# Patient Record
Sex: Female | Born: 1990
Health system: Southern US, Community
[De-identification: ages and names within clinical notes are randomized; demographics above are authoritative.]

## PROBLEM LIST (undated history)

## (undated) DIAGNOSIS — J069 Acute upper respiratory infection, unspecified: Secondary | ICD-10-CM

## (undated) DIAGNOSIS — Z8042 Family history of malignant neoplasm of prostate: Secondary | ICD-10-CM

## (undated) DIAGNOSIS — J09X2 Influenza due to identified novel influenza A virus with other respiratory manifestations: Secondary | ICD-10-CM

## (undated) DIAGNOSIS — F4310 Post-traumatic stress disorder, unspecified: Secondary | ICD-10-CM

## (undated) DIAGNOSIS — F909 Attention-deficit hyperactivity disorder, unspecified type: Secondary | ICD-10-CM

## (undated) DIAGNOSIS — M069 Rheumatoid arthritis, unspecified: Secondary | ICD-10-CM

## (undated) DIAGNOSIS — J302 Other seasonal allergic rhinitis: Secondary | ICD-10-CM

## (undated) DIAGNOSIS — Z800 Family history of malignant neoplasm of digestive organs: Secondary | ICD-10-CM

## (undated) DIAGNOSIS — L509 Urticaria, unspecified: Secondary | ICD-10-CM

## (undated) DIAGNOSIS — R42 Dizziness and giddiness: Secondary | ICD-10-CM

## (undated) DIAGNOSIS — Z808 Family history of malignant neoplasm of other organs or systems: Secondary | ICD-10-CM

## (undated) DIAGNOSIS — F32A Depression, unspecified: Secondary | ICD-10-CM

## (undated) DIAGNOSIS — F329 Major depressive disorder, single episode, unspecified: Secondary | ICD-10-CM

## (undated) DIAGNOSIS — L309 Dermatitis, unspecified: Secondary | ICD-10-CM

## (undated) DIAGNOSIS — G43909 Migraine, unspecified, not intractable, without status migrainosus: Secondary | ICD-10-CM

## (undated) DIAGNOSIS — Z803 Family history of malignant neoplasm of breast: Secondary | ICD-10-CM

## (undated) HISTORY — DX: Post-traumatic stress disorder, unspecified: F43.10

## (undated) HISTORY — DX: Acute upper respiratory infection, unspecified: J06.9

## (undated) HISTORY — DX: Depression, unspecified: F32.A

## (undated) HISTORY — DX: Urticaria, unspecified: L50.9

## (undated) HISTORY — DX: Dermatitis, unspecified: L30.9

## (undated) HISTORY — PX: ORIF DISTAL RADIUS FRACTURE: SUR927

## (undated) HISTORY — DX: Family history of malignant neoplasm of other organs or systems: Z80.8

## (undated) HISTORY — DX: Family history of malignant neoplasm of digestive organs: Z80.0

## (undated) HISTORY — PX: WISDOM TOOTH EXTRACTION: SHX21

## (undated) HISTORY — DX: Family history of malignant neoplasm of prostate: Z80.42

## (undated) HISTORY — DX: Family history of malignant neoplasm of breast: Z80.3

## (undated) HISTORY — DX: Influenza due to identified novel influenza A virus with other respiratory manifestations: J09.X2

## (undated) HISTORY — DX: Attention-deficit hyperactivity disorder, unspecified type: F90.9

## (undated) HISTORY — PX: OTHER SURGICAL HISTORY: SHX169

---
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
## 2008-08-05 ENCOUNTER — Emergency Department (HOSPITAL_COMMUNITY): Admission: EM | Admit: 2008-08-05 | Discharge: 2008-08-05 | Payer: Self-pay | Admitting: Family Medicine

## 2009-10-20 ENCOUNTER — Encounter: Admission: RE | Admit: 2009-10-20 | Discharge: 2009-10-20 | Payer: Self-pay | Admitting: Family Medicine

## 2009-11-25 ENCOUNTER — Emergency Department (HOSPITAL_COMMUNITY): Admission: EM | Admit: 2009-11-25 | Discharge: 2009-11-25 | Payer: Self-pay | Admitting: Family Medicine

## 2010-02-11 ENCOUNTER — Emergency Department (HOSPITAL_COMMUNITY)
Admission: EM | Admit: 2010-02-11 | Discharge: 2010-02-11 | Payer: Self-pay | Source: Home / Self Care | Admitting: Family Medicine

## 2010-09-11 LAB — CBC
HCT: 41.1 % (ref 36.0–46.0)
Hemoglobin: 13.2 g/dL (ref 12.0–15.0)
MCH: 27.8 pg (ref 26.0–34.0)
MCHC: 32.1 g/dL (ref 30.0–36.0)
MCV: 86.5 fL (ref 78.0–100.0)
Platelets: 305 10*3/uL (ref 150–400)
RBC: 4.75 MIL/uL (ref 3.87–5.11)
RDW: 12.9 % (ref 11.5–15.5)
WBC: 12.3 10*3/uL — ABNORMAL HIGH (ref 4.0–10.5)

## 2010-09-11 LAB — DIFFERENTIAL
Basophils Absolute: 0 10*3/uL (ref 0.0–0.1)
Basophils Relative: 0 % (ref 0–1)
Eosinophils Absolute: 0.1 10*3/uL (ref 0.0–0.7)
Eosinophils Relative: 1 % (ref 0–5)
Lymphocytes Relative: 24 % (ref 12–46)
Lymphs Abs: 2.9 10*3/uL (ref 0.7–4.0)
Monocytes Absolute: 0.7 10*3/uL (ref 0.1–1.0)
Monocytes Relative: 6 % (ref 3–12)
Neutro Abs: 8.5 10*3/uL — ABNORMAL HIGH (ref 1.7–7.7)
Neutrophils Relative %: 69 % (ref 43–77)

## 2010-09-11 LAB — POCT RAPID STREP A (OFFICE): Streptococcus, Group A Screen (Direct): NEGATIVE

## 2010-09-11 LAB — STREP A DNA PROBE: Group A Strep Probe: NEGATIVE

## 2010-09-11 LAB — POCT INFECTIOUS MONO SCREEN: Mono Screen: NEGATIVE

## 2010-10-13 LAB — GC/CHLAMYDIA PROBE AMP, GENITAL
Chlamydia, DNA Probe: NEGATIVE
GC Probe Amp, Genital: NEGATIVE

## 2010-10-13 LAB — WET PREP, GENITAL
Clue Cells Wet Prep HPF POC: NONE SEEN
Trich, Wet Prep: NONE SEEN
Yeast Wet Prep HPF POC: NONE SEEN

## 2010-10-13 LAB — POCT URINALYSIS DIP (DEVICE)
Bilirubin Urine: NEGATIVE
Glucose, UA: NEGATIVE mg/dL
Hgb urine dipstick: NEGATIVE
Ketones, ur: NEGATIVE mg/dL
Nitrite: NEGATIVE
Protein, ur: NEGATIVE mg/dL
Specific Gravity, Urine: 1.01 (ref 1.005–1.030)
Urobilinogen, UA: 0.2 mg/dL (ref 0.0–1.0)
pH: 7 (ref 5.0–8.0)

## 2010-10-13 LAB — POCT PREGNANCY, URINE: Preg Test, Ur: NEGATIVE

## 2011-03-27 ENCOUNTER — Inpatient Hospital Stay (INDEPENDENT_AMBULATORY_CARE_PROVIDER_SITE_OTHER)
Admission: RE | Admit: 2011-03-27 | Discharge: 2011-03-27 | Disposition: A | Discharge status: Home / Self Care | Payer: PRIVATE HEALTH INSURANCE | Source: Ambulatory Visit | Attending: Emergency Medicine | Admitting: Emergency Medicine

## 2011-03-27 ENCOUNTER — Ambulatory Visit (INDEPENDENT_AMBULATORY_CARE_PROVIDER_SITE_OTHER): Payer: PRIVATE HEALTH INSURANCE

## 2011-03-27 DIAGNOSIS — J189 Pneumonia, unspecified organism: Secondary | ICD-10-CM

## 2011-10-27 ENCOUNTER — Encounter: Payer: Self-pay | Admitting: Internal Medicine

## 2011-11-10 ENCOUNTER — Ambulatory Visit (INDEPENDENT_AMBULATORY_CARE_PROVIDER_SITE_OTHER): Payer: PRIVATE HEALTH INSURANCE | Admitting: Gynecology

## 2011-11-10 ENCOUNTER — Encounter: Payer: Self-pay | Admitting: Gynecology

## 2011-11-10 VITALS — BP 116/72 | Ht 60.5 in | Wt 123.5 lb

## 2011-11-10 DIAGNOSIS — N912 Amenorrhea, unspecified: Secondary | ICD-10-CM

## 2011-11-10 DIAGNOSIS — Z309 Encounter for contraceptive management, unspecified: Secondary | ICD-10-CM

## 2011-11-10 NOTE — Progress Notes (Signed)
Patient is a 21 year old recent grad from [employer] who presented to the office today to discuss different contraception options. She was interested in the nexplanon. She had been currently on the Aviane oral contraceptive pill but she states that she has issues with remembering. Patient is otherwise been in good health denies smoking or any prior history of STDs. Her Pap smear was done less than 6 months ago was reportedly normal.  The following contraceptive methods were discussed with this risk benefits pros and cons and failure rates: 1. Continues oral contraceptive pill 2. NuvaRing 3 Depo-Provera injection 4. Nexplanon 5. IUD (Mirena, [place], [place] T380A)  Literature information was provided on the nexplanon and she will wait to start of her next cycle to have it placed. She did come off of the oral contraceptive pill on the third day of her recent pack and was given by the provider at the [facility] Provera 10 mg for 5-7 days to start her cycle.

## 2011-11-10 NOTE — Patient Instructions (Signed)
Contraception Choices Contraception (birth control) is the use of any methods or devices to prevent pregnancy. Below are some methods to help avoid pregnancy. HORMONAL METHODS   Contraceptive implant. This is a thin, plastic tube containing progesterone hormone. It does not contain estrogen hormone. Your caregiver inserts the tube in the inner part of the upper arm. The tube can remain in place for up to 3 years. After 3 years, the implant must be removed. The implant prevents the ovaries from releasing an egg (ovulation), thickens the cervical mucus which prevents sperm from entering the uterus, and thins the lining of the inside of the uterus.   Progesterone-only injections. These injections are given every 3 months by your caregiver to prevent pregnancy. This synthetic progesterone hormone stops the ovaries from releasing eggs. It also thickens cervical mucus and changes the uterine lining. This makes it harder for sperm to survive in the uterus.   Birth control pills. These pills contain estrogen and progesterone hormone. They work by stopping the egg from forming in the ovary (ovulation). Birth control pills are prescribed by a caregiver.Birth control pills can also be used to treat heavy periods.   Minipill. This type of birth control pill contains only the progesterone hormone. They are taken every day of each month and must be prescribed by your caregiver.   Birth control patch. The patch contains hormones similar to those in birth control pills. It must be changed once a week and is prescribed by a caregiver.   Vaginal ring. The ring contains hormones similar to those in birth control pills. It is left in the vagina for 3 weeks, removed for 1 week, and then a new one is put back in place. The patient must be comfortable inserting and removing the ring from the vagina.A caregiver's prescription is necessary.   Emergency contraception. Emergency contraceptives prevent pregnancy after  unprotected sexual intercourse. This pill can be taken right after sex or up to 5 days after unprotected sex. It is most effective the sooner you take the pills after having sexual intercourse. Emergency contraceptive pills are available without a prescription. Check with your pharmacist. Do not use emergency contraception as your only form of birth control.  BARRIER METHODS   Female condom. This is a thin sheath (latex or rubber) that is worn over the penis during sexual intercourse. It can be used with spermicide to increase effectiveness.   Female condom. This is a soft, loose-fitting sheath that is put into the vagina before sexual intercourse.   Diaphragm. This is a soft, latex, dome-shaped barrier that must be fitted by a caregiver. It is inserted into the vagina, along with a spermicidal jelly. It is inserted before intercourse. The diaphragm should be left in the vagina for 6 to 8 hours after intercourse.   Cervical cap. This is a round, soft, latex or plastic cup that fits over the cervix and must be fitted by a caregiver. The cap can be left in place for up to 48 hours after intercourse.   Sponge. This is a soft, circular piece of polyurethane foam. The sponge has spermicide in it. It is inserted into the vagina after wetting it and before sexual intercourse.   Spermicides. These are chemicals that kill or block sperm from entering the cervix and uterus. They come in the form of creams, jellies, suppositories, foam, or tablets. They do not require a prescription. They are inserted into the vagina with an applicator before having sexual intercourse. The process must be   repeated every time you have sexual intercourse.  INTRAUTERINE CONTRACEPTION  Intrauterine device (IUD). This is a T-shaped device that is put in a woman's uterus during a menstrual period to prevent pregnancy. There are 2 types:   Copper IUD. This type of IUD is wrapped in copper wire and is placed inside the uterus. Copper  makes the uterus and fallopian tubes produce a fluid that kills sperm. It can stay in place for 10 years.   Hormone IUD. This type of IUD contains the hormone progestin (synthetic progesterone). The hormone thickens the cervical mucus and prevents sperm from entering the uterus, and it also thins the uterine lining to prevent implantation of a fertilized egg. The hormone can weaken or kill the sperm that get into the uterus. It can stay in place for 5 years.  PERMANENT METHODS OF CONTRACEPTION  Female tubal ligation. This is when the woman's fallopian tubes are surgically sealed, tied, or blocked to prevent the egg from traveling to the uterus.   Female sterilization. This is when the female has the tubes that carry sperm tied off (vasectomy).This blocks sperm from entering the vagina during sexual intercourse. After the procedure, the man can still ejaculate fluid (semen).  NATURAL PLANNING METHODS  Natural family planning. This is not having sexual intercourse or using a barrier method (condom, diaphragm, cervical cap) on days the woman could become pregnant.   Calendar method. This is keeping track of the length of each menstrual cycle and identifying when you are fertile.   Ovulation method. This is avoiding sexual intercourse during ovulation.   Symptothermal method. This is avoiding sexual intercourse during ovulation, using a thermometer and ovulation symptoms.   Post-ovulation method. This is timing sexual intercourse after you have ovulated.  Regardless of which type or method of contraception you choose, it is important that you use condoms to protect against the transmission of sexually transmitted diseases (STDs). Talk with your caregiver about which form of contraception is most appropriate for you. Document Released: 06/14/2005 Document Revised: 06/03/2011 Document Reviewed: 10/21/2010 [employer] Patient Information 2012 [employer]. 

## 2011-11-18 ENCOUNTER — Telehealth: Payer: Self-pay | Admitting: *Deleted

## 2011-11-18 NOTE — Telephone Encounter (Signed)
Pt called asking if all right not to have a period 5 days after finishing provera. I advised to give it 2 weeks from last pill to see if it comes. Pt will c/b if still no period. KW

## 2012-05-23 ENCOUNTER — Emergency Department: Payer: Self-pay | Admitting: Emergency Medicine

## 2013-02-06 ENCOUNTER — Other Ambulatory Visit (HOSPITAL_COMMUNITY)
Admission: RE | Admit: 2013-02-06 | Discharge: 2013-02-06 | Disposition: A | Discharge status: Home / Self Care | Payer: BC Managed Care – PPO | Source: Ambulatory Visit | Attending: Obstetrics and Gynecology | Admitting: Obstetrics and Gynecology

## 2013-02-06 ENCOUNTER — Other Ambulatory Visit: Payer: Self-pay | Admitting: Nurse Practitioner

## 2013-02-06 DIAGNOSIS — Z01419 Encounter for gynecological examination (general) (routine) without abnormal findings: Secondary | ICD-10-CM | POA: Insufficient documentation

## 2013-03-04 ENCOUNTER — Emergency Department
Admission: EM | Admit: 2013-03-04 | Discharge: 2013-03-04 | Disposition: A | Discharge status: Home / Self Care | Payer: BC Managed Care – PPO | Source: Home / Self Care | Attending: Family Medicine | Admitting: Family Medicine

## 2013-03-04 ENCOUNTER — Encounter: Payer: Self-pay | Admitting: *Deleted

## 2013-03-04 DIAGNOSIS — J069 Acute upper respiratory infection, unspecified: Secondary | ICD-10-CM

## 2013-03-04 DIAGNOSIS — R062 Wheezing: Secondary | ICD-10-CM

## 2013-03-04 LAB — POCT RAPID STREP A (OFFICE): Rapid Strep A Screen: NEGATIVE

## 2013-03-04 MED ORDER — METHYLPREDNISOLONE ACETATE 80 MG/ML IJ SUSP
80.0000 mg | Freq: Once | INTRAMUSCULAR | Status: AC
  Administered 2013-03-04: 80 mg via INTRAMUSCULAR

## 2013-03-04 MED ORDER — ALBUTEROL SULFATE HFA 108 (90 BASE) MCG/ACT IN AERS: 2.0000 | INHALATION_SPRAY | RESPIRATORY_TRACT | Status: DC | PRN

## 2013-03-04 NOTE — ED Notes (Signed)
Patient c/o sore throat, nasal congestion, and body aches x 2 days. Has tried OTC Benadryl, Ibuprofen, Nyquil and Dayquil with no relief.

## 2013-03-04 NOTE — ED Provider Notes (Signed)
CSN: [id number]     Arrival date & time 03/04/13  1507 History   First MD Initiated Contact with Patient 03/04/13 1527     Chief Complaint  Patient presents with  . Nasal Congestion  . Sore Throat    HPI  URI Symptoms Onset: 2-3 days  Description: rhinorrhea, nasal congestion, cough, wheezing  Modifying factors:  Baseline asthma, feels like she may need to use her inhaler  Symptoms Nasal discharge: yes Fever: mp Sore throat: yes Cough: yes Wheezing: yes Ear pain: no GI symptoms: no Sick contacts: no  Red Flags  Stiff neck: no Dyspnea: chest tightness Rash: no Swallowing difficulty: no  Sinusitis Risk Factors Headache/face pain: no Double sickening: no tooth pain: no  Allergy Risk Factors Sneezing: no Itchy scratchy throat: no Seasonal symptoms: no  Flu Risk Factors Headache: no muscle aches: no severe fatigue: no    Past Medical History  Diagnosis Date  . Swine flu 2009  . Asthma    Past Surgical History  Procedure Laterality Date  . Appendectomy  1996   Family History  Problem Relation Age of Onset  . Arrhythmia Brother     half-brother  . Diabetes Maternal Aunt   . Cancer Maternal Grandmother     SKIN  . Cancer Maternal Grandfather     SKIN  . Hypertension Paternal Grandmother   . Hypertension Paternal Grandfather   . Cancer Paternal Grandfather     MELANOMA   History  Substance Use Topics  . Smoking status: Current Some Day Smoker  . Smokeless tobacco: Never Used  . Alcohol Use: Yes     Comment: rare   OB History   Grav Para Term Preterm Abortions TAB SAB Ect Mult Living   0              Review of Systems  All other systems reviewed and are negative.    Allergies  Azithromycin  Home Medications   Current Outpatient Rx  Name  Route  Sig  Dispense  Refill  . albuterol (PROVENTIL) (2.5 MG/3ML) 0.083% nebulizer solution   Nebulization   Take 2.5 mg by nebulization every 6 (six) hours as needed for wheezing.         Marland Kitchen  amphetamine-dextroamphetamine (ADDERALL XR) 10 MG 24 hr capsule   Oral   Take 10 mg by mouth every morning.         . etonogestrel (NEXPLANON) 68 MG IMPL implant   Subcutaneous   Inject 1 each into the skin once.         Marland Kitchen levonorgestrel-ethinyl estradiol (AVIANE,ALESSE,LESSINA) 0.1-20 MG-MCG tablet   Oral   Take 1 tablet by mouth daily.          BP 101/66  Pulse 67  Temp(Src) 98 F (36.7 C) (Oral)  Resp 16  Ht 5\' 1"  (1.549 m)  Wt 121 lb (54.885 kg)  BMI 22.87 kg/m2  SpO2 98% Physical Exam  Constitutional: She appears well-developed and well-nourished.  HENT:  Head: Normocephalic and atraumatic.  Right Ear: External ear normal.  Left Ear: External ear normal.  +nasal erythema, rhinorrhea bilaterally, + post oropharyngeal erythema    Eyes: Conjunctivae are normal. Pupils are equal, round, and reactive to light.  Neck: Normal range of motion. Neck supple.  Cardiovascular: Normal rate, regular rhythm and normal heart sounds.   Pulmonary/Chest: Effort normal and breath sounds normal.  Abdominal: Soft. Bowel sounds are normal.  Musculoskeletal: Normal range of motion.  Neurological: She is alert.  Skin:  Skin is warm.    ED Course  Procedures (including critical care time) Labs Review Labs Reviewed  POCT RAPID STREP A (OFFICE)   Imaging Review No results found.  MDM   1. URI (upper respiratory infection)   2. Wheezing    Likely viral process  Rapid strep negative.  Depomedrol 80mg  IMx1 for wheezing Albuterol inhaler refilled.  Discussed supportive care and infectious/resp red flags.  Follow up as needed.    The patient and/or caregiver has been counseled thoroughly with regard to treatment plan and/or medications prescribed including dosage, schedule, interactions, rationale for use, and possible side effects and they verbalize understanding. Diagnoses and expected course of recovery discussed and will return if not improved as expected or if the  condition worsens. Patient and/or caregiver verbalized understanding.         Doree Albee, MD 03/04/13 [phone]

## 2013-05-07 ENCOUNTER — Emergency Department
Admission: EM | Admit: 2013-05-07 | Discharge: 2013-05-07 | Disposition: A | Discharge status: Home / Self Care | Payer: BC Managed Care – PPO | Source: Home / Self Care | Attending: Family Medicine | Admitting: Family Medicine

## 2013-05-07 ENCOUNTER — Encounter: Payer: Self-pay | Admitting: Emergency Medicine

## 2013-05-07 DIAGNOSIS — R1031 Right lower quadrant pain: Secondary | ICD-10-CM

## 2013-05-07 DIAGNOSIS — R1032 Left lower quadrant pain: Secondary | ICD-10-CM

## 2013-05-07 DIAGNOSIS — G8929 Other chronic pain: Secondary | ICD-10-CM

## 2013-05-07 DIAGNOSIS — Z0000 Encounter for general adult medical examination without abnormal findings: Secondary | ICD-10-CM

## 2013-05-07 LAB — POCT URINALYSIS DIP (MANUAL ENTRY)
Bilirubin, UA: NEGATIVE
Blood, UA: NEGATIVE
Glucose, UA: NEGATIVE
Ketones, POC UA: NEGATIVE
Nitrite, UA: NEGATIVE
Protein Ur, POC: NEGATIVE
Spec Grav, UA: 1.015 (ref 1.005–1.03)
Urobilinogen, UA: 0.2 (ref 0–1)
pH, UA: 7 (ref 5–8)

## 2013-05-07 LAB — POCT CBC W AUTO DIFF (K'VILLE URGENT CARE)

## 2013-05-07 LAB — POCT URINE PREGNANCY: Preg Test, Ur: NEGATIVE

## 2013-05-07 MED ORDER — INFLUENZA VAC SPLIT QUAD 0.5 ML IM SUSP
0.5000 mL | Freq: Once | INTRAMUSCULAR | Status: AC
  Administered 2013-05-07: 0.5 mL via INTRAMUSCULAR

## 2013-05-07 NOTE — ED Notes (Signed)
Barbara Sellers c/o intermittent pain that starts low in her back  And radiates to her colon. She describes it as a "tearing" feeling. This started about 6 years ago but only occurred about once every 6 months. It has become more frequent over time and is now occuring about 1/week. The pain is "debilitating" when it occurs, about a 9/10. She denies any blood in her stools, BM are regular. She lifts weights but this started before she ever started lifting weights.

## 2013-05-07 NOTE — ED Provider Notes (Addendum)
CSN: [id number]     Arrival date & time 05/07/13  1531 History   First MD Initiated Contact with Patient 05/07/13 1717     Chief Complaint  Patient presents with  . GI Problem      HPI Comments: Patient complains of a 6 year history of randomly occurring abdominal pain which she experiences in her colon.  Initially the pain was brief and described as "tearing."  The pain has gradually increased in frequency and intensity.  For the past 3 days her pain has generally been constant.  When the pain is most severe she has nausea, but no vomiting.  She is unable to eat when the pain occurs, and nothing relieves the pain.  The pain often awakens her from sleep.  No fever, but she has had chills during the past 3 days.  When the pain occurs she is unable to perform any physical activities.  There have been no changes in her bowel movements.  No urinary symptoms.  She states that she has had about 6 pound weight loss over the past two weeks because she does not feel like eating when she has the pain. She presently has the Nexplanon implant which was placed about a month ago.  No vaginal discharge.  No recent vaginal bleeding.  She states that her GYN exams have been normal. Past surgical history:  Appendectomy Family history:  Father has IBS, and also had colonic polyps resected 6 years ago.  Patient is a 22 y.o. female presenting with abdominal pain. The history is provided by the patient.  Abdominal Pain This is a chronic problem. Episode onset: 6 years ago. The problem occurs daily. The problem has been gradually worsening. Associated symptoms include abdominal pain. Pertinent negatives include no chest pain, no headaches and no shortness of breath. The symptoms are aggravated by eating and exertion. Nothing relieves the symptoms. She has tried nothing for the symptoms.    Past Medical History  Diagnosis Date  . Swine flu 2009  . Asthma    Past Surgical History  Procedure Laterality Date  .  Appendectomy  1996   Family History  Problem Relation Age of Onset  . Arrhythmia Brother     half-brother  . Diabetes Maternal Aunt   . Cancer Maternal Grandmother     SKIN  . Cancer Maternal Grandfather     SKIN  . Hypertension Paternal Grandmother   . Hypertension Paternal Grandfather   . Cancer Paternal Grandfather     MELANOMA  . Colon polyps Father    History  Substance Use Topics  . Smoking status: Never Smoker   . Smokeless tobacco: Never Used  . Alcohol Use: Yes     Comment: rare   OB History   Grav Para Term Preterm Abortions TAB SAB Ect Mult Living   0              Review of Systems  Respiratory: Negative for shortness of breath.   Cardiovascular: Negative for chest pain.  Gastrointestinal: Positive for nausea and abdominal pain. Negative for vomiting, diarrhea, constipation, blood in stool, abdominal distention, anal bleeding and rectal pain.  Genitourinary: Negative for dysuria, urgency, frequency, vaginal discharge and vaginal pain.  Neurological: Negative for headaches.  All other systems reviewed and are negative.    Allergies  Azithromycin and Cayenne pepper  Home Medications   Current Outpatient Rx  Name  Route  Sig  Dispense  Refill  . albuterol (PROVENTIL HFA;VENTOLIN HFA) 108 (90 BASE)  MCG/ACT inhaler   Inhalation   Inhale 2 puffs into the lungs every 4 (four) hours as needed for wheezing (cough, shortness of breath or wheezing.).   1 Inhaler   1   . amphetamine-dextroamphetamine (ADDERALL XR) 10 MG 24 hr capsule   Oral   Take 10 mg by mouth every morning.         . etonogestrel (NEXPLANON) 68 MG IMPL implant   Subcutaneous   Inject 1 each into the skin once.          BP 105/66  Pulse 69  Temp(Src) 98 F (36.7 C) (Oral)  Resp 14  Ht 5\' 1"  (1.549 m)  Wt 119 lb (53.978 kg)  BMI 22.50 kg/m2  SpO2 99% Physical Exam Nursing notes and Vital Signs reviewed. Appearance:  Patient appears healthy, stated age, and in no acute  distress Eyes:  Pupils are equal, round, and reactive to light and accomodation.  Extraocular movement is intact.  Conjunctivae are not inflamed   Nose:  Normal turbinates.  Pharynx:  Normal Neck:  Supple.  No adenopathy Lungs:  Clear to auscultation.  Breath sounds are equal.  Heart:  Regular rate and rhythm without murmurs, rubs, or gallops.  Abdomen:   Tenderness over descending colon without masses or hepatosplenomegaly.  Bowel sounds are present and increased.  No CVA or flank tenderness.  Extremities:  No edema.  No calf tenderness Skin:  No rash present.   ED Course  Procedures      Labs Reviewed  URINE CULTURE No growth  AMYLASE 37 WNL  LIPASE 21 WNL  TSH 1.22 WNL  COMPLETE METABOLIC PANEL WITH GFR  POCT CBC W AUTO DIFF ([facility])  WBC 9.1; LY 45.5; MO 8.8; GR 45.7; Hgb 13.8; Platelets 236   POCT URINALYSIS DIP (MANUAL ENTRY):  LEU Trace, otherwise negative  POCT URINE PREGNANCY negative   Imaging Review Ct Abdomen Pelvis W Contrast  05/08/2013   CLINICAL DATA:  Lower abdominal pain, prior appendectomy  EXAM: CT ABDOMEN AND PELVIS WITH CONTRAST  TECHNIQUE: Multidetector CT imaging of the abdomen and pelvis was performed using the standard protocol following bolus administration of intravenous contrast.  CONTRAST:  OMNIPAQUE IOHEXOL 300 MG/ML  SOLN  COMPARISON:  None.  FINDINGS: Lung bases are clear.  Liver, spleen, pancreas, and adrenal glands are within normal limits.  Gallbladder is unremarkable. No intrahepatic or extrahepatic ductal dilatation.  Kidneys are within normal limits.  No hydronephrosis.  No evidence of bowel obstruction. Prior appendectomy. Left colon is decompressed.  No evidence of abdominal aortic aneurysm. Circumaortic left renal vein.  No abdominopelvic ascites.  No suspicious abdominopelvic lymphadenopathy.  Uterus and bilateral ovaries are unremarkable.  Bladder is within normal limits.  Visualized osseous structures are within normal limits.   IMPRESSION: Prior appendectomy.  Negative CT abdomen pelvis.   Electronically Signed   By: Charline Bills M.D.   On: 05/08/2013 16:00      MDM   1. Preventative health care:  Influenza vaccination   2. Abdominal pain, chronic, bilateral lower quadrant.  Normal white blood count is reassuring.  Note family history of colonic polyps    CMP, amylase, lipase, urine culture pending   Followup in 48 hours.     Lattie Haw, MD 05/09/13 [phone]  Addendum:  All studies WNL Suspect atypical irritable bowel syndrome.  However nocturnal pain and weight loss are worrisome.  Recommend colonoscopy. In interim, will begin cautious trial of low dose Paxil, 10mg  at  bedtime. Will arrange referral to [facility] in [place] for further evaluation  Lattie Haw, MD 05/09/13 [phone]

## 2013-05-08 ENCOUNTER — Ambulatory Visit (INDEPENDENT_AMBULATORY_CARE_PROVIDER_SITE_OTHER): Payer: BC Managed Care – PPO

## 2013-05-08 ENCOUNTER — Other Ambulatory Visit: Payer: Self-pay | Admitting: Family Medicine

## 2013-05-08 ENCOUNTER — Telehealth: Payer: Self-pay | Admitting: *Deleted

## 2013-05-08 DIAGNOSIS — R109 Unspecified abdominal pain: Secondary | ICD-10-CM

## 2013-05-08 LAB — COMPLETE METABOLIC PANEL WITH GFR
ALT: 13 U/L (ref 0–35)
AST: 20 U/L (ref 0–37)
Albumin: 5.1 g/dL (ref 3.5–5.2)
Alkaline Phosphatase: 67 U/L (ref 39–117)
BUN: 13 mg/dL (ref 6–23)
CO2: 30 mEq/L (ref 19–32)
Calcium: 10.3 mg/dL (ref 8.4–10.5)
Chloride: 101 mEq/L (ref 96–112)
Creat: 0.67 mg/dL (ref 0.50–1.10)
GFR, Est African American: >89 mL/min
GFR, Est Non African American: >89 mL/min
Glucose, Bld: 97 mg/dL (ref 70–99)
Potassium: 4.3 mEq/L (ref 3.5–5.3)
Sodium: 138 mEq/L (ref 135–145)
Total Bilirubin: 0.5 mg/dL (ref 0.3–1.2)
Total Protein: 7.9 g/dL (ref 6.0–8.3)

## 2013-05-08 LAB — TSH: TSH: 2.122 u[IU]/mL (ref 0.350–4.500)

## 2013-05-08 LAB — LIPASE: Lipase: 21 U/L (ref 0–75)

## 2013-05-08 LAB — AMYLASE: Amylase: 37 U/L (ref 0–105)

## 2013-05-08 MED ORDER — IOHEXOL 300 MG/ML  SOLN
100.0000 mL | Freq: Once | INTRAMUSCULAR | Status: AC | PRN
  Administered 2013-05-08: 100 mL via INTRAVENOUS

## 2013-05-09 ENCOUNTER — Telehealth: Payer: Self-pay | Admitting: *Deleted

## 2013-05-09 LAB — URINE CULTURE: Organism ID, Bacteria: NO GROWTH

## 2013-05-09 MED ORDER — PAROXETINE HCL 10 MG PO TABS: ORAL_TABLET | ORAL | Status: DC

## 2013-09-27 ENCOUNTER — Other Ambulatory Visit: Payer: Self-pay | Admitting: Emergency Medicine

## 2013-09-27 ENCOUNTER — Emergency Department
Admission: EM | Admit: 2013-09-27 | Discharge: 2013-09-27 | Disposition: A | Discharge status: Home / Self Care | Payer: BC Managed Care – PPO | Source: Home / Self Care | Attending: Emergency Medicine | Admitting: Emergency Medicine

## 2013-09-27 ENCOUNTER — Encounter: Payer: Self-pay | Admitting: Emergency Medicine

## 2013-09-27 DIAGNOSIS — J111 Influenza due to unidentified influenza virus with other respiratory manifestations: Secondary | ICD-10-CM

## 2013-09-27 DIAGNOSIS — J039 Acute tonsillitis, unspecified: Secondary | ICD-10-CM

## 2013-09-27 DIAGNOSIS — R509 Fever, unspecified: Secondary | ICD-10-CM

## 2013-09-27 DIAGNOSIS — J45909 Unspecified asthma, uncomplicated: Secondary | ICD-10-CM

## 2013-09-27 LAB — POCT RAPID STREP A (OFFICE): Rapid Strep A Screen: NEGATIVE

## 2013-09-27 LAB — POCT INFLUENZA A/B
Influenza A, POC: NEGATIVE
Influenza B, POC: NEGATIVE

## 2013-09-27 MED ORDER — AMOXICILLIN-POT CLAVULANATE 875-125 MG PO TABS: 1.0000 | ORAL_TABLET | Freq: Two times a day (BID) | ORAL | Status: DC

## 2013-09-27 MED ORDER — CEFTRIAXONE SODIUM 1 G IJ SOLR
1.0000 g | INTRAMUSCULAR | Status: AC
  Administered 2013-09-27: 1 g via INTRAMUSCULAR

## 2013-09-27 MED ORDER — ONDANSETRON 4 MG PO TBDP
4.0000 mg | ORAL_TABLET | ORAL | Status: AC
  Administered 2013-09-27: 4 mg via ORAL

## 2013-09-27 MED ORDER — ONDANSETRON 4 MG PO TBDP: 4.0000 mg | ORAL_TABLET | Freq: Three times a day (TID) | ORAL | Status: DC | PRN

## 2013-09-27 MED ORDER — OSELTAMIVIR PHOSPHATE 75 MG PO CAPS: ORAL_CAPSULE | ORAL | Status: DC

## 2013-09-27 MED ORDER — ALBUTEROL SULFATE HFA 108 (90 BASE) MCG/ACT IN AERS: 2.0000 | INHALATION_SPRAY | RESPIRATORY_TRACT | Status: DC | PRN

## 2013-09-27 MED ORDER — OSELTAMIVIR PHOSPHATE 75 MG PO CAPS
ORAL_CAPSULE | ORAL | Status: DC
Start: 2013-09-27 — End: 2013-09-27

## 2013-09-27 NOTE — ED Notes (Signed)
Barbara Sellers c/o body aches, sore throat, presenting with white spots on tonsils, cough and fever since yesterday. She has hx of ashtma and has had to use inhaler. Rec'd flu vac this season.

## 2013-09-27 NOTE — ED Provider Notes (Signed)
CSN: [id number]     Arrival date & time 09/27/13  1219 History   First MD Initiated Contact with Patient 09/27/13 1242     Chief Complaint  Patient presents with  . Sore Throat  . Generalized Body Aches    HPI FLU  HPI : Acute Flu symptoms for 1 day. Fever to 102 with chills, sweats, myalgias, fatigue, headache. Symptoms are progressively worsening, despite trying OTC fever reducing medicine and rest and fluids. Has decreased appetite, but tolerating some liquids by mouth. No history of recent tick bite. She has hx of mild asthma, with only occasional use for rescue inhaler as her baseline. Today, she had to use inhaler because of mild wheezing. Rec'd flu vac this season.   Review of Systems: Positive for fatigue, mild nasal congestion, severe sore throat, severe swollen anterior neck glands, +cough, occasionally productive of brown sputum. + severe nausea. + 2 loose, nonbloody stools today. Negative for acute vision changes, stiff neck, focal weakness, syncope, seizures, respiratory distress, vomiting, GU symptoms, new Rash.    Past Medical History  Diagnosis Date  . Swine flu 2009  . Asthma    Past Surgical History  Procedure Laterality Date  . Appendectomy  1996   Family History  Problem Relation Age of Onset  . Arrhythmia Brother     half-brother  . Diabetes Maternal Aunt   . Cancer Maternal Grandmother     SKIN  . Cancer Maternal Grandfather     SKIN  . Hypertension Paternal Grandmother   . Hypertension Paternal Grandfather   . Cancer Paternal Grandfather     MELANOMA  . Colon polyps Father    History  Substance Use Topics  . Smoking status: Never Smoker   . Smokeless tobacco: Never Used  . Alcohol Use: Yes     Comment: rare   OB History   Grav Para Term Preterm Abortions TAB SAB Ect Mult Living   0              Review of Systems  All other systems reviewed and are negative.    Allergies  Azithromycin and Cayenne pepper  Home Medications   Current  Outpatient Rx  Name  Route  Sig  Dispense  Refill  . albuterol (PROVENTIL HFA;VENTOLIN HFA) 108 (90 BASE) MCG/ACT inhaler   Inhalation   Inhale 2 puffs into the lungs every 4 (four) hours as needed for wheezing (cough, shortness of breath or wheezing.).   1 Inhaler   1   . amphetamine-dextroamphetamine (ADDERALL XR) 10 MG 24 hr capsule   Oral   Take 10 mg by mouth every morning.         . etonogestrel (NEXPLANON) 68 MG IMPL implant   Subcutaneous   Inject 1 each into the skin once.         Marland Kitchen. PARoxetine (PAXIL) 10 MG tablet      Take one tab by mouth daily at bedtime   15 tablet   1    BP 108/67  Pulse 97  Temp(Src) 100.5 F (38.1 C) (Oral)  Resp 14  Wt 119 lb (53.978 kg)  SpO2 96% Physical Exam  Nursing note and vitals reviewed. Constitutional: She appears well-developed and well-nourished.  Non-toxic appearance. She appears ill (very fatigued, but no cardiorespiratory distress). No distress.  HENT:  Head: Normocephalic and atraumatic.  Right Ear: Tympanic membrane and external ear normal.  Left Ear: Tympanic membrane and external ear normal.  Nose: Rhinorrhea present.  Mouth/Throat: Mucous membranes  are normal. No uvula swelling. Oropharyngeal exudate and posterior oropharyngeal erythema (mild redness ) present. No posterior oropharyngeal edema.  Tonsils 2+ enlarged, red, whitish yellow exudate. No fluctuance or trismus. Airway intact.  Eyes: Conjunctivae are normal. Right eye exhibits no discharge. Left eye exhibits no discharge. No scleral icterus.  Neck: Neck supple.  Neck is supple. Tender enlarged bilateral anterior cervical nodes.  Cardiovascular: Normal rate, regular rhythm and normal heart sounds.   Pulmonary/Chest: No stridor. No respiratory distress. She has no decreased breath sounds. She has wheezes (Rare late expiratory). She has rhonchi (Few scattered). She has no rales.  Abdominal: Soft. There is no tenderness.  Musculoskeletal: She exhibits no  edema.  Lymphadenopathy:    She has cervical adenopathy (mild shoddy anterior cervical nodes).  Neurological: She is alert.  Skin: Skin is warm and intact. No rash noted. She is diaphoretic.  Psychiatric: She has a normal mood and affect.    ED Course  Procedures (including critical care time) Labs Review Labs Reviewed  STREP A DNA PROBE  INFLUENZA PANEL BY PCR (TYPE A & B, H1N1)  POCT RAPID STREP A (OFFICE)  POCT INFLUENZA A/B   Results for orders placed during the hospital encounter of 09/27/13  POCT RAPID STREP A (OFFICE)      Result Value Ref Range   Rapid Strep A Screen Negative  Negative  POCT INFLUENZA A/B      Result Value Ref Range   Influenza A, POC Negative     Influenza B, POC Negative      Imaging Review No results found.  I advised chest x-ray, but she declined MDM   1. Tonsillitis with exudate   2. Fever, unspecified   3. Influenza with other respiratory manifestations   4. Asthma, chronic    although rapid strep and rapid flu test negative, she has severe exudative tonsillitis and clinically has classical influenza and will treat as such pending tests sent to reference lab for flu and strep culture. Treatment options discussed, as well as risks, benefits, alternatives. Patient voiced understanding and agreement with the following plans: Rocephin 1 g IM stat Zofran ODT 4 mg by mouth stat, and then improved her nausea. Ibuprofen 400 mg stat, and that improved her myalgias and fever somewhat.  Prescriptions: Augmentin 875 twice a day Tamiflu twice a day x5 days Zofran ODT as needed for nausea I refilled albuterol HFA when necessary wheezing Other symptomatic care discussed. An After VisitSummary was printed and given to the patient.-- Please see AVS for further details  Follow-up with your primary care doctor in 2 days if not improving, or sooner if symptoms become worse. Precautions discussed. Red flags discussed. Questions invited and  answered. Patient voiced understanding and agreement.      Lajean Manes, MD 09/27/13 [phone]

## 2013-09-27 NOTE — Discharge Instructions (Signed)
Tests done today in urgent care: Rapid strep test negative. This can have false negatives, so We sent off strep culture to reference lab, results will be back within 2 days. Rapid flu test: Negative. This can have false negatives, so we are sending the reference lab flu test,results will be back within 2 days.  To jump start you with antibiotic, shot of Rocephin 1 g given today. 1 Zofran by mouth given today for nausea Prescriptions sent to your pharmacy : Take the Augmentin antibiotic as prescribed. Take the Tamiflu as prescribed. Use the Zofran every 8 hours if needed for nausea. Albuterol inhaler, use every 4-6 hours if needed for wheezing.  Rest and drink plenty of fluids. Tylenol or ibuprofen for fever or pain  Follow-up here in Urgent Care or with your primary care doctor in 2-3 days if not improving, or sooner if symptoms become worse. Go to emergency room immediately if any severe worsening symptoms

## 2013-09-28 LAB — STREP A DNA PROBE: GASP: NEGATIVE

## 2013-09-30 LAB — INFLUENZA A & B PCR
Influenza A: NOT DETECTED
Influenza B: NOT DETECTED

## 2013-10-05 ENCOUNTER — Telehealth: Payer: Self-pay | Admitting: *Deleted

## 2014-07-07 ENCOUNTER — Emergency Department
Admission: EM | Admit: 2014-07-07 | Discharge: 2014-07-07 | Disposition: A | Discharge status: Home / Self Care | Payer: BLUE CROSS/BLUE SHIELD | Source: Home / Self Care | Attending: Family Medicine | Admitting: Family Medicine

## 2014-07-07 DIAGNOSIS — J040 Acute laryngitis: Secondary | ICD-10-CM

## 2014-07-07 MED ORDER — PREDNISONE 10 MG PO TABS: 30.0000 mg | ORAL_TABLET | Freq: Every day | ORAL | Status: DC

## 2014-07-07 MED ORDER — PROMETHAZINE-CODEINE 6.25-10 MG/5ML PO SYRP: 5.0000 mL | ORAL_SOLUTION | Freq: Every evening | ORAL | Status: DC | PRN

## 2014-07-07 MED ORDER — ALBUTEROL SULFATE HFA 108 (90 BASE) MCG/ACT IN AERS: 1.0000 | INHALATION_SPRAY | Freq: Four times a day (QID) | RESPIRATORY_TRACT | Status: DC | PRN

## 2014-07-07 MED ORDER — IPRATROPIUM BROMIDE 0.06 % NA SOLN: 2.0000 | Freq: Four times a day (QID) | NASAL | Status: DC

## 2014-07-07 NOTE — ED Provider Notes (Signed)
Ardelle BallsMegan Speros is a 24 y.o. female who presents to Urgent Care today for her throat and hoarse voice cough and congestion. Symptoms present for one week. Patient additionally has ear pain and nasal discharge with mild headache. She has tried some over-the-counter medications which have helped.    Past Medical History  Diagnosis Date  . Swine flu 2009  . Asthma    Past Surgical History  Procedure Laterality Date  . Appendectomy  1996   History  Substance Use Topics  . Smoking status: Never Smoker   . Smokeless tobacco: Never Used  . Alcohol Use: Yes     Comment: rare   ROS as above Medications: No current facility-administered medications for this encounter.   Current Outpatient Prescriptions  Medication Sig Dispense Refill  . albuterol (PROVENTIL HFA;VENTOLIN HFA) 108 (90 BASE) MCG/ACT inhaler Inhale 1-2 puffs into the lungs every 6 (six) hours as needed for wheezing or shortness of breath. 1 Inhaler 0  . etonogestrel (NEXPLANON) 68 MG IMPL implant Inject 1 each into the skin once.    Marland Kitchen. ipratropium (ATROVENT) 0.06 % nasal spray Place 2 sprays into both nostrils 4 (four) times daily. 15 mL 1  . predniSONE (DELTASONE) 10 MG tablet Take 3 tablets (30 mg total) by mouth daily. 15 tablet 0  . promethazine-codeine (PHENERGAN WITH CODEINE) 6.25-10 MG/5ML syrup Take 5 mLs by mouth at bedtime as needed for cough. 120 mL 0  . [DISCONTINUED] amphetamine-dextroamphetamine (ADDERALL XR) 10 MG 24 hr capsule Take 10 mg by mouth every morning.    . [DISCONTINUED] PARoxetine (PAXIL) 10 MG tablet Take one tab by mouth daily at bedtime 15 tablet 1   Allergies  Allergen Reactions  . Azithromycin Itching    Also burning.  Revonda Humphrey. Cayenne Pepper [Cayenne]      Exam:  BP 113/73 mmHg  Pulse 62  Temp(Src) 98.1 F (36.7 C) (Oral)  Ht 5\' 1"  (1.549 m)  Wt 131 lb 8 oz (59.648 kg)  BMI 24.86 kg/m2  SpO2 97%  LMP 05/30/2014 Gen: Well NAD nontoxic appearing HEENT: EOMI,  MMM normal tympanic membranes  bilaterally. Posterior pharynx with cobblestoning. Lungs: Normal work of breathing. CTABL hoarse voice Heart: RRR no MRG Abd: NABS, Soft. Nondistended, Nontender Exts: Brisk capillary refill, warm and well perfused.   No results found for this or any previous visit (from the past 24 hour(s)). No results found.  Assessment and Plan: 24 y.o. female with laryngitis. Patient also has a history of asthma. Treatment with prednisone and Atrovent nasal spray. Additionally will use codeine for cough medication and refill albuterol. Follow-up with PCP as needed.  Discussed warning signs or symptoms. Please see discharge instructions. Patient expresses understanding.     Rodolph BongEvan S Caspar Favila, MD 07/07/14 [phone]

## 2014-07-07 NOTE — Discharge Instructions (Signed)
Thank you for coming in today. Call or go to the emergency room if you get worse, have trouble breathing, have chest pains, or palpitations.  Do not drive after taking codeine   Laryngitis At the top of your windpipe is your voice box. It is the source of your voice. Inside your voice box are 2 bands of muscles called vocal cords. When you breathe, your vocal cords are relaxed and open so that air can get into the lungs. When you decide to say something, these cords come together and vibrate. The sound from these vibrations goes into your throat and comes out through your mouth as sound. Laryngitis is an inflammation of the vocal cords that causes hoarseness, cough, loss of voice, sore throat, and dry throat. Laryngitis can be temporary (acute) or long-term (chronic). Most cases of acute laryngitis improve with time.Chronic laryngitis lasts for more than 3 weeks. CAUSES Laryngitis can often be related to excessive smoking, talking, or yelling, as well as inhalation of toxic fumes and allergies. Acute laryngitis is usually caused by a viral infection, vocal strain, measles or mumps, or bacterial infections. Chronic laryngitis is usually caused by vocal cord strain, vocal cord injury, postnasal drip, growths on the vocal cords, or acid reflux. SYMPTOMS   Cough.  Sore throat.  Dry throat. RISK FACTORS  Respiratory infections.  Exposure to irritating substances, such as cigarette smoke, excessive amounts of alcohol, stomach acids, and workplace chemicals.  Voice trauma, such as vocal cord injury from shouting or speaking too loud. DIAGNOSIS  Your cargiver will perform a physical exam. During the physical exam, your caregiver will examine your throat. The most common sign of laryngitis is hoarseness. Laryngoscopy may be necessary to confirm the diagnosis of this condition. This procedure allows your caregiver to look into the larynx. HOME CARE INSTRUCTIONS  Drink enough fluids to keep your  urine clear or pale yellow.  Rest until you no longer have symptoms or as directed by your caregiver.  Breathe in moist air.  Take all medicine as directed by your caregiver.  Do not smoke.  Talk as little as possible (this includes whispering).  Write on paper instead of talking until your voice is back to normal.  Follow up with your caregiver if your condition has not improved after 10 days. SEEK MEDICAL CARE IF:   You have trouble breathing.  You cough up blood.  You have persistent fever.  You have increasing pain.  You have difficulty swallowing. MAKE SURE YOU:  Understand these instructions.  Will watch your condition.  Will get help right away if you are not doing well or get worse. Document Released: 06/14/2005 Document Revised: 09/06/2011 Document Reviewed: 08/20/2010 [facility] Patient Information 2015 [place], [place]. This information is not intended to replace advice given to you by your health care provider. Make sure you discuss any questions you have with your health care provider.

## 2014-07-07 NOTE — ED Notes (Signed)
States for one week had had sore throat, hoarseness and periods of nose bleeds.

## 2014-10-28 ENCOUNTER — Emergency Department
Admission: EM | Admit: 2014-10-28 | Discharge: 2014-10-28 | Disposition: A | Discharge status: Home / Self Care | Payer: BLUE CROSS/BLUE SHIELD | Source: Home / Self Care | Attending: Family Medicine | Admitting: Family Medicine

## 2014-10-28 ENCOUNTER — Emergency Department (INDEPENDENT_AMBULATORY_CARE_PROVIDER_SITE_OTHER): Payer: BLUE CROSS/BLUE SHIELD

## 2014-10-28 ENCOUNTER — Encounter: Payer: Self-pay | Admitting: Emergency Medicine

## 2014-10-28 DIAGNOSIS — H9202 Otalgia, left ear: Secondary | ICD-10-CM | POA: Diagnosis not present

## 2014-10-28 DIAGNOSIS — H9212 Otorrhea, left ear: Secondary | ICD-10-CM | POA: Diagnosis not present

## 2014-10-28 LAB — POCT CBC W AUTO DIFF (K'VILLE URGENT CARE)

## 2014-10-28 MED ORDER — AMOXICILLIN 875 MG PO TABS: 875.0000 mg | ORAL_TABLET | Freq: Two times a day (BID) | ORAL | Status: DC

## 2014-10-28 NOTE — ED Provider Notes (Signed)
CSN: [id number]     Arrival date & time 10/28/14  1456 History   First MD Initiated Contact with Patient 10/28/14 1514     Chief Complaint  Patient presents with  . Otalgia     HPI Comments: Patient reports that about one month ago she developed pain behind her left ear that has persisted.  However, there was no swelling behind her left ear.  Two days ago she awoke with a significant amount of purulent drainage from her left ear.  The left ear has continued to drain less each day.  Yesterday she had tenderness to palpation behind her left ear without swelling.  No fevers, chills, and sweats.  She feels well otherwise.  Patient is a 24 y.o. female presenting with ear drainage. The history is provided by the patient.  Ear Drainage This is a new problem. The current episode started 2 days ago. The problem occurs daily. The problem has been gradually improving. Pertinent negatives include no headaches. Associated symptoms comments: No fever . Nothing aggravates the symptoms. Nothing relieves the symptoms. She has tried nothing for the symptoms.    Past Medical History  Diagnosis Date  . Swine flu 2009  . Asthma    Past Surgical History  Procedure Laterality Date  . Appendectomy  1996   Family History  Problem Relation Age of Onset  . Arrhythmia Brother     half-brother  . Diabetes Maternal Aunt   . Cancer Maternal Grandmother     SKIN  . Cancer Maternal Grandfather     SKIN  . Hypertension Paternal Grandmother   . Hypertension Paternal Grandfather   . Cancer Paternal Grandfather     MELANOMA  . Colon polyps Father    History  Substance Use Topics  . Smoking status: Never Smoker   . Smokeless tobacco: Never Used  . Alcohol Use: Yes     Comment: rare   OB History    Gravida Para Term Preterm AB TAB SAB Ectopic Multiple Living   0              Review of Systems  Neurological: Negative for headaches.  All other systems reviewed and are negative.   Allergies    Azithromycin and Cayenne pepper  Home Medications   Prior to Admission medications   Medication Sig Start Date End Date Taking? Authorizing Provider  albuterol (PROVENTIL HFA;VENTOLIN HFA) 108 (90 BASE) MCG/ACT inhaler Inhale 1-2 puffs into the lungs every 6 (six) hours as needed for wheezing or shortness of breath. 07/07/14   Rodolph Bong, MD  amoxicillin (AMOXIL) 875 MG tablet Take 1 tablet (875 mg total) by mouth 2 (two) times daily. 10/28/14   Lattie Haw, MD  etonogestrel (NEXPLANON) 68 MG IMPL implant Inject 1 each into the skin once.    Historical Provider, MD  ipratropium (ATROVENT) 0.06 % nasal spray Place 2 sprays into both nostrils 4 (four) times daily. 07/07/14   Rodolph Bong, MD  predniSONE (DELTASONE) 10 MG tablet Take 3 tablets (30 mg total) by mouth daily. 07/07/14   Rodolph Bong, MD  promethazine-codeine (PHENERGAN WITH CODEINE) 6.25-10 MG/5ML syrup Take 5 mLs by mouth at bedtime as needed for cough. 07/07/14   Rodolph Bong, MD   BP 108/73 mmHg  Pulse 62  Temp(Src) 98 F (36.7 C) (Oral)  Ht  (1.549 m)  Wt 134 lb (60.782 kg)  BMI 25.33 kg/m2  SpO2 97%  LMP 10/09/2014 Physical Exam  Constitutional: She  is oriented to person, place, and time. She appears well-developed and well-nourished. No distress.  HENT:  Head: Normocephalic and atraumatic.    Right Ear: Tympanic membrane, external ear and ear canal normal.  Left Ear: Tympanic membrane, external ear and ear canal normal.  Nose: Nose normal.  Mouth/Throat: Oropharynx is clear and moist.  There is distinct tenderness to palpation behind patient's left ear and over left mastoid.  However there is no swelling or erythema noted.  Tenderness but minimal enlargement of left post-auricular node.  Eyes: Conjunctivae are normal. Pupils are equal, round, and reactive to light. Right eye exhibits no discharge. Left eye exhibits no discharge.  Neck: Neck supple.  Cardiovascular: Normal heart sounds.   Pulmonary/Chest:  Breath sounds normal.  Lymphadenopathy:    She has no cervical adenopathy.  Neurological: She is alert and oriented to person, place, and time.  Skin: Skin is warm and dry.  Nursing note and vitals reviewed.   ED Course  Procedures  None  Labs Reviewed -   POCT CBC:  WBC 9.7; LY 36.0; MO 6.2; GR 57.8; Hgb 14.1; Platelets 330   Imaging Review Ct Temporal Bones W/o Cm  10/28/2014   CLINICAL DATA:  Left mastoid and ear pain for 1 week with drainage. Congestion for 1 month. Question mastoiditis. Otalgia  EXAM: CT TEMPORAL BONES WITHOUT CONTRAST  TECHNIQUE: Axial and coronal plane CT imaging of the petrous temporal bones was performed with thin-collimation image reconstruction. No intravenous contrast was administered. Multiplanar CT image reconstructions were also generated.  COMPARISON:  None.  FINDINGS: Limited imaging of the brain is unremarkable. The visualized globes and orbits are intact. The paranasal sinuses are clear.  The right external auditory canal is clear. The tympanic membrane is intact. The middle ear ossicles are normally formed and articulating. The oval window is patent. The middle ear cavity and mastoid air cells are clear. The inner ear structures are normally formed. The right semicircular canal is covered. The right internal auditory canal and vestibular aqueduct are within normal limits.  The left external auditory canal is clear. The tympanic membrane is visualized and intact. The left middle ear ossicles are normally formed and articulating. The oval window is patent. The left middle ear cavity and mastoid air cells are clear. There is no evidence scratch the there is no mastoid effusion or mastoiditis. The adjacent subcutaneous tissues in scan are within normal limits. The inner ear structures are normally formed. The a left superior semicircular canal is covered. The left internal auditory canal and vestibular aqueduct are normal.  IMPRESSION: 1. Normal CT appearance of the  temporal bones bilaterally. 2. No evidence for mastoid effusion or mastoiditis.   Electronically Signed   By: Marin Robertshristopher  Mattern M.D.   On: 10/28/2014 16:50     MDM   1. Otalgia of left ear    Although CT negative, patient's history and exam suggestive of recent left mastoiditis.  Normal white blood count and CT reassuring. Empirically begin amoxicillin 875mg  BID   May add Pseudoephedrine (30mg , one or two every 4 to 6 hours) for sinus congestion.  May use Afrin nasal spray (or generic oxymetazoline) twice daily for about 5 days.  Also recommend using saline nasal spray several times daily and saline nasal irrigation (AYR is a common brand).  Followup with Family Doctor if not improved in about 10 days.    Lattie HawStephen A Beese, MD 10/30/14 [phone]

## 2014-10-28 NOTE — ED Notes (Signed)
2 days ago woke up with left ear draining yellow, bloody liquid onto pillow. It has continued to drain but less each day, was swollen yesterday behind her ear and very painful to the touch.

## 2014-10-28 NOTE — Discharge Instructions (Signed)
May add Pseudoephedrine (30mg , one or two every 4 to 6 hours) for sinus congestion.  May use Afrin nasal spray (or generic oxymetazoline) twice daily for about 5 days.  Also recommend using saline nasal spray several times daily and saline nasal irrigation (AYR is a common brand).

## 2014-11-11 DIAGNOSIS — S52502A Unspecified fracture of the lower end of left radius, initial encounter for closed fracture: Secondary | ICD-10-CM | POA: Insufficient documentation

## 2014-11-15 DIAGNOSIS — S62319A Displaced fracture of base of unspecified metacarpal bone, initial encounter for closed fracture: Secondary | ICD-10-CM | POA: Insufficient documentation

## 2015-05-13 ENCOUNTER — Other Ambulatory Visit (HOSPITAL_COMMUNITY)
Admission: RE | Admit: 2015-05-13 | Discharge: 2015-05-13 | Disposition: A | Discharge status: Home / Self Care | Payer: BLUE CROSS/BLUE SHIELD | Source: Ambulatory Visit | Attending: Obstetrics and Gynecology | Admitting: Obstetrics and Gynecology

## 2015-05-13 ENCOUNTER — Other Ambulatory Visit: Payer: Self-pay | Admitting: Nurse Practitioner

## 2015-05-13 DIAGNOSIS — Z01419 Encounter for gynecological examination (general) (routine) without abnormal findings: Secondary | ICD-10-CM | POA: Insufficient documentation

## 2015-05-14 LAB — CYTOLOGY - PAP

## 2015-05-25 ENCOUNTER — Emergency Department
Admission: EM | Admit: 2015-05-25 | Discharge: 2015-05-25 | Disposition: A | Discharge status: Home / Self Care | Payer: BLUE CROSS/BLUE SHIELD | Source: Home / Self Care | Attending: Family Medicine | Admitting: Family Medicine

## 2015-05-25 ENCOUNTER — Encounter: Payer: Self-pay | Admitting: Emergency Medicine

## 2015-05-25 DIAGNOSIS — J209 Acute bronchitis, unspecified: Secondary | ICD-10-CM

## 2015-05-25 DIAGNOSIS — R110 Nausea: Secondary | ICD-10-CM | POA: Diagnosis not present

## 2015-05-25 LAB — POCT RAPID STREP A (OFFICE): Rapid Strep A Screen: NEGATIVE

## 2015-05-25 MED ORDER — BENZONATATE 200 MG PO CAPS: 200.0000 mg | ORAL_CAPSULE | Freq: Every day | ORAL | Status: DC

## 2015-05-25 MED ORDER — DOXYCYCLINE HYCLATE 100 MG PO CAPS: 100.0000 mg | ORAL_CAPSULE | Freq: Two times a day (BID) | ORAL | Status: DC

## 2015-05-25 MED ORDER — ONDANSETRON 4 MG PO TBDP: ORAL_TABLET | ORAL | Status: DC

## 2015-05-25 NOTE — ED Notes (Signed)
Gives 2 days history of progressive worsening congestion, sore throat, chills, sense of fever, aches and nausea. Did have flu vaccination this season. No OTC in 12 hours.

## 2015-05-25 NOTE — ED Provider Notes (Signed)
CSN: [id number]     Arrival date & time 05/25/15  1135 History   First MD Initiated Contact with Patient 05/25/15 1215     Chief Complaint  Patient presents with  . Chills  . Fever  . Generalized Body Aches  . Sore Throat  . Nasal Congestion  . Nausea      HPI Comments: Patient developed sore throat and headache two days ago.  Yesterday she became acutely worse with chills, myalgias, sinus congestion, non-productive cough, nausea/vomiting, and watery diarrhea.  Her vomiting has been controlled with Phenergan.  She has a history of mild asthma but has not needed to use her albuterol inhaler.  She has a history of seasonal rhinitis.  She has had flu immunization this season.  The history is provided by the patient.    Past Medical History  Diagnosis Date  . Swine flu 2009  . Asthma    Past Surgical History  Procedure Laterality Date  . Appendectomy  1996   Family History  Problem Relation Age of Onset  . Arrhythmia Brother     half-brother  . Diabetes Maternal Aunt   . Cancer Maternal Grandmother     SKIN  . Cancer Maternal Grandfather     SKIN  . Hypertension Paternal Grandmother   . Hypertension Paternal Grandfather   . Cancer Paternal Grandfather     MELANOMA  . Colon polyps Father    Social History  Substance Use Topics  . Smoking status: Never Smoker   . Smokeless tobacco: Never Used  . Alcohol Use: Yes     Comment: rare   OB History    Gravida Para Term Preterm AB TAB SAB Ectopic Multiple Living   0              Review of Systems + sore throat + hoarse + cough + sneezing No pleuritic pain, but feels tight in anterior chest No wheezing + nasal congestion + post-nasal drainage No sinus pain/pressure No itchy/red eyes No earache + dizzy No hemoptysis No SOB + fever, + chills + nausea + vomiting No abdominal pain + diarrhea No urinary symptoms No skin rash + fatigue + myalgias + headache Used OTC meds without relief  Allergies   Azithromycin and Cayenne pepper  Home Medications   Prior to Admission medications   Medication Sig Start Date End Date Taking? Authorizing Provider  albuterol (PROVENTIL HFA;VENTOLIN HFA) 108 (90 BASE) MCG/ACT inhaler Inhale 1-2 puffs into the lungs every 6 (six) hours as needed for wheezing or shortness of breath. 07/07/14   Rodolph Bong, MD  benzonatate (TESSALON) 200 MG capsule Take 1 capsule (200 mg total) by mouth at bedtime. Take as needed for cough 05/25/15   Lattie Haw, MD  doxycycline (VIBRAMYCIN) 100 MG capsule Take 1 capsule (100 mg total) by mouth 2 (two) times daily. Take with food. 05/25/15   Lattie Haw, MD  etonogestrel (NEXPLANON) 68 MG IMPL implant Inject 1 each into the skin once.    Historical Provider, MD  ipratropium (ATROVENT) 0.06 % nasal spray Place 2 sprays into both nostrils 4 (four) times daily. 07/07/14   Rodolph Bong, MD  ondansetron (ZOFRAN ODT) 4 MG disintegrating tablet Take one tab by mouth Q6hr prn nausea 05/25/15   Lattie Haw, MD   Meds Ordered and Administered this Visit  Medications - No data to display  BP 92/62 mmHg  Pulse 78  Temp(Src) 98.3 F (36.8 C) (Oral)  Resp 16  Ht 5\' 1"  (1.549 m)  Wt 136 lb (61.689 kg)  BMI 25.71 kg/m2  SpO2 97% No data found.   Physical Exam Nursing notes and Vital Signs reviewed. Appearance:  Patient appears stated age, and in no acute distress Eyes:  Pupils are equal, round, and reactive to light and accomodation.  Extraocular movement is intact.  Conjunctivae are not inflamed  Ears:  Canals normal.  Tympanic membranes normal.  Nose:  Congested turbinates.  No sinus tenderness.    Pharynx:  Minimal erythema Neck:  Supple.   Tender enlarged posterior nodes are palpated bilaterally  Lungs:  Clear to auscultation.  Breath sounds are equal.  Moving air well. Heart:  Regular rate and rhythm without murmurs, rubs, or gallops.  Abdomen:  Nontender without masses or hepatosplenomegaly.  Bowel sounds are  present.  No CVA or flank tenderness.  Extremities:  No edema.    Skin:  No rash present.   ED Course  Procedures  None    Labs Reviewed -  POCT rapid strep test negative    MDM   1. Acute bronchitis, unspecified organism; with GI symptoms present consider atypical agent such as mycoplasma   2. Nausea without vomiting    Begin doxycycline 100mg  BID.  Prescription written for Benzonatate [facility](Tessalon) to take at bedtime for night-time cough.  Rx for Zofran ODT 4mg  Take plain guaifenesin (1200mg  extended release tabs such as Mucinex) twice daily, with plenty of water, for cough and congestion.  May add Pseudoephedrine (30mg , one or two every 4 to 6 hours) for sinus congestion.  Get adequate rest.   May use Afrin nasal spray (or generic oxymetazoline) twice daily for about 5 days and then discontinue.  Also recommend using saline nasal spray several times daily and saline nasal irrigation (AYR is a common brand).  Try warm salt water gargles for sore throat.  Stop all antihistamines for now, and other non-prescription cough/cold preparations. May use albuterol inhaler as needed. May take Tylenol or Ibuprofen as needed for muscle aches, fever, etc. Follow-up with family doctor if not improving about 7 to10 days.      Lattie HawStephen A Beese, MD 05/25/15 [phone]

## 2015-05-25 NOTE — Discharge Instructions (Signed)
Take plain guaifenesin (1200mg  extended release tabs such as Mucinex) twice daily, with plenty of water, for cough and congestion.  May add Pseudoephedrine (30mg , one or two every 4 to 6 hours) for sinus congestion.  Get adequate rest.   May use Afrin nasal spray (or generic oxymetazoline) twice daily for about 5 days and then discontinue.  Also recommend using saline nasal spray several times daily and saline nasal irrigation (AYR is a common brand).  Try warm salt water gargles for sore throat.  Stop all antihistamines for now, and other non-prescription cough/cold preparations. May use albuterol inhaler as needed. May take Tylenol or Ibuprofen as needed for muscle aches, fever, etc. Follow-up with family doctor if not improving about 7 to10 days.

## 2015-06-01 ENCOUNTER — Telehealth: Payer: Self-pay

## 2015-06-05 ENCOUNTER — Other Ambulatory Visit: Payer: Self-pay | Admitting: Family Medicine

## 2015-06-05 DIAGNOSIS — R1319 Other dysphagia: Secondary | ICD-10-CM

## 2015-06-09 ENCOUNTER — Other Ambulatory Visit: Payer: BLUE CROSS/BLUE SHIELD

## 2015-06-16 ENCOUNTER — Ambulatory Visit
Admission: RE | Admit: 2015-06-16 | Discharge: 2015-06-16 | Disposition: A | Discharge status: Home / Self Care | Payer: BLUE CROSS/BLUE SHIELD | Source: Ambulatory Visit | Attending: Family Medicine | Admitting: Family Medicine

## 2015-06-16 DIAGNOSIS — R1319 Other dysphagia: Secondary | ICD-10-CM

## 2015-06-17 ENCOUNTER — Other Ambulatory Visit: Payer: Self-pay | Admitting: Lactation Services

## 2015-06-17 ENCOUNTER — Other Ambulatory Visit: Payer: Self-pay | Admitting: Family Medicine

## 2015-06-17 DIAGNOSIS — R1310 Dysphagia, unspecified: Secondary | ICD-10-CM

## 2015-09-26 ENCOUNTER — Emergency Department (INDEPENDENT_AMBULATORY_CARE_PROVIDER_SITE_OTHER)
Admission: EM | Admit: 2015-09-26 | Discharge: 2015-09-26 | Disposition: A | Discharge status: Home / Self Care | Payer: BLUE CROSS/BLUE SHIELD | Source: Home / Self Care | Attending: Family Medicine | Admitting: Family Medicine

## 2015-09-26 ENCOUNTER — Encounter: Payer: Self-pay | Admitting: *Deleted

## 2015-09-26 DIAGNOSIS — R197 Diarrhea, unspecified: Secondary | ICD-10-CM | POA: Diagnosis not present

## 2015-09-26 DIAGNOSIS — R110 Nausea: Secondary | ICD-10-CM

## 2015-09-26 LAB — POCT URINALYSIS DIP (MANUAL ENTRY)
Bilirubin, UA: NEGATIVE
Blood, UA: NEGATIVE
Glucose, UA: NEGATIVE
Leukocytes, UA: NEGATIVE
Nitrite, UA: NEGATIVE
Spec Grav, UA: 1.02 (ref 1.005–1.03)
Urobilinogen, UA: 0.2 (ref 0–1)
pH, UA: 6 (ref 5–8)

## 2015-09-26 LAB — POCT CBC W AUTO DIFF (K'VILLE URGENT CARE)

## 2015-09-26 MED ORDER — ONDANSETRON 4 MG PO TBDP
4.0000 mg | ORAL_TABLET | Freq: Once | ORAL | Status: AC
  Administered 2015-09-26: 4 mg via ORAL

## 2015-09-26 MED ORDER — ONDANSETRON 4 MG PO TBDP
ORAL_TABLET | ORAL | Status: DC
Start: 2015-09-26 — End: 2016-09-28

## 2015-09-26 NOTE — ED Provider Notes (Signed)
CSN: [id number]     Arrival date & time 09/26/15  1359 History   First MD Initiated Contact with Patient 09/26/15 1518     Chief Complaint  Patient presents with  . Abdominal Pain      HPI Comments: About 1.5 hours ago patient developed sweats, abdominal pain/cramps, nausea, watery diarrhea, and chills/sweats.  She has had no vomiting.  She took Zofran  ODT, one hour ago.  Denies recent foreign travel, or drinking untreated water in a wilderness environment.  She denies recent antibiotic use.  Past history of appendectomy.   Patient is a 25 y.o. female presenting with diarrhea. The history is provided by the patient.  Diarrhea Quality:  Watery Severity:  Mild Onset quality:  Sudden Duration:  4 hours Timing:  Intermittent Progression:  Unchanged Relieved by:  Nothing Worsened by:  Nothing tried Ineffective treatments:  None tried Associated symptoms: abdominal pain, chills and diaphoresis   Associated symptoms: no arthralgias, no recent cough, no fever, no headaches, no myalgias, no URI and no vomiting   Risk factors: no recent antibiotic use, no sick contacts, no suspicious food intake and no travel to endemic areas     Past Medical History  Diagnosis Date  . Swine flu 2009  . Asthma    Past Surgical History  Procedure Laterality Date  . Appendectomy  1996   Family History  Problem Relation Age of Onset  . Arrhythmia Brother     half-brother  . Diabetes Maternal Aunt   . Cancer Maternal Grandmother     SKIN  . Cancer Maternal Grandfather     SKIN  . Hypertension Paternal Grandmother   . Hypertension Paternal Grandfather   . Cancer Paternal Grandfather     MELANOMA  . Colon polyps Father    Social History  Substance Use Topics  . Smoking status: Never Smoker   . Smokeless tobacco: Never Used  . Alcohol Use: Yes     Comment: rare   OB History    Gravida Para Term Preterm AB TAB SAB Ectopic Multiple Living   0              Review of Systems   Constitutional: Positive for chills and diaphoresis. Negative for fever.  Gastrointestinal: Positive for abdominal pain and diarrhea. Negative for vomiting.  Musculoskeletal: Negative for myalgias and arthralgias.  Neurological: Negative for headaches.  All other systems reviewed and are negative.   Allergies  Azithromycin and Cayenne pepper  Home Medications   Prior to Admission medications   Medication Sig Start Date End Date Taking? Authorizing Provider  etonogestrel (NEXPLANON) 68 MG IMPL implant Inject 1 each into the skin once.    Historical Provider, MD  ipratropium (ATROVENT) 0.06 % nasal spray Place 2 sprays into both nostrils 4 (four) times daily. 07/07/14   Rodolph Bong, MD  ondansetron (ZOFRAN ODT) 4 MG disintegrating tablet Take one tab by mouth Q6hr prn nausea 09/26/15   Lattie Haw, MD   Meds Ordered and Administered this Visit   Medications  ondansetron (ZOFRAN-ODT) disintegrating tablet 4 mg (4 mg Oral Given 09/26/15 1500)    BP 122/68 mmHg  Pulse 83  Temp(Src) 97.5 F (36.4 C) (Oral)  Resp 18  Wt 130 lb (58.968 kg)  SpO2 97% No data found.   Physical Exam Nursing notes and Vital Signs reviewed. Appearance:  Patient appears stated age, and in no acute distress Eyes:  Pupils are equal, round, and reactive to light and accomodation.  Extraocular movement is intact.  Conjunctivae are not inflamed  Nose:  Normal Mouth/Pharynx:  Normal; moist mucous membranes  Neck:  Supple.  No adenopathy Lungs:  Clear to auscultation.  Breath sounds are equal.  Moving air well. Heart:  Regular rate and rhythm without murmurs, rubs, or gallops.  Abdomen:  Nontender without masses or hepatosplenomegaly.  Bowel sounds are present.  No CVA or flank tenderness.  Extremities:  No edema.  Skin:  No rash present.   ED Course  Procedures none    Labs Reviewed  POCT URINALYSIS DIP (MANUAL ENTRY) - Abnormal; Notable for the following:    Ketones, POC UA trace (5) (*)     Protein Ur, POC trace (*)      SG = 1.020    All other components within normal limits  POCT CBC W AUTO DIFF ([facility]):  WBC 10.5; LY 31.2; MO 6.1; GR 62.7; Hgb 13.2; Platelets 308       MDM   1. Nausea without vomiting; suspect viral gastroenteritis  2. Diarrhea, unspecified type    Administered Zofran ODT 4mg  po; given Rx for same Begin clear liquids (Pedialyte while having diarrhea) until improved, then advance to a SUPER[employer]BRAT diet (Bananas, Rice, Applesauce, Toast).  Then gradually resume a regular diet when tolerated.  Avoid milk products until well.  When stools become more formed, may take Imodium (loperamide) once or twice daily to decrease stool frequency.  If symptoms become significantly worse during the night or over the weekend, proceed to the local emergency room.  Followup with Family Doctor if not improved in three days.    Lattie HawStephen A Beese, MD 09/30/15 [phone]

## 2015-09-26 NOTE — ED Notes (Signed)
Pt c/o lower abdominal pain started suddenly about 1 1/2 hours ago. Accompanied by diarrhea, sweats and nausea. Pt reports she "doesnt throw up" so she has not had any vomiting. H/o appendectomy. She had a plate removed from her LFA yesterday. Taken 1 4mg  PO Zofran 1 hours ago.

## 2015-09-26 NOTE — Discharge Instructions (Signed)
Begin clear liquids (Pedialyte while having diarrhea) until improved, then advance to a BRAT diet (Bananas, Rice, Applesauce, Toast).  Then gradually resume a regular diet when tolerated.  Avoid milk products until well.  When stools become more formed, may take Imodium (loperamide) once or twice daily to decrease stool frequency.  °If symptoms become significantly worse during the night or over the weekend, proceed to the local emergency room.  °

## 2015-09-28 ENCOUNTER — Telehealth: Payer: Self-pay

## 2015-10-09 DIAGNOSIS — Z9889 Other specified postprocedural states: Secondary | ICD-10-CM | POA: Insufficient documentation

## 2015-10-22 DIAGNOSIS — L700 Acne vulgaris: Secondary | ICD-10-CM | POA: Diagnosis not present

## 2015-10-22 DIAGNOSIS — Z79899 Other long term (current) drug therapy: Secondary | ICD-10-CM | POA: Diagnosis not present

## 2015-10-23 DIAGNOSIS — Z79899 Other long term (current) drug therapy: Secondary | ICD-10-CM | POA: Diagnosis not present

## 2015-11-26 DIAGNOSIS — L700 Acne vulgaris: Secondary | ICD-10-CM | POA: Diagnosis not present

## 2015-11-26 DIAGNOSIS — Z79899 Other long term (current) drug therapy: Secondary | ICD-10-CM | POA: Diagnosis not present

## 2015-12-29 DIAGNOSIS — L700 Acne vulgaris: Secondary | ICD-10-CM | POA: Diagnosis not present

## 2015-12-29 DIAGNOSIS — Z79899 Other long term (current) drug therapy: Secondary | ICD-10-CM | POA: Diagnosis not present

## 2016-02-02 DIAGNOSIS — Z79899 Other long term (current) drug therapy: Secondary | ICD-10-CM | POA: Diagnosis not present

## 2016-02-02 DIAGNOSIS — L700 Acne vulgaris: Secondary | ICD-10-CM | POA: Diagnosis not present

## 2016-03-17 DIAGNOSIS — Z23 Encounter for immunization: Secondary | ICD-10-CM | POA: Diagnosis not present

## 2016-03-17 DIAGNOSIS — Z0000 Encounter for general adult medical examination without abnormal findings: Secondary | ICD-10-CM | POA: Diagnosis not present

## 2016-03-30 DIAGNOSIS — Z01419 Encounter for gynecological examination (general) (routine) without abnormal findings: Secondary | ICD-10-CM | POA: Diagnosis not present

## 2016-06-02 DIAGNOSIS — L700 Acne vulgaris: Secondary | ICD-10-CM | POA: Diagnosis not present

## 2016-06-02 IMAGING — CT CT TEMPORAL BONES W/O CM
1 of 12 series · 3 of 30 positions shown, 4 images · non-contrast
Comparison: None.

CLINICAL DATA: Left mastoid and ear pain for 1 week with drainage.
Congestion for 1 month. Question mastoiditis. Otalgia

EXAM:
CT TEMPORAL BONES WITHOUT CONTRAST
TECHNIQUE: Axial and coronal plane CT imaging of the petrous temporal bones was
performed with thin-collimation image reconstruction. No intravenous
contrast was administered. Multiplanar CT image reconstructions were
also generated.

[Series 3: cor mag rt · axial · 0.24mm/px · z∈[-158,-69]mm · 3 of 144 slices shown, 4 images]
[im 1/144  brain]
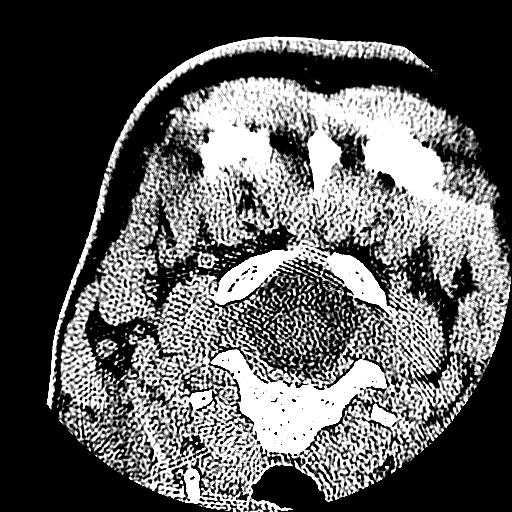
[im 1/144  bone]
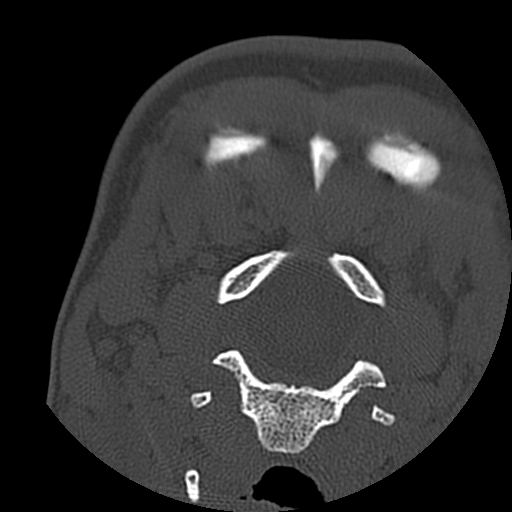
[im 72/144  bone]
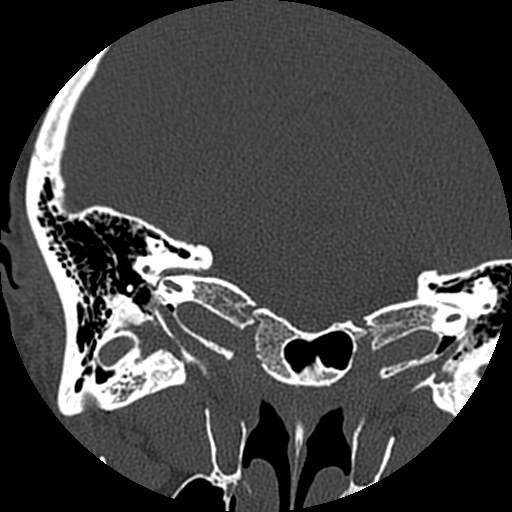
[im 144/144  bone]
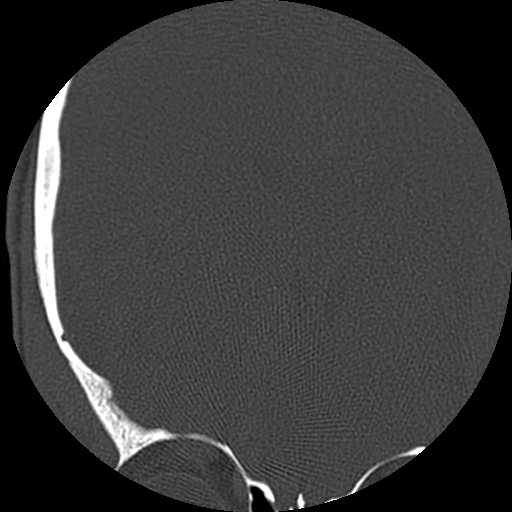

[3 of 30 positions shown; findings below may reference images not displayed]

FINDINGS: Limited imaging of the brain is unremarkable. The visualized globes
and orbits are intact. The paranasal sinuses are clear.

The right external auditory canal is clear. The tympanic membrane is
intact. The middle ear ossicles are normally formed and
articulating. The oval window is patent. The middle ear cavity and
mastoid air cells are clear. The inner ear structures are normally
formed. The right semicircular canal is covered. The right internal
auditory canal and vestibular aqueduct are within normal limits.

The left external auditory canal is clear. The tympanic membrane is
visualized and intact. The left middle ear ossicles are normally
formed and articulating. The oval window is patent. The left middle
ear cavity and mastoid air cells are clear. There is no evidence
scratch the there is no mastoid effusion or mastoiditis. The
adjacent subcutaneous tissues in scan are within normal limits. The
inner ear structures are normally formed. The a left superior
semicircular canal is covered. The left internal auditory canal and
vestibular aqueduct are normal.
IMPRESSION: 1. Normal CT appearance of the temporal bones bilaterally.
2. No evidence for mastoid effusion or mastoiditis.

## 2016-09-28 ENCOUNTER — Emergency Department (INDEPENDENT_AMBULATORY_CARE_PROVIDER_SITE_OTHER): Payer: BLUE CROSS/BLUE SHIELD

## 2016-09-28 ENCOUNTER — Encounter: Payer: Self-pay | Admitting: *Deleted

## 2016-09-28 ENCOUNTER — Emergency Department
Admission: EM | Admit: 2016-09-28 | Discharge: 2016-09-28 | Disposition: A | Discharge status: Home / Self Care | Payer: BLUE CROSS/BLUE SHIELD | Source: Home / Self Care | Attending: Family Medicine | Admitting: Family Medicine

## 2016-09-28 DIAGNOSIS — R059 Cough, unspecified: Secondary | ICD-10-CM

## 2016-09-28 DIAGNOSIS — R05 Cough: Secondary | ICD-10-CM

## 2016-09-28 DIAGNOSIS — R0989 Other specified symptoms and signs involving the circulatory and respiratory systems: Secondary | ICD-10-CM

## 2016-09-28 HISTORY — DX: Other seasonal allergic rhinitis: J30.2

## 2016-09-28 MED ORDER — BENZONATATE 100 MG PO CAPS: 100.0000 mg | ORAL_CAPSULE | Freq: Three times a day (TID) | ORAL | 0 refills | Status: DC

## 2016-09-28 NOTE — ED Provider Notes (Signed)
CSN: [id number]     Arrival date & time 09/28/16  1401 History   First MD Initiated Contact with Patient 09/28/16 1439     Chief Complaint  Patient presents with  . Cough   (Consider location/radiation/quality/duration/timing/severity/associated sxs/prior Treatment) HPI  Clariece Roesler is a 26 y.o. female presenting to UC with c/o 2 days of cough with congestion, mild sore throat and mild bilateral ear pain. Symptoms started after pouring concrete at her house as well as after sanding some wood furniture. She notes she was wearing a cheap mask but is concerned the dust got into her lungs. Hx of asthma. She has coughed up some black appearing sputum.  Denies chest pain or SOB. No fever, chills, n/v/d. No known sick contacts.    Past Medical History:  Diagnosis Date  . Asthma   . Seasonal allergies   . Swine flu 2009   Past Surgical History:  Procedure Laterality Date  . APPENDECTOMY  1996  . ORIF DISTAL RADIUS FRACTURE    . WISDOM TOOTH EXTRACTION     Family History  Problem Relation Age of Onset  . Colon polyps Father   . Diabetes Father   . Diverticulitis Father   . Cancer Father   . Arrhythmia Brother     half-brother  . Diabetes Maternal Aunt   . Cancer Maternal Grandmother     SKIN  . Cancer Maternal Grandfather     SKIN  . Hypertension Paternal Grandmother   . Hypertension Paternal Grandfather   . Cancer Paternal Grandfather     MELANOMA   Social History  Substance Use Topics  . Smoking status: Former Games developer  . Smokeless tobacco: Never Used  . Alcohol use Yes     Comment: rare   OB History    Gravida Para Term Preterm AB Living   0             SAB TAB Ectopic Multiple Live Births                 Review of Systems  Constitutional: Negative for chills and fever.  HENT: Positive for congestion, ear pain and sore throat. Negative for trouble swallowing and voice change.   Respiratory: Positive for cough. Negative for shortness of breath.   Cardiovascular:  Negative for chest pain and palpitations.  Gastrointestinal: Negative for abdominal pain, diarrhea, nausea and vomiting.  Musculoskeletal: Negative for arthralgias, back pain and myalgias.  Skin: Negative for rash.    Allergies  Azithromycin; Cayenne pepper [cayenne]; and Doxycycline  Home Medications   Prior to Admission medications   Medication Sig Start Date End Date Taking? Authorizing Provider  albuterol (PROVENTIL HFA;VENTOLIN HFA) 108 (90 Base) MCG/ACT inhaler Inhale into the lungs every 6 (six) hours as needed for wheezing or shortness of breath.   Yes Historical Provider, MD  drospirenone-ethinyl estradiol (YAZ,GIANVI,LORYNA) 3-0.02 MG tablet Take 1 tablet by mouth daily.   Yes Historical Provider, MD  pantoprazole (PROTONIX) 40 MG tablet Take 40 mg by mouth daily.   Yes Historical Provider, MD  benzonatate (TESSALON) 100 MG capsule Take 1-2 capsules (100-200 mg total) by mouth every 8 (eight) hours. 09/28/16   Junius Finner, PA-C   Meds Ordered and Administered this Visit  Medications - No data to display  BP 118/74 (BP Location: Left Arm)   Pulse 69   Temp 98.3 F (36.8 C) (Oral)   Resp 16   Ht  (1.549 m)   Wt 122 lb (55.3 kg)  LMP 08/30/2016   SpO2 97%   BMI 23.05 kg/m  No data found.   Physical Exam  Constitutional: She is oriented to person, place, and time. She appears well-developed and well-nourished. No distress.  HENT:  Head: Normocephalic and atraumatic.  Right Ear: Tympanic membrane normal.  Left Ear: Tympanic membrane normal.  Nose: Nose normal.  Mouth/Throat: Uvula is midline, oropharynx is clear and moist and mucous membranes are normal.  Eyes: EOM are normal.  Neck: Normal range of motion. Neck supple.  Cardiovascular: Normal rate and regular rhythm.   Pulmonary/Chest: Effort normal and breath sounds normal. No stridor. No respiratory distress. She has no wheezes. She has no rales.  Musculoskeletal: Normal range of motion.  Lymphadenopathy:     She has no cervical adenopathy.  Neurological: She is alert and oriented to person, place, and time.  Skin: Skin is warm and dry. She is not diaphoretic.  Psychiatric: She has a normal mood and affect. Her behavior is normal.  Nursing note and vitals reviewed.   Urgent Care Course     Procedures (including critical care time)  Labs Review Labs Reviewed - No data to display  Imaging Review Dg Chest 2 View  Result Date: 09/28/2016 CLINICAL DATA:  Cough and congestion for 2 days. EXAM: CHEST  2 VIEW COMPARISON:  PA and lateral chest 03/27/2011. FINDINGS: The lungs are clear. Heart size is normal. No pneumothorax or pleural effusion. No acute bony abnormality. IMPRESSION: Negative chest. Electronically Signed   By: Drusilla Kanner M.D.   On: 09/28/2016 14:59     MDM   1. Cough   2. Chest congestion    O2 Sat 97% on RA. No respiratory distress. CXR: normal  Reassured pt. Symptoms likely viral Encouraged fluids and rest. Rx: Tessalon f/u with PCP in 1 week if not improving.     Junius Finner, PA-C 09/28/16 [phone]

## 2016-09-28 NOTE — ED Triage Notes (Signed)
Pt c/o cough x 2 days after pouring concrete at her house. She was wearing a cheap mask, but is concerned that she may have aspirated some of the powder. Denies fever.

## 2016-10-01 ENCOUNTER — Emergency Department
Admission: EM | Admit: 2016-10-01 | Discharge: 2016-10-01 | Disposition: A | Discharge status: Home / Self Care | Payer: BLUE CROSS/BLUE SHIELD | Source: Home / Self Care | Attending: Family Medicine | Admitting: Family Medicine

## 2016-10-01 DIAGNOSIS — J069 Acute upper respiratory infection, unspecified: Secondary | ICD-10-CM

## 2016-10-01 DIAGNOSIS — R519 Headache, unspecified: Secondary | ICD-10-CM

## 2016-10-01 DIAGNOSIS — B9689 Other specified bacterial agents as the cause of diseases classified elsewhere: Secondary | ICD-10-CM

## 2016-10-01 DIAGNOSIS — R112 Nausea with vomiting, unspecified: Secondary | ICD-10-CM | POA: Diagnosis not present

## 2016-10-01 DIAGNOSIS — R51 Headache: Secondary | ICD-10-CM

## 2016-10-01 MED ORDER — PREDNISONE 20 MG PO TABS: ORAL_TABLET | ORAL | 0 refills | Status: DC

## 2016-10-01 MED ORDER — ONDANSETRON 4 MG PO TBDP
4.0000 mg | ORAL_TABLET | Freq: Once | ORAL | Status: AC
  Administered 2016-10-01: 4 mg via ORAL

## 2016-10-01 MED ORDER — ONDANSETRON HCL 4 MG PO TABS: 4.0000 mg | ORAL_TABLET | Freq: Four times a day (QID) | ORAL | 0 refills | Status: DC

## 2016-10-01 MED ORDER — AMOXICILLIN 500 MG PO CAPS: 500.0000 mg | ORAL_CAPSULE | Freq: Three times a day (TID) | ORAL | 0 refills | Status: DC

## 2016-10-01 NOTE — ED Triage Notes (Addendum)
Pt states she's having nausea and vomiting since this a.m., also having "burning" headache. Tessalon pearls not working and she's had to use her rescue inhaler 3 times today. Has also tried OTC benadryl and mucinex.

## 2016-10-01 NOTE — Discharge Instructions (Signed)

## 2016-10-01 NOTE — ED Provider Notes (Signed)
CSN: [id number]     Arrival date & time 10/01/16  1744 History   First MD Initiated Contact with Patient 10/01/16 1803     Chief Complaint  Patient presents with  . Cough  . Dizziness  . Nausea  . Emesis   (Consider location/radiation/quality/duration/timing/severity/associated sxs/prior Treatment) HPI Shalawn Wynder is a 26 y.o. female presenting to UC with c/o worsening intermittently productive cough that she was seen for at [facility] on 09/28/16 that had started 2 days prior.  She has since developed a "burning" headache that is 7/10, dizziness, nausea and vomited 2-3 times today. Associated soft stools but no watery diarrhea. Denies sick contacts. She has tried tylenol and excedrin today w/o much relief but has been able to keep down a few sips of water.  She has also tried benadryl and mucinex with mild relief.   Hx of asthma. She has needed to use her inhaler 3 times today. She states the tessalon pearls prescribed 3 days ago have not helped.    Past Medical History:  Diagnosis Date  . Asthma   . Seasonal allergies   . Swine flu 2009   Past Surgical History:  Procedure Laterality Date  . APPENDECTOMY  1996  . ORIF DISTAL RADIUS FRACTURE    . WISDOM TOOTH EXTRACTION     Family History  Problem Relation Age of Onset  . Colon polyps Father   . Diabetes Father   . Diverticulitis Father   . Cancer Father   . Arrhythmia Brother     half-brother  . Diabetes Maternal Aunt   . Cancer Maternal Grandmother     SKIN  . Cancer Maternal Grandfather     SKIN  . Hypertension Paternal Grandmother   . Hypertension Paternal Grandfather   . Cancer Paternal Grandfather     MELANOMA   Social History  Substance Use Topics  . Smoking status: Former Games developer  . Smokeless tobacco: Never Used  . Alcohol use Yes     Comment: rare   OB History    Gravida Para Term Preterm AB Living   0             SAB TAB Ectopic Multiple Live Births                 Review of Systems  Constitutional: Negative  for chills and fever.  HENT: Positive for congestion, ear pain and sore throat. Negative for trouble swallowing and voice change.   Respiratory: Positive for cough and chest tightness. Negative for shortness of breath.   Cardiovascular: Negative for chest pain and palpitations.  Gastrointestinal: Positive for diarrhea ( soft stool), nausea and vomiting. Negative for abdominal pain and blood in stool.  Musculoskeletal: Negative for arthralgias, back pain and myalgias.  Skin: Negative for rash.  Neurological: Positive for headaches. Negative for dizziness and light-headedness.    Allergies  Azithromycin; Cayenne pepper [cayenne]; and Doxycycline  Home Medications   Prior to Admission medications   Medication Sig Start Date End Date Taking? Authorizing Provider  albuterol (PROVENTIL HFA;VENTOLIN HFA) 108 (90 Base) MCG/ACT inhaler Inhale into the lungs every 6 (six) hours as needed for wheezing or shortness of breath.   Yes Historical Provider, MD  benzonatate (TESSALON) 100 MG capsule Take 1-2 capsules (100-200 mg total) by mouth every 8 (eight) hours. 09/28/16  Yes Junius Finner, PA-C  drospirenone-ethinyl estradiol (YAZ,GIANVI,LORYNA) 3-0.02 MG tablet Take 1 tablet by mouth daily.   Yes Historical Provider, MD  pantoprazole (PROTONIX) 40 MG tablet Take  40 mg by mouth daily.   Yes Historical Provider, MD  amoxicillin (AMOXIL) 500 MG capsule Take 1 capsule (500 mg total) by mouth 3 (three) times daily. 10/01/16   Junius Finner, PA-C  ondansetron (ZOFRAN) 4 MG tablet Take 1 tablet (4 mg total) by mouth every 6 (six) hours. 10/01/16   Junius Finner, PA-C  predniSONE (DELTASONE) 20 MG tablet 3 tabs po day one, then 2 po daily x 4 days 10/01/16   Junius Finner, PA-C   Meds Ordered and Administered this Visit   Medications  ondansetron (ZOFRAN-ODT) disintegrating tablet 4 mg (4 mg Oral Given 10/01/16 1810)    BP 118/80 (BP Location: Left Arm)   Pulse 66   Temp 98.1 F (36.7 C) (Oral)   Wt 125 lb  (56.7 kg)   SpO2 98%   BMI 23.62 kg/m  No data found.   Physical Exam  Constitutional: She is oriented to person, place, and time. She appears well-developed and well-nourished. No distress.  HENT:  Head: Normocephalic and atraumatic.  Right Ear: Tympanic membrane normal.  Left Ear: Tympanic membrane normal.  Nose: Mucosal edema present. Right sinus exhibits maxillary sinus tenderness and frontal sinus tenderness. Left sinus exhibits maxillary sinus tenderness and frontal sinus tenderness.  Mouth/Throat: Uvula is midline and mucous membranes are normal. Posterior oropharyngeal erythema present. No oropharyngeal exudate, posterior oropharyngeal edema or tonsillar abscesses.  Eyes: Conjunctivae and EOM are normal. Pupils are equal, round, and reactive to light.  Neck: Normal range of motion. Neck supple.  Cardiovascular: Normal rate and regular rhythm.   Pulmonary/Chest: Effort normal and breath sounds normal. No stridor. No respiratory distress. She has no wheezes. She has no rales.  Musculoskeletal: Normal range of motion.  Lymphadenopathy:    She has no cervical adenopathy.  Neurological: She is alert and oriented to person, place, and time.  Skin: Skin is warm. She is not diaphoretic.  Psychiatric: She has a normal mood and affect. Her behavior is normal.  Nursing note and vitals reviewed.   Urgent Care Course     Procedures (including critical care time)  Labs Review Labs Reviewed - No data to display  Imaging Review No results found.    MDM   1. Nausea and vomiting in adult patient   2. Frontal headache   3. Bacterial upper respiratory infection    Pt c/o worsening URI symptoms that have developed into frontal headache, nausea, vomiting and worsening cough.  Will cover for bacterial infection. Rx: amoxicillin, prednisone, and zofran  f/u with PCP in 5-7 days if not improving. Sooner if worsening.    Junius Finner, PA-C 10/01/16 1821

## 2016-10-08 DIAGNOSIS — R413 Other amnesia: Secondary | ICD-10-CM | POA: Diagnosis not present

## 2016-10-08 DIAGNOSIS — F430 Acute stress reaction: Secondary | ICD-10-CM | POA: Diagnosis not present

## 2016-11-25 DIAGNOSIS — A084 Viral intestinal infection, unspecified: Secondary | ICD-10-CM | POA: Diagnosis not present

## 2017-02-07 ENCOUNTER — Emergency Department (INDEPENDENT_AMBULATORY_CARE_PROVIDER_SITE_OTHER)
Admission: EM | Admit: 2017-02-07 | Discharge: 2017-02-07 | Disposition: A | Discharge status: Home / Self Care | Payer: BLUE CROSS/BLUE SHIELD | Source: Home / Self Care | Attending: Family Medicine | Admitting: Family Medicine

## 2017-02-07 DIAGNOSIS — H578 Other specified disorders of eye and adnexa: Secondary | ICD-10-CM | POA: Diagnosis not present

## 2017-02-07 DIAGNOSIS — H5789 Other specified disorders of eye and adnexa: Secondary | ICD-10-CM

## 2017-02-07 MED ORDER — SULFACETAMIDE SODIUM 10 % OP SOLN: 1.0000 [drp] | OPHTHALMIC | 0 refills | Status: DC

## 2017-02-07 NOTE — ED Triage Notes (Signed)
Pt c/o RT eye pain after attempting to remove her contact and it tore in half. She reports that Half of the contact is still in her eye.

## 2017-02-07 NOTE — ED Provider Notes (Signed)
[facility]    CSN: [id number] Arrival date & time: 02/07/17  1802     History   Chief Complaint Chief Complaint  Patient presents with  . Eye Problem    HPI Barbara Sellers is a 26 y.o. female.   Patient reports that about one hour ago she attempted to remove her right soft contact lens and it tore in half.  She has been unable to retrieve the remaining half.  She has a foreign body sensation in her right eye but no significant pain.   The history is provided by the patient.  Eye Problem  Location:  Right eye Quality:  Foreign body sensation Severity:  Mild Onset quality:  Sudden Duration:  1 hour Timing:  Constant Progression:  Unchanged Chronicity:  New Context: contact lenses   Relieved by:  Nothing Worsened by:  Eye movement Ineffective treatments: contact removal. Associated symptoms: blurred vision and tearing   Associated symptoms: no discharge, no headaches, no nausea, no photophobia and no redness     Past Medical History:  Diagnosis Date  . Asthma   . Seasonal allergies   . Swine flu 2009    There are no active problems to display for this patient.   Past Surgical History:  Procedure Laterality Date  . APPENDECTOMY  1996  . ORIF DISTAL RADIUS FRACTURE    . WISDOM TOOTH EXTRACTION      OB History    Gravida Para Term Preterm AB Living   0             SAB TAB Ectopic Multiple Live Births                   Home Medications    Prior to Admission medications   Medication Sig Start Date End Date Taking? Authorizing Provider  albuterol (PROVENTIL HFA;VENTOLIN HFA) 108 (90 Base) MCG/ACT inhaler Inhale into the lungs every 6 (six) hours as needed for wheezing or shortness of breath.    [provider]  drospirenone-ethinyl estradiol (YAZ,GIANVI,LORYNA) 3-0.02 MG tablet Take 1 tablet by mouth daily.    [provider]  ondansetron (ZOFRAN) 4 MG tablet Take 1 tablet (4 mg total) by mouth every 6 (six) hours. 10/01/16    Lurene ShadowPhelps, Erin O, PA-C  pantoprazole (PROTONIX) 40 MG tablet Take 40 mg by mouth daily.    [provider]  sulfacetamide (BLEPH-10) 10 % ophthalmic solution Place 1-2 drops into the right eye every 3 (three) hours while awake. 02/07/17   Lattie HawBeese, Stephen A, MD    Family History Family History  Problem Relation Age of Onset  . Colon polyps Father   . Diabetes Father   . Diverticulitis Father   . Cancer Father   . Arrhythmia Brother        half-brother  . Diabetes Maternal Aunt   . Cancer Maternal Grandmother        SKIN  . Cancer Maternal Grandfather        SKIN  . Hypertension Paternal Grandmother   . Hypertension Paternal Grandfather   . Cancer Paternal Grandfather        MELANOMA    Social History Social History  Substance Use Topics  . Smoking status: Former Games developermoker  . Smokeless tobacco: Never Used  . Alcohol use Yes     Comment: rare     Allergies   Azithromycin; Cayenne pepper [cayenne]; and Doxycycline   Review of Systems Review of Systems  Eyes: Positive for blurred vision and  visual disturbance. Negative for photophobia, discharge and redness.  Gastrointestinal: Negative for nausea.  Neurological: Negative for headaches.  All other systems reviewed and are negative.    Physical Exam Triage Vital Signs ED Triage Vitals  Enc Vitals Group     BP 02/07/17 1827 118/72     Pulse Rate 02/07/17 1827 80     Resp 02/07/17 1827 16     Temp 02/07/17 1827 98.3 F (36.8 C)     Temp Source 02/07/17 1827 Oral     SpO2 02/07/17 1827 97 %     Weight 02/07/17 1827 129 lb (58.5 kg)     Height 02/07/17 1827 5' (1.524 m)     Head Circumference --      Peak Flow --      Pain Score 02/07/17 1828 7     Pain Loc --      Pain Edu? --      Excl. in GC? --    No data found.   Updated Vital Signs BP 118/72 (BP Location: Left Arm)   Pulse 80   Temp 98.3 F (36.8 C) (Oral)   Resp 16   Ht 5' (1.524 m)   Wt 129 lb (58.5 kg)   SpO2 97%   BMI 25.19 kg/m    Visual Acuity Right Eye Distance:   Left Eye Distance:   Bilateral Distance:    Right Eye Near:   Left Eye Near:    Bilateral Near:     Physical Exam  Constitutional: She appears well-developed and well-nourished. No distress.  HENT:  Head: Normocephalic.  Right Ear: External ear normal.  Left Ear: External ear normal.  Nose: Nose normal.  Mouth/Throat: Oropharynx is clear and moist.  Eyes: Pupils are equal, round, and reactive to light. Conjunctivae, EOM and lids are normal.    Right eye repeals approximately one-half contact lens over cornea.  Instilled tetracaine opthalmic suspension.  Contact lens fragment easily removed with cotton applicator. Fluorescein shows minimal uptake as noted on diagram.   Nursing note and vitals reviewed.    UC Treatments / Results  Labs (all labs ordered are listed, but only abnormal results are displayed) Labs Reviewed - No data to display  EKG  EKG Interpretation None       Radiology No results found.  Procedures Procedures  Contact lens removal right eye: Instilled tetracaine opthalmic suspension.  Contact lens fragment easily removed with cotton applicator.   Medications Ordered in UC Medications - No data to display   Initial Impression / Assessment and Plan / UC Course  I have reviewed the triage vital signs and the nursing notes.  Pertinent labs & imaging results that were available during my care of the patient were reviewed by me and considered in my medical decision making (see chart for details).    Begin sulfacetamide ophthalmic suspension for 2 to 3 days until eye discomfort resolves. Avoid using contact lens until right eye healed. Followup with ophthalmologist if not improved 3 days.    Final Clinical Impressions(s) / UC Diagnoses   Final diagnoses:  Contact lens stuck    New Prescriptions New Prescriptions   SULFACETAMIDE (BLEPH-10) 10 % OPHTHALMIC SOLUTION    Place 1-2 drops into the right eye  every 3 (three) hours while awake.         Lattie Haw, MD 02/16/17 [phone]

## 2017-02-07 NOTE — Discharge Instructions (Signed)
Avoid using contact lens until right eye healed.

## 2017-02-18 DIAGNOSIS — J209 Acute bronchitis, unspecified: Secondary | ICD-10-CM | POA: Diagnosis not present

## 2017-03-17 DIAGNOSIS — R0781 Pleurodynia: Secondary | ICD-10-CM | POA: Diagnosis not present

## 2017-03-17 DIAGNOSIS — R0789 Other chest pain: Secondary | ICD-10-CM | POA: Diagnosis not present

## 2017-04-11 ENCOUNTER — Encounter: Payer: Self-pay | Admitting: Genetic Counselor

## 2017-04-11 ENCOUNTER — Telehealth: Payer: Self-pay | Admitting: Genetic Counselor

## 2017-04-11 NOTE — Telephone Encounter (Signed)
Appt has been scheduled for the pt to see Ofri on 10/25 at 11am. Unable to reach the pt because her vm is full. Letter mailed to the pt and faxed to the referring to notify the pt.

## 2017-04-19 ENCOUNTER — Telehealth: Payer: Self-pay | Admitting: *Deleted

## 2017-04-19 NOTE — Telephone Encounter (Signed)
"  I was referred by Dr. Kateri PlummerMorrow.  Have an appointment on the 25 th.  Do you all take my insurance?"  [facility]vised [facility] takes all insurances.  Noted genetic appointment.  Reviewed she will meet counselor followed by lab work after this assessment.  Lab test coverage dependent upon insurance protocol.  "Can I cancel the appointment until I have time to contact my insurance?" Call transferred to reschedule.

## 2017-04-21 ENCOUNTER — Other Ambulatory Visit: Payer: BLUE CROSS/BLUE SHIELD

## 2017-07-11 DIAGNOSIS — Z880 Allergy status to penicillin: Secondary | ICD-10-CM | POA: Diagnosis not present

## 2017-07-11 DIAGNOSIS — J302 Other seasonal allergic rhinitis: Secondary | ICD-10-CM | POA: Diagnosis not present

## 2017-07-11 DIAGNOSIS — R112 Nausea with vomiting, unspecified: Secondary | ICD-10-CM | POA: Diagnosis not present

## 2017-07-11 DIAGNOSIS — J45909 Unspecified asthma, uncomplicated: Secondary | ICD-10-CM | POA: Diagnosis not present

## 2017-07-11 DIAGNOSIS — R509 Fever, unspecified: Secondary | ICD-10-CM | POA: Diagnosis not present

## 2017-07-11 DIAGNOSIS — R070 Pain in throat: Secondary | ICD-10-CM | POA: Diagnosis not present

## 2017-07-11 DIAGNOSIS — J029 Acute pharyngitis, unspecified: Secondary | ICD-10-CM | POA: Diagnosis not present

## 2017-07-11 DIAGNOSIS — R5383 Other fatigue: Secondary | ICD-10-CM | POA: Diagnosis not present

## 2017-07-11 DIAGNOSIS — Z881 Allergy status to other antibiotic agents status: Secondary | ICD-10-CM | POA: Diagnosis not present

## 2017-07-19 DIAGNOSIS — F4322 Adjustment disorder with anxiety: Secondary | ICD-10-CM | POA: Diagnosis not present

## 2017-08-02 DIAGNOSIS — F4322 Adjustment disorder with anxiety: Secondary | ICD-10-CM | POA: Diagnosis not present

## 2017-08-11 ENCOUNTER — Other Ambulatory Visit: Payer: Self-pay | Admitting: Nurse Practitioner

## 2017-08-11 ENCOUNTER — Other Ambulatory Visit (HOSPITAL_COMMUNITY)
Admission: RE | Admit: 2017-08-11 | Discharge: 2017-08-11 | Disposition: A | Discharge status: Home / Self Care | Payer: BLUE CROSS/BLUE SHIELD | Source: Ambulatory Visit | Attending: Obstetrics and Gynecology | Admitting: Obstetrics and Gynecology

## 2017-08-11 DIAGNOSIS — Z01419 Encounter for gynecological examination (general) (routine) without abnormal findings: Secondary | ICD-10-CM | POA: Insufficient documentation

## 2017-08-11 DIAGNOSIS — R102 Pelvic and perineal pain: Secondary | ICD-10-CM | POA: Diagnosis not present

## 2017-08-11 DIAGNOSIS — Z3041 Encounter for surveillance of contraceptive pills: Secondary | ICD-10-CM | POA: Diagnosis not present

## 2017-08-12 LAB — CYTOLOGY - PAP: Diagnosis: NEGATIVE

## 2017-08-16 DIAGNOSIS — F4322 Adjustment disorder with anxiety: Secondary | ICD-10-CM | POA: Diagnosis not present

## 2017-08-30 DIAGNOSIS — F4322 Adjustment disorder with anxiety: Secondary | ICD-10-CM | POA: Diagnosis not present

## 2017-09-16 DIAGNOSIS — F4322 Adjustment disorder with anxiety: Secondary | ICD-10-CM | POA: Diagnosis not present

## 2017-09-27 DIAGNOSIS — F4322 Adjustment disorder with anxiety: Secondary | ICD-10-CM | POA: Diagnosis not present

## 2017-10-11 DIAGNOSIS — F4322 Adjustment disorder with anxiety: Secondary | ICD-10-CM | POA: Diagnosis not present

## 2017-10-25 DIAGNOSIS — F4322 Adjustment disorder with anxiety: Secondary | ICD-10-CM | POA: Diagnosis not present

## 2017-11-11 DIAGNOSIS — F4322 Adjustment disorder with anxiety: Secondary | ICD-10-CM | POA: Diagnosis not present

## 2017-11-25 DIAGNOSIS — F4322 Adjustment disorder with anxiety: Secondary | ICD-10-CM | POA: Diagnosis not present

## 2017-11-27 ENCOUNTER — Encounter: Payer: Self-pay | Admitting: Emergency Medicine

## 2017-11-27 ENCOUNTER — Emergency Department
Admission: EM | Admit: 2017-11-27 | Discharge: 2017-11-27 | Disposition: A | Discharge status: Home / Self Care | Payer: BLUE CROSS/BLUE SHIELD | Source: Home / Self Care | Attending: Family Medicine | Admitting: Family Medicine

## 2017-11-27 DIAGNOSIS — R519 Headache, unspecified: Secondary | ICD-10-CM

## 2017-11-27 DIAGNOSIS — R112 Nausea with vomiting, unspecified: Secondary | ICD-10-CM

## 2017-11-27 DIAGNOSIS — R51 Headache: Secondary | ICD-10-CM

## 2017-11-27 HISTORY — DX: Dizziness and giddiness: R42

## 2017-11-27 HISTORY — DX: Migraine, unspecified, not intractable, without status migrainosus: G43.909

## 2017-11-27 HISTORY — DX: Rheumatoid arthritis, unspecified: M06.9

## 2017-11-27 MED ORDER — KETOROLAC TROMETHAMINE 60 MG/2ML IM SOLN
60.0000 mg | Freq: Once | INTRAMUSCULAR | Status: AC
  Administered 2017-11-27: 60 mg via INTRAMUSCULAR

## 2017-11-27 MED ORDER — METOCLOPRAMIDE HCL 5 MG/ML IJ SOLN
5.0000 mg | Freq: Once | INTRAMUSCULAR | Status: AC
  Administered 2017-11-27: 5 mg via INTRAMUSCULAR

## 2017-11-27 MED ORDER — DEXAMETHASONE SODIUM PHOSPHATE 10 MG/ML IJ SOLN
10.0000 mg | Freq: Once | INTRAMUSCULAR | Status: AC
  Administered 2017-11-27: 10 mg via INTRAMUSCULAR

## 2017-11-27 NOTE — ED Triage Notes (Signed)
Pt c/o migraine x 3 days, no relief with Excedrin Migraine, does not have neurologists, dizziness, vomiting, light sensitivity, flashes of light.

## 2017-11-27 NOTE — ED Provider Notes (Signed)
[facility]    CSN: [id number] Arrival date & time: 11/27/17  1343     History   Chief Complaint Chief Complaint  Patient presents with  . Migraine    HPI Barbara Sellers is a 27 y.o. female.   HPI  Barbara Sellers is a 27 y.o. female presenting to UC with c/o a gradually worsening persistent HA that started 3 days ago.  She has taken Excedrin Migraine, which typically helps, but no relief.  She has had mild dizziness with nausea, vomiting, and photophobia.  No official dx of migraines but she does have a family hx of migraines.  She has had a similar HA 2 other times within the last 6 months. One was due to exposure from carbon monoxide and the other time she believes started as a tension headache but developed into Bell's Palsy, which has since resolved.  Denies fever, chills, cough, congestion. No weakness or numbness in arms or legs. No recent head trauma.    Past Medical History:  Diagnosis Date  . Asthma   . Migraines   . Rheumatoid arthritis (HCC)   . Seasonal allergies   . Swine flu 2009  . Vertigo     There are no active problems to display for this patient.   Past Surgical History:  Procedure Laterality Date  . APPENDECTOMY  1996  . ORIF DISTAL RADIUS FRACTURE    . WISDOM TOOTH EXTRACTION      OB History    Gravida  0   Para      Term      Preterm      AB      Living        SAB      TAB      Ectopic      Multiple      Live Births               Home Medications    Prior to Admission medications   Medication Sig Start Date End Date Taking? Authorizing Provider  albuterol (PROVENTIL HFA;VENTOLIN HFA) 108 (90 Base) MCG/ACT inhaler Inhale into the lungs every 6 (six) hours as needed for wheezing or shortness of breath.    [provider]  drospirenone-ethinyl estradiol (YAZ,GIANVI,LORYNA) 3-0.02 MG tablet Take 1 tablet by mouth daily.    [provider]  pantoprazole (PROTONIX) 40 MG tablet Take 40 mg by mouth  daily.    [provider]    Family History Family History  Problem Relation Age of Onset  . Colon polyps Father   . Diabetes Father   . Diverticulitis Father   . Cancer Father   . Arrhythmia Brother        half-brother  . Diabetes Maternal Aunt   . Cancer Maternal Grandmother        SKIN  . Cancer Maternal Grandfather        SKIN  . Hypertension Paternal Grandmother   . Hypertension Paternal Grandfather   . Cancer Paternal Grandfather        MELANOMA    Social History Social History   Tobacco Use  . Smoking status: Former Games developer  . Smokeless tobacco: Never Used  Substance Use Topics  . Alcohol use: Yes    Comment: rare  . Drug use: No     Allergies   Azithromycin; Cayenne pepper [cayenne]; and Doxycycline   Review of Systems Review of Systems  Constitutional: Negative for chills and fever.  HENT: Negative for  congestion.   Eyes: Positive for photophobia. Negative for pain and visual disturbance.  Respiratory: Negative for cough.   Gastrointestinal: Positive for nausea and vomiting.  Musculoskeletal: Negative for neck pain and neck stiffness.  Skin: Negative for rash.  Neurological: Positive for dizziness and headaches. Negative for tremors, seizures, syncope, facial asymmetry, speech difficulty, weakness, light-headedness and numbness.     Physical Exam Triage Vital Signs ED Triage Vitals  Enc Vitals Group     BP 11/27/17 1419 118/75     Pulse Rate 11/27/17 1419 61     Resp --      Temp 11/27/17 1419 98.4 F (36.9 C)     Temp Source 11/27/17 1419 Oral     SpO2 11/27/17 1419 97 %     Weight 11/27/17 1420 130 lb (59 kg)     Height 11/27/17 1420 5\' 1"  (1.549 m)     Head Circumference --      Peak Flow --      Pain Score 11/27/17 1419 7     Pain Loc --      Pain Edu? --      Excl. in GC? --    No data found.  Updated Vital Signs BP 118/75 (BP Location: Right Arm)   Pulse 61   Temp 98.4 F (36.9 C) (Oral)   Ht 5\' 1"  (1.549 m)   Wt  130 lb (59 kg)   SpO2 97%   BMI 24.56 kg/m   Visual Acuity Right Eye Distance:   Left Eye Distance:   Bilateral Distance:    Right Eye Near:   Left Eye Near:    Bilateral Near:     Physical Exam  Constitutional: She is oriented to person, place, and time. She appears well-developed and well-nourished. No distress.  HENT:  Head: Normocephalic and atraumatic.  Right Ear: Tympanic membrane normal.  Left Ear: Tympanic membrane normal.  Nose: Nose normal.  Mouth/Throat: Uvula is midline, oropharynx is clear and moist and mucous membranes are normal.  Eyes: Pupils are equal, round, and reactive to light. Conjunctivae and EOM are normal. Right eye exhibits no discharge. Left eye exhibits no discharge. No scleral icterus.  Neck: Normal range of motion. Neck supple.  No nuchal rigidity or meningeal signs.  Cardiovascular: Normal rate and regular rhythm.  Pulmonary/Chest: Effort normal and breath sounds normal. No stridor. No respiratory distress. She has no wheezes. She has no rales.  Musculoskeletal: Normal range of motion.  Neurological: She is alert and oriented to person, place, and time. No cranial nerve deficit.  CN II-XII in tact. Speech is clear. Face is symmetric. Normal gait.   Skin: Skin is warm and dry. She is not diaphoretic.  Psychiatric: She has a normal mood and affect. Her behavior is normal.  Nursing note and vitals reviewed.    UC Treatments / Results  Labs (all labs ordered are listed, but only abnormal results are displayed) Labs Reviewed - No data to display  EKG None  Radiology No results found.  Procedures Procedures (including critical care time)  Medications Ordered in UC Medications  ketorolac (TORADOL) injection 60 mg (60 mg Intramuscular Given 11/27/17 1451)  dexamethasone (DECADRON) injection 10 mg (10 mg Intramuscular Given 11/27/17 1451)  metoCLOPramide (REGLAN) injection 5 mg (5 mg Intramuscular Given 11/27/17 1452)    Initial Impression /  Assessment and Plan / UC Course  I have reviewed the triage vital signs and the nursing notes.  Pertinent labs & imaging results that were available  during my care of the patient were reviewed by me and considered in my medical decision making (see chart for details).     Hx and exam c/w migraine  Doubt CVA or SAH Migraine cocktail given, provided moderate relief for pt.   Final Clinical Impressions(s) / UC Diagnoses   Final diagnoses:  Bad headache  Nausea and vomiting in adult patient     Discharge Instructions      Be sure to stay well hydrated and getting plenty of rest. Too little of either can contribute to bad headaches.  Please follow up with your family doctor to discuss recurrent bad headaches as she may want to have you follow up with a headache specialist. Especially if you have a family history of migraines.     ED Prescriptions    None     Controlled Substance Prescriptions [place] Controlled Substance Registry consulted? Not Applicable   Rolla Platehelps, Pegi Milazzo O, PA-C 11/27/17 1537

## 2017-11-27 NOTE — Discharge Instructions (Signed)
°  Be sure to stay well hydrated and getting plenty of rest. Too little of either can contribute to bad headaches.  Please follow up with your family doctor to discuss recurrent bad headaches as she may want to have you follow up with a headache specialist. Especially if you have a family history of migraines.

## 2017-11-30 DIAGNOSIS — G43909 Migraine, unspecified, not intractable, without status migrainosus: Secondary | ICD-10-CM | POA: Diagnosis not present

## 2017-12-01 ENCOUNTER — Encounter: Payer: Self-pay | Admitting: Neurology

## 2017-12-06 DIAGNOSIS — F4322 Adjustment disorder with anxiety: Secondary | ICD-10-CM | POA: Diagnosis not present

## 2017-12-12 DIAGNOSIS — R51 Headache: Secondary | ICD-10-CM | POA: Diagnosis not present

## 2018-01-03 DIAGNOSIS — Z0000 Encounter for general adult medical examination without abnormal findings: Secondary | ICD-10-CM | POA: Diagnosis not present

## 2018-01-23 DIAGNOSIS — F4322 Adjustment disorder with anxiety: Secondary | ICD-10-CM | POA: Diagnosis not present

## 2018-02-06 ENCOUNTER — Ambulatory Visit: Payer: BLUE CROSS/BLUE SHIELD | Admitting: Neurology

## 2018-02-06 ENCOUNTER — Encounter

## 2018-02-09 DIAGNOSIS — F4322 Adjustment disorder with anxiety: Secondary | ICD-10-CM | POA: Diagnosis not present

## 2018-02-23 DIAGNOSIS — F4322 Adjustment disorder with anxiety: Secondary | ICD-10-CM | POA: Diagnosis not present

## 2018-03-06 DIAGNOSIS — F4322 Adjustment disorder with anxiety: Secondary | ICD-10-CM | POA: Diagnosis not present

## 2018-03-15 DIAGNOSIS — G43809 Other migraine, not intractable, without status migrainosus: Secondary | ICD-10-CM | POA: Diagnosis not present

## 2018-03-15 DIAGNOSIS — F4310 Post-traumatic stress disorder, unspecified: Secondary | ICD-10-CM | POA: Diagnosis not present

## 2018-03-23 DIAGNOSIS — F4322 Adjustment disorder with anxiety: Secondary | ICD-10-CM | POA: Diagnosis not present

## 2018-03-29 DIAGNOSIS — F4310 Post-traumatic stress disorder, unspecified: Secondary | ICD-10-CM | POA: Diagnosis not present

## 2018-04-10 DIAGNOSIS — F4322 Adjustment disorder with anxiety: Secondary | ICD-10-CM | POA: Diagnosis not present

## 2018-04-28 DIAGNOSIS — F4323 Adjustment disorder with mixed anxiety and depressed mood: Secondary | ICD-10-CM | POA: Diagnosis not present

## 2018-05-08 ENCOUNTER — Emergency Department
Admission: EM | Admit: 2018-05-08 | Discharge: 2018-05-08 | Disposition: A | Discharge status: Home / Self Care | Payer: BLUE CROSS/BLUE SHIELD | Source: Home / Self Care | Attending: Family Medicine | Admitting: Family Medicine

## 2018-05-08 ENCOUNTER — Other Ambulatory Visit: Payer: Self-pay

## 2018-05-08 DIAGNOSIS — R197 Diarrhea, unspecified: Secondary | ICD-10-CM | POA: Diagnosis not present

## 2018-05-08 DIAGNOSIS — R112 Nausea with vomiting, unspecified: Secondary | ICD-10-CM

## 2018-05-08 MED ORDER — PROMETHAZINE HCL 25 MG PO TABS: 25.0000 mg | ORAL_TABLET | Freq: Four times a day (QID) | ORAL | 0 refills | Status: DC | PRN

## 2018-05-08 NOTE — ED Triage Notes (Signed)
At work yesterday around 330 pm, and started with diarrhea.  Vomiting by the time she go home.  Still having both sx.

## 2018-05-08 NOTE — Discharge Instructions (Signed)
°  Be sure to get a lot of rest and stay well hydrated with sports drinks, water, diluted juices, and clear sodas.  Avoid fried fatty food, spicy food, and milk as these foods can cause worsening stomach upset.  ° °Please follow up with family medicine in 3-4 days if not improving, sooner if worsening.  °

## 2018-05-08 NOTE — ED Provider Notes (Signed)
[facility]    CSN: [id number] Arrival date & time: 05/08/18  4098     History   Chief Complaint Chief Complaint  Patient presents with  . Emesis  . Diarrhea    HPI Seretha Estabrooks is a 27 y.o. female.   HPI  Ayelen Sciortino is a 27 y.o. female presenting to UC with c/o n/v/d that started yesterday.  She has had about 15 episodes of vomiting and about the same amount of diarrhea. Last episode of vomiting was about 1 hour PTA despite taking Zofran. She has also taken Peptobismol with mild relief.   Pt believes she vomited up the zofran.  Her wife is also in UC to be seen for the same symptoms. Pt's wife notes they may have eaten bad chicken was it was initially underdone but was placed back in the oven and looked well cooked by the time they ate it. No recent travel.    Past Medical History:  Diagnosis Date  . Asthma   . Migraines   . Rheumatoid arthritis (HCC)   . Seasonal allergies   . Swine flu 2009  . Vertigo     There are no active problems to display for this patient.   Past Surgical History:  Procedure Laterality Date  . APPENDECTOMY  1996  . ORIF DISTAL RADIUS FRACTURE    . WISDOM TOOTH EXTRACTION      OB History    Gravida  0   Para      Term      Preterm      AB      Living        SAB      TAB      Ectopic      Multiple      Live Births               Home Medications    Prior to Admission medications   Medication Sig Start Date End Date Taking? Authorizing Provider  albuterol (PROVENTIL HFA;VENTOLIN HFA) 108 (90 Base) MCG/ACT inhaler Inhale into the lungs every 6 (six) hours as needed for wheezing or shortness of breath.    [provider]  busPIRone (BUSPAR) 15 MG tablet Take 15 mg by mouth 2 (two) times daily. 04/10/18   [provider]  drospirenone-ethinyl estradiol (YAZ,GIANVI,LORYNA) 3-0.02 MG tablet Take 1 tablet by mouth daily.    [provider]  pantoprazole (PROTONIX) 40 MG tablet Take  40 mg by mouth daily.    [provider]  promethazine (PHENERGAN) 25 MG tablet Take 1 tablet (25 mg total) by mouth every 6 (six) hours as needed. 05/08/18   Lurene Shadow, PA-C    Family History Family History  Problem Relation Age of Onset  . Colon polyps Father   . Diabetes Father   . Diverticulitis Father   . Cancer Father   . Arrhythmia Brother        half-brother  . Diabetes Maternal Aunt   . Cancer Maternal Grandmother        SKIN  . Cancer Maternal Grandfather        SKIN  . Hypertension Paternal Grandmother   . Hypertension Paternal Grandfather   . Cancer Paternal Grandfather        MELANOMA    Social History Social History   Tobacco Use  . Smoking status: Former Games developer  . Smokeless tobacco: Never Used  Substance Use Topics  . Alcohol use: Yes  Comment: rare  . Drug use: No     Allergies   Azithromycin; Cayenne pepper [cayenne]; and Doxycycline   Review of Systems Review of Systems  Constitutional: Positive for appetite change and chills. Negative for fatigue and fever.  HENT: Negative for congestion, rhinorrhea and sore throat.   Respiratory: Negative for cough and shortness of breath.   Gastrointestinal: Positive for abdominal pain ( generalized), diarrhea, nausea and vomiting. Negative for blood in stool.  Musculoskeletal: Negative for back pain and myalgias.  Neurological: Negative for dizziness, light-headedness and headaches.     Physical Exam Triage Vital Signs ED Triage Vitals  Enc Vitals Group     BP 05/08/18 0859 (!) 148/84     Pulse Rate 05/08/18 0859 (!) 102     Resp 05/08/18 0859 18     Temp 05/08/18 0859 98 F (36.7 C)     Temp Source 05/08/18 0859 Oral     SpO2 05/08/18 0859 95 %     Weight 05/08/18 0900 138 lb (62.6 kg)     Height 05/08/18 0900 5\' 1"  (1.549 m)     Head Circumference --      Peak Flow --      Pain Score 05/08/18 0900 6     Pain Loc --      Pain Edu? --      Excl. in GC? --    No data  found.  Updated Vital Signs BP (!) 148/84 (BP Location: Right Arm)   Pulse (!) 102   Temp 98 F (36.7 C) (Oral)   Resp 18   Ht 5\' 1"  (1.549 m)   Wt 138 lb (62.6 kg)   LMP 04/07/2018   SpO2 95%   BMI 26.07 kg/m   Visual Acuity Right Eye Distance:   Left Eye Distance:   Bilateral Distance:    Right Eye Near:   Left Eye Near:    Bilateral Near:     Physical Exam  Constitutional: She is oriented to person, place, and time. She appears well-developed and well-nourished. No distress.  HENT:  Head: Normocephalic and atraumatic.  Right Ear: Tympanic membrane normal.  Left Ear: Tympanic membrane normal.  Nose: Nose normal. Right sinus exhibits no maxillary sinus tenderness and no frontal sinus tenderness. Left sinus exhibits no maxillary sinus tenderness and no frontal sinus tenderness.  Mouth/Throat: Uvula is midline, oropharynx is clear and moist and mucous membranes are normal.  Eyes: EOM are normal.  Neck: Normal range of motion. Neck supple.  Cardiovascular: Normal rate and regular rhythm.  Pulmonary/Chest: Effort normal and breath sounds normal. No stridor. No respiratory distress. She has no wheezes. She has no rales.  Abdominal: Soft. She exhibits no distension. There is no tenderness.  Musculoskeletal: Normal range of motion.  Lymphadenopathy:    She has no cervical adenopathy.  Neurological: She is alert and oriented to person, place, and time.  Skin: Skin is warm and dry. She is not diaphoretic.  Psychiatric: She has a normal mood and affect. Her behavior is normal.  Nursing note and vitals reviewed.    UC Treatments / Results  Labs (all labs ordered are listed, but only abnormal results are displayed) Labs Reviewed - No data to display  EKG None  Radiology No results found.  Procedures Procedures (including critical care time)  Medications Ordered in UC Medications - No data to display  Initial Impression / Assessment and Plan / UC Course  I have  reviewed the triage vital signs and the  nursing notes.  Pertinent labs & imaging results that were available during my care of the patient were reviewed by me and considered in my medical decision making (see chart for details).    Hx and exam c/w viral illness Encouraged symptomatic treatment at this time. Pt info packet provided.   Final Clinical Impressions(s) / UC Diagnoses   Final diagnoses:  Nausea vomiting and diarrhea     Discharge Instructions      Be sure to get a lot of rest and stay well hydrated with sports drinks, water, diluted juices, and clear sodas.  Avoid fried fatty food, spicy food, and milk as these foods can cause worsening stomach upset.   Please follow up with family medicine in 3-4 days if not improving, sooner if worsening.    ED Prescriptions    Medication Sig Dispense Auth. Provider   promethazine (PHENERGAN) 25 MG tablet Take 1 tablet (25 mg total) by mouth every 6 (six) hours as needed. 10 tablet Lurene Shadow, PA-C     Controlled Substance Prescriptions [place] Controlled Substance Registry consulted? Not Applicable   Rolla Plate 05/08/18 1112

## 2018-05-10 DIAGNOSIS — F4310 Post-traumatic stress disorder, unspecified: Secondary | ICD-10-CM | POA: Diagnosis not present

## 2018-05-15 DIAGNOSIS — F4323 Adjustment disorder with mixed anxiety and depressed mood: Secondary | ICD-10-CM | POA: Diagnosis not present

## 2018-05-31 DIAGNOSIS — F4323 Adjustment disorder with mixed anxiety and depressed mood: Secondary | ICD-10-CM | POA: Diagnosis not present

## 2018-06-15 DIAGNOSIS — F4323 Adjustment disorder with mixed anxiety and depressed mood: Secondary | ICD-10-CM | POA: Diagnosis not present

## 2018-07-19 DIAGNOSIS — G5622 Lesion of ulnar nerve, left upper limb: Secondary | ICD-10-CM | POA: Insufficient documentation

## 2018-07-19 DIAGNOSIS — F4310 Post-traumatic stress disorder, unspecified: Secondary | ICD-10-CM | POA: Diagnosis not present

## 2018-08-31 DIAGNOSIS — Z01419 Encounter for gynecological examination (general) (routine) without abnormal findings: Secondary | ICD-10-CM | POA: Diagnosis not present

## 2018-09-06 DIAGNOSIS — F4310 Post-traumatic stress disorder, unspecified: Secondary | ICD-10-CM | POA: Diagnosis not present

## 2018-09-14 DIAGNOSIS — R51 Headache: Secondary | ICD-10-CM | POA: Diagnosis not present

## 2018-11-14 DIAGNOSIS — F4310 Post-traumatic stress disorder, unspecified: Secondary | ICD-10-CM | POA: Diagnosis not present

## 2018-12-05 DIAGNOSIS — R51 Headache: Secondary | ICD-10-CM | POA: Diagnosis not present

## 2018-12-05 DIAGNOSIS — R197 Diarrhea, unspecified: Secondary | ICD-10-CM | POA: Diagnosis not present

## 2018-12-05 DIAGNOSIS — Z20828 Contact with and (suspected) exposure to other viral communicable diseases: Secondary | ICD-10-CM | POA: Diagnosis not present

## 2018-12-05 DIAGNOSIS — R05 Cough: Secondary | ICD-10-CM | POA: Diagnosis not present

## 2018-12-06 DIAGNOSIS — R05 Cough: Secondary | ICD-10-CM | POA: Diagnosis not present

## 2018-12-12 DIAGNOSIS — F4310 Post-traumatic stress disorder, unspecified: Secondary | ICD-10-CM | POA: Diagnosis not present

## 2019-02-14 DIAGNOSIS — F4310 Post-traumatic stress disorder, unspecified: Secondary | ICD-10-CM | POA: Diagnosis not present

## 2019-04-02 DIAGNOSIS — Z23 Encounter for immunization: Secondary | ICD-10-CM | POA: Diagnosis not present

## 2019-04-19 DIAGNOSIS — R05 Cough: Secondary | ICD-10-CM | POA: Diagnosis not present

## 2019-04-19 DIAGNOSIS — Z20828 Contact with and (suspected) exposure to other viral communicable diseases: Secondary | ICD-10-CM | POA: Diagnosis not present

## 2019-05-17 DIAGNOSIS — F4310 Post-traumatic stress disorder, unspecified: Secondary | ICD-10-CM | POA: Diagnosis not present

## 2019-06-07 DIAGNOSIS — R5383 Other fatigue: Secondary | ICD-10-CM | POA: Diagnosis not present

## 2019-06-07 DIAGNOSIS — Z20828 Contact with and (suspected) exposure to other viral communicable diseases: Secondary | ICD-10-CM | POA: Diagnosis not present

## 2019-06-08 DIAGNOSIS — J988 Other specified respiratory disorders: Secondary | ICD-10-CM | POA: Diagnosis not present

## 2019-06-08 DIAGNOSIS — R062 Wheezing: Secondary | ICD-10-CM | POA: Diagnosis not present

## 2019-06-17 DIAGNOSIS — Z20828 Contact with and (suspected) exposure to other viral communicable diseases: Secondary | ICD-10-CM | POA: Diagnosis not present

## 2019-06-29 DIAGNOSIS — U071 COVID-19: Secondary | ICD-10-CM | POA: Diagnosis not present

## 2019-06-29 DIAGNOSIS — R509 Fever, unspecified: Secondary | ICD-10-CM | POA: Diagnosis not present

## 2019-06-29 DIAGNOSIS — R05 Cough: Secondary | ICD-10-CM | POA: Diagnosis not present

## 2019-06-29 DIAGNOSIS — R0981 Nasal congestion: Secondary | ICD-10-CM | POA: Diagnosis not present

## 2019-07-05 DIAGNOSIS — U071 COVID-19: Secondary | ICD-10-CM | POA: Diagnosis not present

## 2019-07-11 DIAGNOSIS — Z91018 Allergy to other foods: Secondary | ICD-10-CM | POA: Diagnosis not present

## 2019-07-11 DIAGNOSIS — J45901 Unspecified asthma with (acute) exacerbation: Secondary | ICD-10-CM | POA: Diagnosis not present

## 2019-07-11 DIAGNOSIS — R079 Chest pain, unspecified: Secondary | ICD-10-CM | POA: Diagnosis not present

## 2019-07-11 DIAGNOSIS — U071 COVID-19: Secondary | ICD-10-CM | POA: Diagnosis not present

## 2019-07-11 DIAGNOSIS — R0602 Shortness of breath: Secondary | ICD-10-CM | POA: Diagnosis not present

## 2019-07-11 DIAGNOSIS — Z79899 Other long term (current) drug therapy: Secondary | ICD-10-CM | POA: Diagnosis not present

## 2019-07-11 DIAGNOSIS — Z881 Allergy status to other antibiotic agents status: Secondary | ICD-10-CM | POA: Diagnosis not present

## 2019-07-11 DIAGNOSIS — J4521 Mild intermittent asthma with (acute) exacerbation: Secondary | ICD-10-CM | POA: Diagnosis not present

## 2019-07-11 DIAGNOSIS — R002 Palpitations: Secondary | ICD-10-CM | POA: Diagnosis not present

## 2019-07-25 DIAGNOSIS — U071 COVID-19: Secondary | ICD-10-CM | POA: Diagnosis not present

## 2019-07-25 DIAGNOSIS — J45901 Unspecified asthma with (acute) exacerbation: Secondary | ICD-10-CM | POA: Diagnosis not present

## 2019-08-17 DIAGNOSIS — F4310 Post-traumatic stress disorder, unspecified: Secondary | ICD-10-CM | POA: Diagnosis not present

## 2019-08-30 DIAGNOSIS — Z793 Long term (current) use of hormonal contraceptives: Secondary | ICD-10-CM | POA: Diagnosis not present

## 2019-08-30 DIAGNOSIS — Z888 Allergy status to other drugs, medicaments and biological substances status: Secondary | ICD-10-CM | POA: Diagnosis not present

## 2019-08-30 DIAGNOSIS — G43809 Other migraine, not intractable, without status migrainosus: Secondary | ICD-10-CM | POA: Diagnosis not present

## 2019-08-30 DIAGNOSIS — Z79899 Other long term (current) drug therapy: Secondary | ICD-10-CM | POA: Diagnosis not present

## 2019-08-30 DIAGNOSIS — R519 Headache, unspecified: Secondary | ICD-10-CM | POA: Diagnosis not present

## 2019-08-30 DIAGNOSIS — H539 Unspecified visual disturbance: Secondary | ICD-10-CM | POA: Diagnosis not present

## 2019-08-30 DIAGNOSIS — H53149 Visual discomfort, unspecified: Secondary | ICD-10-CM | POA: Diagnosis not present

## 2019-08-30 DIAGNOSIS — H547 Unspecified visual loss: Secondary | ICD-10-CM | POA: Diagnosis not present

## 2019-08-30 DIAGNOSIS — Z87892 Personal history of anaphylaxis: Secondary | ICD-10-CM | POA: Diagnosis not present

## 2019-08-30 DIAGNOSIS — Z882 Allergy status to sulfonamides status: Secondary | ICD-10-CM | POA: Diagnosis not present

## 2019-09-04 DIAGNOSIS — Z01419 Encounter for gynecological examination (general) (routine) without abnormal findings: Secondary | ICD-10-CM | POA: Diagnosis not present

## 2019-10-23 ENCOUNTER — Ambulatory Visit (INDEPENDENT_AMBULATORY_CARE_PROVIDER_SITE_OTHER): Payer: BC Managed Care – PPO | Admitting: Osteopathic Medicine

## 2019-10-23 ENCOUNTER — Encounter: Payer: Self-pay | Admitting: Osteopathic Medicine

## 2019-10-23 ENCOUNTER — Other Ambulatory Visit: Payer: Self-pay

## 2019-10-23 VITALS — BP 116/79 | HR 73 | Temp 98.6°F | Ht 61.0 in | Wt 146.0 lb

## 2019-10-23 DIAGNOSIS — G4700 Insomnia, unspecified: Secondary | ICD-10-CM | POA: Diagnosis not present

## 2019-10-23 DIAGNOSIS — J4540 Moderate persistent asthma, uncomplicated: Secondary | ICD-10-CM | POA: Diagnosis not present

## 2019-10-23 DIAGNOSIS — K219 Gastro-esophageal reflux disease without esophagitis: Secondary | ICD-10-CM

## 2019-10-23 DIAGNOSIS — F649 Gender identity disorder, unspecified: Secondary | ICD-10-CM

## 2019-10-23 DIAGNOSIS — Z809 Family history of malignant neoplasm, unspecified: Secondary | ICD-10-CM

## 2019-10-23 DIAGNOSIS — R5382 Chronic fatigue, unspecified: Secondary | ICD-10-CM

## 2019-10-23 DIAGNOSIS — J309 Allergic rhinitis, unspecified: Secondary | ICD-10-CM

## 2019-10-23 DIAGNOSIS — Z87891 Personal history of nicotine dependence: Secondary | ICD-10-CM

## 2019-10-23 DIAGNOSIS — Z833 Family history of diabetes mellitus: Secondary | ICD-10-CM | POA: Diagnosis not present

## 2019-10-23 DIAGNOSIS — Z8659 Personal history of other mental and behavioral disorders: Secondary | ICD-10-CM

## 2019-10-23 DIAGNOSIS — G43109 Migraine with aura, not intractable, without status migrainosus: Secondary | ICD-10-CM | POA: Diagnosis not present

## 2019-10-23 HISTORY — DX: Migraine with aura, not intractable, without status migrainosus: G43.109

## 2019-10-23 MED ORDER — UMECLIDINIUM BROMIDE 62.5 MCG/INH IN AEPB: 1.0000 | INHALATION_SPRAY | Freq: Every day | RESPIRATORY_TRACT | 11 refills | Status: DC

## 2019-10-23 MED ORDER — OLOPATADINE HCL 0.2 % OP SOLN: 1.0000 [drp] | Freq: Every day | OPHTHALMIC | 11 refills | Status: DC

## 2019-10-23 NOTE — Progress Notes (Signed)
Barbara Sellers is a 29 y.o. female who presents to  [facility] at [facility]  today, 10/24/19, seeking care for the following: . New to establish care  . Fatigue . Questioning gender identity     ASSESSMENT & PLAN with other pertinent history/findings:  The primary encounter diagnosis was Chronic fatigue. Diagnoses of Ocular migraine, Former smoker, stopped smoking many years ago, Family history of diabetes mellitus in father, History of posttraumatic stress disorder (PTSD), History of ADHD, Asthma in adult, moderate persistent, uncomplicated, Gastroesophageal reflux disease, unspecified whether esophagitis present, Family history of cancer, Allergic sinusitis, Insomnia, unspecified type, and Gender identity disorder were also pertinent to this visit.  Very pleasant patient here to establish care.  Main concern today is significant fatigue and questioning gender identity.  Feels like at some point may want to be on testosterone, currently goes by Shaleigh/she pronouns.  This is causing some conflict with her wife, they had discussed getting pregnant, now not so sure, patient would like referral to counseling to help with this as well as history of other mental health issues namely ADHD, PTSD, MDD/GAD which are currently well controlled with current medications.  History of asthma, needing to use albuterol pretty frequently particularly with exercise, may have something to do with fatigue issues, let us try maintenance inhaler  Allergy issues, has been using what looks like artificial tears, will trial antihistamine/mast cell stabilizer eyedrops and see if this improves things.    Patient Instructions   Plan:  Orders Placed This Encounter  Procedures  . CBC  . COMPLETE METABOLIC PANEL WITH GFR  . Lipid panel  . TSH  . T4, free  . VITAMIN D 25 Hydroxy (Vit-D Deficiency, Fractures)  . Vitamin B12  . Ambulatory referral to Genetics  . Ambulatory  referral to Allergy  . Ambulatory referral to Behavioral Health   Meds ordered this encounter  Medications  . umeclidinium bromide (INCRUSE ELLIPTA) 62.5 MCG/INH AEPB    Sig: Inhale 1 puff into the lungs daily.    Dispense:  30 each    Refill:  11    If not covered please suggest alternative covered ICS, thanks  . Olopatadine HCl 0.2 % SOLN    Sig: Apply 1 drop to eye daily.    Dispense:  2.5 mL    Refill:  11       Orders Placed This Encounter  Procedures  . CBC  . COMPLETE METABOLIC PANEL WITH GFR  . TSH  . T4, free  . Vitamin B12  . Ambulatory referral to Genetics  . Ambulatory referral to Allergy  . Ambulatory referral to Behavioral Health    Meds ordered this encounter  Medications  . umeclidinium bromide (INCRUSE ELLIPTA) 62.5 MCG/INH AEPB    Sig: Inhale 1 puff into the lungs daily.    Dispense:  30 each    Refill:  11    If not covered please suggest alternative covered ICS, thanks  . Olopatadine HCl 0.2 % SOLN    Sig: Apply 1 drop to eye daily.    Dispense:  2.5 mL    Refill:  11    General Preventive Care  Most recent routine screening labs: reviewed from 06/2019 in ER, would recommend baseline lipids/sugars and metabolic panel.   Everyone should have blood pressure checked once per year. Goal 130/80 or less.   Tobacco: don't! Please let me know if you need help quitting!  Alcohol: responsible moderation is ok for  most adults - if you have concerns about your alcohol intake, please talk to me!   Exercise: as tolerated to reduce risk of cardiovascular disease and diabetes. Strength training will also prevent osteoporosis.   Mental health: if need for mental health care (medicines, counseling, other), or concerns about moods, please let me know!   Sexual health: if need for STD testing, or if concerns with libido/pain problems, please let me know!   Advanced Directive: Living Will and/or Healthcare Power of Attorney recommended for all adults,  regardless of age or health.  Vaccines  Flu vaccine: for almost everyone, every fall.   Shingles vaccine: after age 62.   Pneumonia vaccines: after age 16  Tetanus booster: every 10 years / 3rd trimester of pregnancy  HPV vaccine: Gardasil up to age 31 to prevent HPV-associated diseases, including certain cancers.   COVID vaccine: STRONGLY RECOMMENDED  Cancer screenings   Colon cancer screening: for everyone age 54-75.   Breast cancer screening: mammogram at age 93 every other year at least, and annually after age 71.   Cervical cancer screening: Pap every 1 to 5 years depending on age and other risk factors. Can usually stop at age 38 or w/ hysterectomy.   Lung cancer screening: not needed as long as you don't takeup smoking again! Infection screenings  . HIV: recommended screening at least once age 28-65, more often as needed. . Gonorrhea/Chlamydia: screening as needed . Hepatitis C: recommended once for everyone age 8-75 . TB: certain at-risk populations, or depending on work requirements and/or travel history Other . Bone Density Test: recommended for at age 27         Follow-up instructions: Return in about 3 months (around 01/22/2020) for recheck! See me sooner if needed / based on labs .                                         BP 116/79 (BP Location: Left Arm, Patient Position: Sitting, Cuff Size: Normal)   Pulse 73   Temp 98.6 F (37 C) (Oral)   Ht 5\' 1"  (1.549 m)   Wt 146 lb (66.2 kg)   LMP 09/29/2019   BMI 27.59 kg/m   Current Meds  Medication Sig  . albuterol (PROVENTIL HFA;VENTOLIN HFA) 108 (90 Base) MCG/ACT inhaler Inhale into the lungs every 6 (six) hours as needed for wheezing or shortness of breath.  . drospirenone-ethinyl estradiol (YAZ,GIANVI,LORYNA) 3-0.02 MG tablet Take 1 tablet by mouth daily.  Marland Kitchen EPINEPHrine 0.1 MG/0.1ML SOAJ Inject as directed.  . Multiple Vitamins-Minerals (MULTIVITAMIN ADULTS PO)  Take by mouth.  . pantoprazole (PROTONIX) 40 MG tablet Take 40 mg by mouth daily.  . prazosin (MINIPRESS) 1 MG capsule Take by mouth at bedtime as needed.  . sertraline (ZOLOFT) 100 MG tablet Take 100 mg by mouth daily.  . traZODone (DESYREL) 50 MG tablet Take 50-100 mg by mouth at bedtime.    Results for orders placed or performed in visit on 10/23/19 (from the past 72 hour(s))  CBC     Status: None   Collection Time: 10/23/19  1:34 PM  Result Value Ref Range   WBC 8.8 3.8 - 10.8 Thousand/uL   RBC 4.99 3.80 - 5.10 Million/uL   Hemoglobin 14.0 11.7 - 15.5 g/dL   HCT 42.9 35.0 - 45.0 %   MCV 86.0 80.0 - 100.0 fL   MCH 28.1 27.0 -  33.0 pg   MCHC 32.6 32.0 - 36.0 g/dL   RDW 38.8 82.8 - 00.3 %   Platelets 343 140 - 400 Thousand/uL   MPV 9.5 7.5 - 12.5 fL  COMPLETE METABOLIC PANEL WITH GFR     Status: None   Collection Time: 10/23/19  1:34 PM  Result Value Ref Range   Glucose, Bld 91 65 - 99 mg/dL    Comment: .            Fasting reference interval .    BUN 10 7 - 25 mg/dL   Creat 4.91 7.91 - 5.05 mg/dL   GFR, Est Non African American 102 > OR = 60 mL/min/1.58m2   GFR, Est African American 118 > OR = 60 mL/min/1.22m2   BUN/Creatinine Ratio NOT APPLICABLE 6 - 22 (calc)   Sodium 137 135 - 146 mmol/L   Potassium 4.7 3.5 - 5.3 mmol/L   Chloride 100 98 - 110 mmol/L   CO2 28 20 - 32 mmol/L   Calcium 10.2 8.6 - 10.2 mg/dL   Total Protein 7.9 6.1 - 8.1 g/dL   Albumin 4.8 3.6 - 5.1 g/dL   Globulin 3.1 1.9 - 3.7 g/dL (calc)   AG Ratio 1.5 1.0 - 2.5 (calc)   Total Bilirubin 0.4 0.2 - 1.2 mg/dL   Alkaline phosphatase (APISO) 64 31 - 125 U/L   AST 14 10 - 30 U/L   ALT 14 6 - 29 U/L  TSH     Status: None   Collection Time: 10/23/19  1:34 PM  Result Value Ref Range   TSH 1.61 mIU/L    Comment:           Reference Range .           > or = 20 Years  0.40-4.50 .                Pregnancy Ranges           First trimester    0.26-2.66           Second trimester   0.55-2.73            Third trimester    0.43-2.91   T4, free     Status: None   Collection Time: 10/23/19  1:34 PM  Result Value Ref Range   Free T4 1.0 0.8 - 1.8 ng/dL  Vitamin W97     Status: None   Collection Time: 10/23/19  1:34 PM  Result Value Ref Range   Vitamin B-12 482 200 - 1,100 pg/mL    No results found.  Depression screen PHQ 2/9 10/23/2019  Decreased Interest 2  Down, Depressed, Hopeless 2  PHQ - 2 Score 4  Altered sleeping 3  Tired, decreased energy 3  Change in appetite 3  Feeling bad or failure about yourself  2  Trouble concentrating 3  Moving slowly or fidgety/restless 2  Suicidal thoughts 0  PHQ-9 Score 20  Difficult doing work/chores Very difficult    GAD 7 : Generalized Anxiety Score 10/23/2019  Nervous, Anxious, on Edge 2  Control/stop worrying 1  Worry too much - different things 1  Trouble relaxing 2  Restless 3  Easily annoyed or irritable 2  Afraid - awful might happen 0  Total GAD 7 Score 11  Anxiety Difficulty Somewhat difficult      All questions at time of visit were answered - patient instructed to contact office with any additional concerns or updates.  ER/RTC precautions  were reviewed with the patient.  Please note: voice recognition software was used to produce this document, and typos may escape review. Please contact Dr. Lyn Hollingshead for any needed clarifications.   Total encounter time: 45 minutes.

## 2019-10-23 NOTE — Patient Instructions (Signed)
Plan:  Orders Placed This Encounter  Procedures  . CBC  . COMPLETE METABOLIC PANEL WITH GFR  . Lipid panel  . TSH  . T4, free  . VITAMIN D 25 Hydroxy (Vit-D Deficiency, Fractures)  . Vitamin B12  . Ambulatory referral to Genetics  . Ambulatory referral to Allergy  . Ambulatory referral to Behavioral Health   Meds ordered this encounter  Medications  . umeclidinium bromide (INCRUSE ELLIPTA) 62.5 MCG/INH AEPB    Sig: Inhale 1 puff into the lungs daily.    Dispense:  30 each    Refill:  11    If not covered please suggest alternative covered ICS, thanks  . Olopatadine HCl 0.2 % SOLN    Sig: Apply 1 drop to eye daily.    Dispense:  2.5 mL    Refill:  11

## 2019-10-24 LAB — CBC
HCT: 42.9 % (ref 35.0–45.0)
Hemoglobin: 14 g/dL (ref 11.7–15.5)
MCH: 28.1 pg (ref 27.0–33.0)
MCHC: 32.6 g/dL (ref 32.0–36.0)
MCV: 86 fL (ref 80.0–100.0)
MPV: 9.5 fL (ref 7.5–12.5)
Platelets: 343 10*3/uL (ref 140–400)
RBC: 4.99 10*6/uL (ref 3.80–5.10)
RDW: 11.7 % (ref 11.0–15.0)
WBC: 8.8 10*3/uL (ref 3.8–10.8)

## 2019-10-24 LAB — COMPLETE METABOLIC PANEL WITH GFR
AG Ratio: 1.5 (calc) (ref 1.0–2.5)
ALT: 14 U/L (ref 6–29)
AST: 14 U/L (ref 10–30)
Albumin: 4.8 g/dL (ref 3.6–5.1)
Alkaline phosphatase (APISO): 64 U/L (ref 31–125)
BUN: 10 mg/dL (ref 7–25)
CO2: 28 mmol/L (ref 20–32)
Calcium: 10.2 mg/dL (ref 8.6–10.2)
Chloride: 100 mmol/L (ref 98–110)
Creat: 0.78 mg/dL (ref 0.50–1.10)
GFR, Est African American: 118 mL/min/{1.73_m2} (ref >=60)
GFR, Est Non African American: 102 mL/min/{1.73_m2} (ref >=60)
Globulin: 3.1 g/dL (calc) (ref 1.9–3.7)
Glucose, Bld: 91 mg/dL (ref 65–99)
Potassium: 4.7 mmol/L (ref 3.5–5.3)
Sodium: 137 mmol/L (ref 135–146)
Total Bilirubin: 0.4 mg/dL (ref 0.2–1.2)
Total Protein: 7.9 g/dL (ref 6.1–8.1)

## 2019-10-24 LAB — VITAMIN B12: Vitamin B-12: 482 pg/mL (ref 200–1100)

## 2019-10-24 LAB — T4, FREE: Free T4: 1 ng/dL (ref 0.8–1.8)

## 2019-10-24 LAB — TSH: TSH: 1.61 mIU/L

## 2019-11-06 ENCOUNTER — Telehealth: Payer: Self-pay | Admitting: Licensed Clinical Social Worker

## 2019-11-06 NOTE — Telephone Encounter (Signed)
Received a genetic counseling referral from Dr. Lyn Hollingshead at [facility] at [facility] for fhx of cancer. Ms. Barbara Sellers has been cld and scheduled to see Brianna on 5/24 at 2pm. Pt aware to arrive 15 minutes early.

## 2019-11-19 ENCOUNTER — Inpatient Hospital Stay: Payer: BC Managed Care – PPO | Attending: Genetic Counselor | Admitting: Licensed Clinical Social Worker

## 2019-11-19 ENCOUNTER — Other Ambulatory Visit: Payer: Self-pay

## 2019-11-19 ENCOUNTER — Encounter: Payer: Self-pay | Admitting: Licensed Clinical Social Worker

## 2019-11-19 ENCOUNTER — Inpatient Hospital Stay: Payer: BC Managed Care – PPO

## 2019-11-19 DIAGNOSIS — M9902 Segmental and somatic dysfunction of thoracic region: Secondary | ICD-10-CM | POA: Diagnosis not present

## 2019-11-19 DIAGNOSIS — Z803 Family history of malignant neoplasm of breast: Secondary | ICD-10-CM | POA: Insufficient documentation

## 2019-11-19 DIAGNOSIS — Z8042 Family history of malignant neoplasm of prostate: Secondary | ICD-10-CM

## 2019-11-19 DIAGNOSIS — Z808 Family history of malignant neoplasm of other organs or systems: Secondary | ICD-10-CM | POA: Diagnosis not present

## 2019-11-19 DIAGNOSIS — M9903 Segmental and somatic dysfunction of lumbar region: Secondary | ICD-10-CM | POA: Diagnosis not present

## 2019-11-19 DIAGNOSIS — Z800 Family history of malignant neoplasm of digestive organs: Secondary | ICD-10-CM | POA: Diagnosis not present

## 2019-11-19 DIAGNOSIS — M62838 Other muscle spasm: Secondary | ICD-10-CM | POA: Diagnosis not present

## 2019-11-19 DIAGNOSIS — M9901 Segmental and somatic dysfunction of cervical region: Secondary | ICD-10-CM | POA: Diagnosis not present

## 2019-11-19 DIAGNOSIS — M5386 Other specified dorsopathies, lumbar region: Secondary | ICD-10-CM | POA: Diagnosis not present

## 2019-11-19 NOTE — Progress Notes (Signed)
REFERRING PROVIDER: Emeterio Sellers, [address] [place],  [place] [address]  PRIMARY PROVIDER:  Emeterio Reeve, DO  PRIMARY REASON FOR VISIT:  1. Family history of melanoma   2. Family history of breast cancer   3. Family history of pancreatic cancer   4. Family history of prostate cancer      HISTORY OF PRESENT ILLNESS:   Barbara Sellers, a 29 y.o. female, was seen for a  cancer genetics consultation at the request of Dr. Sheppard Sellers due to a family history of cancer.  Barbara Sellers presents to clinic today to discuss the possibility of a hereditary predisposition to cancer, genetic testing, and to further clarify her future cancer risks, as well as potential cancer risks for family members.    Barbara Sellers is a 29 y.o. female with no personal history of cancer.    CANCER HISTORY:  Oncology History   No history exists.     RISK FACTORS:  Menarche was at age 21.  First live birth at age no.  OCP use for approximately 18 years.  Ovaries intact: yes.  Hysterectomy: no.  Menopausal status: premenopausal.  HRT use: 0 years. Colonoscopy: yes; normal. Mammogram within the last year: no. Number of breast biopsies: 0. Up to date with pelvic exams: yes. Any excessive radiation exposure in the past: no  Past Medical History:  Diagnosis Date  . ADHD (attention deficit hyperactivity disorder)   . Asthma   . Depression   . Family history of breast cancer   . Family history of melanoma   . Family history of pancreatic cancer   . Family history of prostate cancer   . Migraines   . Ocular migraine 10/23/2019  . PTSD (post-traumatic stress disorder)   . Rheumatoid arthritis ([place])   . Seasonal allergies   . Swine flu 2009  . Vertigo     Past Surgical History:  Procedure Laterality Date  . APPENDECTOMY  1996  . ORIF DISTAL RADIUS FRACTURE    . WISDOM TOOTH EXTRACTION      Social History   Socioeconomic History  . Marital status: Married    Spouse  name: Not on file  . Number of children: Not on file  . Years of education: Not on file  . Highest education level: Not on file  Occupational History  . Occupation: Ship broker  . Occupation: POLICE OFFICER    Employer: [employer]  Tobacco Use  . Smoking status: Former Smoker    Types: Cigarettes  . Smokeless tobacco: Never Used  Substance and Sexual Activity  . Alcohol use: Yes    Alcohol/week: 2.0 standard drinks    Types: 2 Standard drinks or equivalent per week    Comment: rare  . Drug use: Never  . Sexual activity: Yes    Partners: Female  Other Topics Concern  . Not on file  Social History Narrative   She exercises at the gym and does kickboxing and soccer.   Social Determinants of Health   Financial Resource Strain:   . Difficulty of Paying Living Expenses:   Food Insecurity:   . Worried About Charity fundraiser in the Last Year:   . Arboriculturist in the Last Year:   Transportation Needs:   . Film/video editor (Medical):   Marland Kitchen Lack of Transportation (Non-Medical):   Physical Activity:   . Days of Exercise per Week:   . Minutes of Exercise per Session:   Stress:   .  Feeling of Stress :   Social Connections:   . Frequency of Communication with Friends and Family:   . Frequency of Social Gatherings with Friends and Family:   . Attends Religious Services:   . Active Member of Clubs or Organizations:   . Attends Archivist Meetings:   Marland Kitchen Marital Status:      FAMILY HISTORY:  We obtained a detailed, 4-generation family history.  Significant diagnoses are listed below: Family History  Problem Relation Age of Onset  . Macular degeneration Mother   . Colon polyps Father   . Diabetes Father   . Diverticulitis Father   . Cancer Father   . Cancer - Other Father        Adenocarcinoma  . Arrhythmia Brother        half-brother  . Diabetes Maternal Aunt   . Cancer Maternal Grandmother        SKIN  . Cancer Maternal Grandfather        SKIN  .  Hypertension Paternal Grandmother   . Skin cancer Paternal Grandmother   . Hypertension Paternal Grandfather   . Cancer Paternal Grandfather        melanoma, kidney, bladder, prostate, lung, liver cancer  . Skin cancer Paternal Grandfather   . Melanoma Paternal Aunt   . Pancreatic cancer Other   . Breast cancer Maternal Aunt 57  . Pancreatic cancer Maternal Aunt   . Melanoma Paternal Aunt   . Melanoma Other    Barbara Sellers does not have children. She has 2 maternal half brothers.   Barbara Sellers mother is 66 and has not had cancer. Patient had 4 maternal aunts. One recently died due to metastatic breast cancer at 10. Another aunt had pancreatic cancer and died at 61. Maternal cousins have had melanoma and possibly other cancers. Maternal grandparents both had skin cancers, grandmother died at 21, grandfather died in his 44s. Grandfather had 2 brothers who had pancreatic cancer.  Barbara Sellers father was diagnosed with stage IV cancer originating from the neck. Patient has 3 paternal aunts (one is father's twin) and 1 paternal uncle. Two aunts have had melanoma. Her paternal cousins have also had melanoma. Grandfather had melanoma in his 28s, and then was diagnosed with several cancers: lung, liver, kidney, prostate and bladder. Patient reports these were all separate cancers. Paternal grandmother is living in her 51s.  Barbara Sellers is unaware of previous family history of genetic testing for hereditary cancer risks. Patient's maternal ancestors are of Irish/German/[place] descent, and paternal ancestors are of English/German/Irish descent. There is no reported Ashkenazi Jewish ancestry. There is no known consanguinity.  GENETIC COUNSELING ASSESSMENT: Barbara Sellers is a 29 y.o. female with a family history of cancer which is somewhat suggestive of a hereditary cancer syndrome and predisposition to cancer. We, therefore, discussed and recommended the following at today's visit.   DISCUSSION: We  discussed that 5 - 10% of breast/pancreatic cancer is hereditary, with most cases associated with BRCA1/BRCA2 mutations.  There are other genes that can be associated with hereditary  cancer syndromes. Melanoma can also be seen with BRCA2 mutations, as well as in other syndromes.We discussed that testing is beneficial for several reasons including knowing how to follow individuals for cancer screenings and understand if other family members could be at risk for cancer and allow them to undergo genetic testing.   We reviewed the characteristics, features and inheritance patterns of hereditary cancer syndromes. We also discussed genetic testing, including the appropriate family  members to test, the process of testing, insurance coverage and turn-around-time for results. We discussed the implications of a negative, positive and/or variant of uncertain significant result. We recommended Barbara Sellers pursue genetic testing for the [facility] Common Hereditary Cancers Panel gene panel.   The Common Hereditary Cancers Panel offered by [facility] includes sequencing and/or deletion duplication testing of the following 48 genes: APC, ATM, AXIN2, BARD1, BMPR1A, BRCA1, BRCA2, BRIP1, CDH1, CDKN2A (p14ARF), CDKN2A (p16INK4a), CKD4, CHEK2, CTNNA1, DICER1, EPCAM (Deletion/duplication testing only), GREM1 (promoter region deletion/duplication testing only), KIT, MEN1, MLH1, MSH2, MSH3, MSH6, MUTYH, NBN, NF1, NHTL1, PALB2, PDGFRA, PMS2, POLD1, POLE, PTEN, RAD50, RAD51C, RAD51D, RNF43, SDHB, SDHC, SDHD, SMAD4, SMARCA4. STK11, TP53, TSC1, TSC2, and VHL.  The following genes were evaluated for sequence changes only: SDHA and HOXB13 c.251G>A variant only.  Based on Barbara Sellers family history of cancer, she meets medical criteria for genetic testing. Despite that she meets criteria, she may still have an out of pocket cost. We discussed that if her out of pocket cost for testing is over $100, the laboratory will call and confirm whether she  wants to proceed with testing.  If the out of pocket cost of testing is less than $100 she will be billed by the genetic testing laboratory.   We discussed that some people do not want to undergo genetic testing due to fear of genetic discrimination.  A federal law called the Genetic Information Non-Discrimination Act (GINA) of 2008 helps protect individuals against genetic discrimination based on their genetic test results.  It impacts both health insurance and employment.  For health insurance, it protects against increased premiums, being kicked off insurance or being forced to take a test in order to be insured.  For employment it protects against hiring, firing and promoting decisions based on genetic test results.  Health status due to a cancer diagnosis is not protected under GINA.  This law does not protect life insurance, disability insurance, or other types of insurance.   PLAN: After considering the risks, benefits, and limitations, Barbara Sellers provided informed consent to pursue genetic testing and the blood sample was sent to [facility] for analysis of the Common Hereditary Cancers Panel. Results should be available within approximately 2-3 weeks' time, at which point they will be disclosed by telephone to Barbara Sellers, as will any additional recommendations warranted by these results. Barbara Sellers will receive a summary of her genetic counseling visit and a copy of her results once available. This information will also be available in Epic.    Barbara Sellers questions were answered to her satisfaction today. Our contact information was provided should additional questions or concerns arise. Thank you for the referral and allowing [place] to share in the care of your patient.   Faith Rogue, MS, [facility] Genetic Counselor [email or web address] Phone: [phone]  The patient was seen for a total of 30 minutes in face-to-face genetic counseling.  Drs. Magrinat, Lindi Adie and/or Burr Medico were  available for discussion regarding this case.   _______________________________________________________________________ For Office Staff:  Number of people involved in session: 1 Was an Intern/ student involved with case: no

## 2019-11-23 DIAGNOSIS — R12 Heartburn: Secondary | ICD-10-CM | POA: Diagnosis not present

## 2019-11-23 DIAGNOSIS — Z0000 Encounter for general adult medical examination without abnormal findings: Secondary | ICD-10-CM | POA: Diagnosis not present

## 2019-11-23 DIAGNOSIS — Z79899 Other long term (current) drug therapy: Secondary | ICD-10-CM | POA: Diagnosis not present

## 2019-11-23 DIAGNOSIS — Z13220 Encounter for screening for lipoid disorders: Secondary | ICD-10-CM | POA: Diagnosis not present

## 2019-11-28 ENCOUNTER — Telehealth: Payer: Self-pay | Admitting: Licensed Clinical Social Worker

## 2019-11-28 NOTE — Progress Notes (Signed)
New Patient Note  RE: Barbara Sellers MRN: [id number] DOB: 1991/04/21 Date of Office Visit: 11/29/2019  Referring provider: Sunnie Nielsen, DO Primary care provider: Sunnie Nielsen, DO  Chief Complaint: Allergic Rhinitis  (uncontroled, over the counter isn't working. She use to get allergy shots as a child and that seems to be the only thing to work. ) and Asthma (is rough right now due to the allergies)  History of Present Illness: I had the pleasure of seeing Barbara Sellers for initial evaluation at the [facility] on 11/29/2019. She is a 29 y.o. female, who is referred here by Sunnie Nielsen, DO for the evaluation of asthma and allergic rhinitis. Up to date with COVID-19 vaccine: yes  Rhinitis: She reports symptoms of itchy throat, itchy/watery eyes, facial swelling, rhinorrhea, nasal congestion, coughing, sneezing. Symptoms have been going on for 20+ years. The symptoms are present all year around with worsening in spring and summer. Other triggers include exposure to cats. Anosmia: diminished sense of smell due to COVID. Headache: yes. She has used Careers adviser, zyrtec, Claritin, Singulair, azelastine, Flonase, systane with minimal improvement in symptoms. Sinus infections: 2 per year. Previous work up includes: 2015 skin testing in the past was positive to multiple pollens, cat, mold per patient report.  Patient was on allergy injections from [phone] which helped. No systemic reactions.  Previous ENT evaluation: 5 years ago, no prior sinus surgery. Previous sinus imaging: no. History of nasal polyps: no. Last eye exam: 2 years ago. History of reflux: takes Protonix 64m daily for 6 years.  Asthma:  She reports symptoms of chest tightness, shortness of breath, coughing, wheezing, nocturnal awakenings for 30 years. Current medications include albuterol prn and Incruse daily which help. She reports using aerochamber with inhalers. She tried the following inhalers:  Advair, proair. Main triggers are allergies, exercise, pet exposure. In the last month, frequency of symptoms: daily. Frequency of nocturnal symptoms: nightly. Frequency of SABA use: daily. Interference with physical activity: yes. Sleep is disturbed. In the last 12 months, emergency room visits/urgent care visits/doctor office visits or hospitalizations due to respiratory issues: 2 due to COVID. In the last 12 months, oral steroids courses: once with no benefit. Lifetime history of hospitalization for respiratory issues: once as a child. Prior intubations: not sure. History of pneumonia: once a year. She was evaluated by allergist in the past. Smoking exposure: no. Up to date with flu vaccine: yes.   07/11/2019 CXR "Small focus of airspace disease in the right lung base. The lungs are otherwise unremarkable. The cardiomediastinal silhouette is normal in size and contour."  Normal EKG.   Assessment and Plan: Barbara Sellers is a 29 y.o. female with: Other allergic rhinitis Perennial rhino conjunctivitis symptoms for 20+ years with worsening in the spring and summer. Other triggers include cats. Tried allegra, zyrtec, Claritin, Singulair, azelastine, Flonase and OTC eye drops with minimal benefit. 2015 skin testing in the past was positive to multiple pollens, cat, mold per patient report. On allergy injections in the 1990s with good benefit. No systemic reactions. On Protonix  for reflux.   Today's skin testing showed: Positive to grass, ragweed. Borderline to mold and cat. Results given.   Start environmental control measures as below.  May use over the counter antihistamines such as Xyzal (levocetirizine) daily as needed - samples given.   May use azelastine 1-2 spray per nostril twice a day as needed drainage.   Will stay away from steroid nasal sprays for now due to frequent  epistaxis.   Nasal saline spray (i.e., Simply Saline) or nasal saline lavage (i.e., NeilMed) is recommended as  needed.  Had a detailed discussion with patient/family that clinical history is suggestive of allergic rhinitis, and may benefit from allergy immunotherapy (AIT). Discussed in detail regarding the dosing, schedule, side effects (mild to moderate local allergic reaction and rarely systemic allergic reactions including anaphylaxis), and benefits (significant improvement in nasal symptoms, seasonal flares of asthma) of immunotherapy with the patient. There is significant time commitment involved with allergy shots, which includes weekly immunotherapy injections for first 9-12 months and then biweekly to monthly injections for 3-5 years. Consent signed.  Start allergy injections.   Allergic conjunctivitis of both eyes Wears contacts and has issues with dry eyes.  Discussed with patient that most allergy eye drops cause worsening dryness.  May use refresh eye drops as needed.  See assessment and plan as above for allergic rhinitis.   Reactive airway disease Diagnosed with asthma as a child and was doing well up until recently. She had COVID-19 in January 2021. Recently started on Incruse and using albuterol less but was using it multiple times per day. Prednisone did not help. CXR on 07/11/2019 - Small focus of airspace disease in the right lung base.  Today's spirometry was normal with no improvement in FEV1 post bronchodilator treatment. Clinically feeling the same.   Discussed with patient that it is difficult to decipher whether it is her allergies flaring her breathing symptoms and/or possibly her past COVID-19 infection causing some inflammation as well.  . Stop Incruse for now.  . Daily controller medication(s): start Airduo 15113mcg/14mcg Respiclick 1 puff twice a day and rinse mouth afterwards. Sample and coupon given. . Prior to physical activity: May use albuterol rescue inhaler 2 puffs 5 to 15 minutes prior to strenuous physical activities. Marland Kitchen. Rescue medications: May use albuterol rescue  inhaler 2 puffs or nebulizer every 4 to 6 hours as needed for shortness of breath, chest tightness, coughing, and wheezing. Monitor frequency of use.  . Repeat spirometry at next visit.   History of frequent upper respiratory infection History of frequent upper and lower respiratory infections including bronchitis/pneumonia once a year. No previous immune evaluation.  Keep track of infections.  Check basic immune bloodwork.  Adverse food reaction Anaphylactic reaction to cayenne pepper in the past. Tolerates other peppers including jalapenos with no issues. Last testing done in 2015.  No skin prick testing available for cayenne pepper in our office. Will get bloodwork instead.   Continue strict avoidance of cayenne pepper.  For mild symptoms you can take over the counter antihistamines such as Benadryl and monitor symptoms closely. If symptoms worsen or if you have severe symptoms including breathing issues, throat closure, significant swelling, whole body hives, severe diarrhea and vomiting, lightheadedness then inject epinephrine and seek immediate medical care afterwards.  Emergency action plan given.  Drug reaction Reactions to azithromycin and doxycycline in the past.  Continue to avoid.   Return in about 3 months (around 02/29/2020).  Meds ordered this encounter  Medications  . Fluticasone-Salmeterol,sensor, (AIRDUO DIGIHALER) 113-14 MCG/ACT AEPB    Sig: Inhale 1 puff into the lungs 2 (two) times daily. Rinse mouth after each use    Dispense:  1 each    Refill:  5    Patient is coming with coupon  . Azelastine HCl 0.15 % SOLN    Sig: Place 1-2 sprays into both nostrils 2 (two) times daily as needed (for drainage).  Dispense:  30 mL    Refill:  5    Lab Orders     Allergen,Chili Pepper,[id number]     CBC with Differential/Platelet     Strep pneumoniae 23 Serotypes IgG     IgG, IgA, IgM     Diphtheria / Tetanus Antibody Panel     Complement, total  Other allergy  screening: Food allergy: yes  Cayenne pepper - anaphylaxis Tolerates other forms of peppers and jalapenos with no issues.  Patient has epinephrine on hand.  Last testing was done in 2015.   Medication allergy: yes  Azithromycin - rash Doxycycline - swelling  Hymenoptera allergy: no Urticaria: no Eczema: yes sometimes in the winter.   Diagnostics: Spirometry:  Tracings reviewed. Her effort: Good reproducible efforts. FVC: 2.97L FEV1: 2.76L, 93% predicted FEV1/FVC ratio: 93% Interpretation: Spirometry consistent with normal pattern with no improvement in FEV1 post bronchodilator treatment. Clinically feeling the same.  Please see scanned spirometry results for details.  Skin Testing: Environmental allergy panel. Positive to grass, ragweed. Borderline to mold and cat. Results discussed with patient/family. Airborne Adult Perc - 11/29/19 0929    Time Antigen Placed  6578    Allergen Manufacturer  Waynette Buttery    Location  Back    Number of Test  59    Panel 1  Select    1. Control-Buffer 50% Glycerol  Negative    2. Control-Histamine 1 mg/ml  2+    3. Albumin saline  Negative    4. Bahia  Negative    5. French Southern Territ[place]  Negative    6. Johnson  Negative    7. Kentucky Blue  2+    8. Meadow Fescue  Negative    9. Perennial Rye  Negative    10. Sweet Vernal  --   +/-   11. Timothy  3+    12. Cocklebur  Negative    13. Burweed Marshelder  Negative    14. Ragweed, short  Negative    15. Ragweed, Giant  Negative    16. Plantain,  English  Negative    17. Lamb's Quarters  Negative    18. Sheep Sorrell  Negative    19. Rough Pigweed  Negative    20. Marsh Elder, Rough  Negative    21. Mugwort, Common  Negative    22. Ash mix  Negative    23. Birch mix  Negative    24. Beech American  Negative    25. Box, Elder  Negative    26. Cedar, red  Negative    27. Cottonwood, Guinea-Bissau  Negative    28. Elm mix  Negative    29. Hickory  Negative    30. Maple mix  Negative    31. Oak, Guinea-Bissau  mix  Negative    32. Pecan Pollen  Negative    33. Pine mix  Negative    34. Sycamore Eastern  Negative    35. Walnut, Black Pollen  Negative    36. Alternaria alternata  Negative    37. Cladosporium Herbarum  Negative    38. Aspergillus mix  Negative    39. Penicillium mix  Negative    40. Bipolaris sorokiniana (Helminthosporium)  Negative    41. Drechslera spicifera (Curvularia)  Negative    42. Mucor plumbeus  Negative    43. Fusarium moniliforme  Negative    44. Aureobasidium pullulans (pullulara)  Negative    45. Rhizopus oryzae  Negative    46. Botrytis cinera  Negative    47. Epicoccum nigrum  Negative    48. Phoma betae  Negative    49. Candida Albicans  Negative    50. Trichophyton mentagrophytes  Negative    51. Mite, D Farinae  5,000 AU/ml  Negative    52. Mite, D Pteronyssinus  5,000 AU/ml  Negative    53. Cat Hair 10,000 BAU/ml  Negative    54.  Dog Epithelia  Negative    55. Mixed Feathers  Negative    56. Horse Epithelia  Negative    57. Cockroach, German  Negative    58. Mouse  Negative    59. Tobacco Leaf  Negative     Intradermal - 11/29/19 0957    Time Antigen Placed  4765    Allergen Manufacturer  Waynette Buttery    Location  Arm    Number of Test  14    Intradermal  Select    Control  Negative    French Southern Territ[place]  Negative    Johnson  Negative    Ragweed mix  2+    Weed mix  Negative    Tree mix  Negative    Mold 1  --   +/-   Mold 2  Negative    Mold 3  Negative    Mold 4  --   +/-   Cat  --   +\-   Dog  Negative    Cockroach  Negative    Mite mix  Negative       Past Medical History: Patient Active Problem List   Diagnosis Date Noted  . Reactive airway disease 11/29/2019  . Adverse food reaction 11/29/2019  . Other allergic rhinitis 11/29/2019  . History of frequent upper respiratory infection 11/29/2019  . Allergic conjunctivitis of both eyes 11/29/2019  . Drug reaction 11/29/2019  . Family history of melanoma   . Family history of breast cancer    . Family history of pancreatic cancer   . Family history of prostate cancer   . Ocular migraine 10/23/2019  . Cubital tunnel syndrome, left 07/19/2018  . S/P hardware removal 10/09/2015  . Fracture of metacarpal base of left hand, closed 11/15/2014  . Closed fracture of distal end of left radius 11/11/2014   Past Medical History:  Diagnosis Date  . ADHD (attention deficit hyperactivity disorder)   . Asthma   . Depression   . Eczema   . Family history of breast cancer   . Family history of melanoma   . Family history of pancreatic cancer   . Family history of prostate cancer   . Migraines   . Ocular migraine 10/23/2019  . PTSD (post-traumatic stress disorder)   . Recurrent upper respiratory infection (URI)   . Rheumatoid arthritis (HCC)   . Seasonal allergies   . Swine flu 2009  . Urticaria   . Vertigo    Past Surgical History: Past Surgical History:  Procedure Laterality Date  . APPENDECTOMY  1996  . ORIF DISTAL RADIUS FRACTURE    . WISDOM TOOTH EXTRACTION     Medication List:  Current Outpatient Medications  Medication Sig Dispense Refill  . albuterol (PROVENTIL HFA;VENTOLIN HFA) 108 (90 Base) MCG/ACT inhaler Inhale into the lungs every 6 (six) hours as needed for wheezing or shortness of breath.    . Drospirenone-Ethinyl Estradiol (NIKKI PO) Take by mouth.    . EPINEPHrine 0.1 MG/0.1ML SOAJ Inject as directed.    . Multiple Vitamins-Minerals (MULTIVITAMIN ADULTS PO) Take by mouth.    Marland Kitchen  pantoprazole (PROTONIX) 40 MG tablet Take 40 mg by mouth daily.    . prazosin (MINIPRESS) 1 MG capsule Take by mouth at bedtime as needed.    . sertraline (ZOLOFT) 100 MG tablet Take 100 mg by mouth daily.    . traZODone (DESYREL) 50 MG tablet Take 50-100 mg by mouth at bedtime.    Marland Kitchen umeclidinium bromide (INCRUSE ELLIPTA) 62.5 MCG/INH AEPB Inhale 1 puff into the lungs daily. 30 each 11  . Azelastine HCl 0.15 % SOLN Place 1-2 sprays into both nostrils 2 (two) times daily as needed (for  drainage). 30 mL 5  . Fluticasone-Salmeterol,sensor, (AIRDUO DIGIHALER) 113-14 MCG/ACT AEPB Inhale 1 puff into the lungs 2 (two) times daily. Rinse mouth after each use 1 each 5   No current facility-administered medications for this visit.   Allergies: Allergies  Allergen Reactions  . Cayenne Anaphylaxis  . Azithromycin Itching and Rash    Also burning.    . Doxycycline Other (See Comments) and Swelling    Esophagus issues. "felt like razor blades" Throat swelling   . Buspar [Buspirone] Anxiety    Rx caused muscle spasms   Social History: Social History   Socioeconomic History  . Marital status: Married    Spouse name: Not on file  . Number of children: Not on file  . Years of education: Not on file  . Highest education level: Not on file  Occupational History  . Occupation: Ship broker  . Occupation: POLICE OFFICER    Employer: [employer]  Tobacco Use  . Smoking status: Former Smoker    Types: Cigarettes  . Smokeless tobacco: Never Used  Substance and Sexual Activity  . Alcohol use: Yes    Alcohol/week: 2.0 standard drinks    Types: 2 Standard drinks or equivalent per week    Comment: rare  . Drug use: Never  . Sexual activity: Yes    Partners: Female  Other Topics Concern  . Not on file  Social History Narrative   She exercises at the gym and does kickboxing and soccer.   Social Determinants of Health   Financial Resource Strain:   . Difficulty of Paying Living Expenses:   Food Insecurity:   . Worried About Charity fundraiser in the Last Year:   . Arboriculturist in the Last Year:   Transportation Needs:   . Film/video editor (Medical):   Marland Kitchen Lack of Transportation (Non-Medical):   Physical Activity:   . Days of Exercise per Week:   . Minutes of Exercise per Session:   Stress:   . Feeling of Stress :   Social Connections:   . Frequency of Communication with Friends and Family:   . Frequency of Social Gatherings with Friends and Family:   .  Attends Religious Services:   . Active Member of Clubs or Organizations:   . Attends Archivist Meetings:   Marland Kitchen Marital Status:    Lives in a 31 year old home. Smoking: denies Occupation: in Public relations account executive  Environmental History: Water Damage/mildew in the house: no Carpet in the family room: yes Carpet in the bedroom: yes Heating: electric Cooling: central Pet: yes 2 dogs x 10 yrs  Family History: Family History  Problem Relation Age of Onset  . Macular degeneration Mother   . Urticaria Mother   . Colon polyps Father   . Diabetes Father   . Diverticulitis Father   . Cancer Father   . Cancer - Other Father  Adenocarcinoma  . Allergic rhinitis Father   . Asthma Father   . Eczema Father   . Allergic rhinitis Brother   . Arrhythmia Brother        half-brother  . Allergic rhinitis Brother   . Diabetes Maternal Aunt   . Allergic rhinitis Maternal Aunt   . Cancer Maternal Grandmother        SKIN  . Cancer Maternal Grandfather        SKIN  . Hypertension Paternal Grandmother   . Skin cancer Paternal Grandmother   . Allergic rhinitis Paternal Grandmother   . Asthma Paternal Grandmother   . Hypertension Paternal Grandfather   . Cancer Paternal Grandfather        melanoma, kidney, bladder, prostate, lung, liver cancer  . Skin cancer Paternal Grandfather   . Melanoma Paternal Aunt   . Allergic rhinitis Paternal Aunt   . Asthma Paternal Aunt   . Pancreatic cancer Other   . Breast cancer Maternal Aunt 59  . Allergic rhinitis Maternal Aunt   . Pancreatic cancer Maternal Aunt   . Allergic rhinitis Maternal Aunt   . Allergic rhinitis Maternal Aunt   . Asthma Maternal Aunt   . Melanoma Paternal Aunt   . Allergic rhinitis Paternal Aunt   . Asthma Paternal Aunt   . Melanoma Other    Review of Systems  Constitutional: Negative for appetite change, chills, fever and unexpected weight change.  HENT: Positive for congestion, rhinorrhea and  sneezing.   Eyes: Positive for itching.  Respiratory: Positive for cough, chest tightness, shortness of breath and wheezing.   Cardiovascular: Negative for chest pain.  Gastrointestinal: Negative for abdominal pain.  Genitourinary: Negative for difficulty urinating.  Skin: Negative for rash.  Allergic/Immunologic: Positive for environmental allergies and food allergies.  Neurological: Positive for headaches.   Objective: BP 128/78 (BP Location: Right Arm, Patient Position: Sitting, Cuff Size: Normal)   Pulse 76   Temp 98.5 F (36.9 C) (Temporal)   Resp 16   Ht 5' 2.5" (1.588 m)   Wt 150 lb (68 kg)   SpO2 96%   BMI 27.00 kg/m  Body mass index is 27 kg/m. Physical Exam  Constitutional: She is oriented to person, place, and time. She appears well-developed and well-nourished.  HENT:  Head: Normocephalic and atraumatic.  Right Ear: External ear normal.  Left Ear: External ear normal.  Nose: Nose normal.  Mouth/Throat: Oropharynx is clear and moist.  Eyes: Conjunctivae and EOM are normal.  Cardiovascular: Normal rate, regular rhythm and normal heart sounds. Exam reveals no gallop and no friction rub.  No murmur heard. Pulmonary/Chest: Effort normal and breath sounds normal. She has no wheezes. She has no rales.  Abdominal: Soft.  Musculoskeletal:     Cervical back: Neck supple.  Neurological: She is alert and oriented to person, place, and time.  Skin: Skin is warm. No rash noted.  Psychiatric: She has a normal mood and affect. Her behavior is normal.  Nursing note and vitals reviewed.  The plan was reviewed with the patient/family, and all questions/concerned were addressed.  It was my pleasure to see Barbara Sellers today and participate in her care. Please feel free to contact me with any questions or concerns.  Sincerely,  Wyline Mood, DO Allergy & Immunology  [facility] office: [phone] [facility] office: [phone] [place]  office: [phone]

## 2019-11-29 ENCOUNTER — Other Ambulatory Visit: Payer: Self-pay

## 2019-11-29 ENCOUNTER — Ambulatory Visit: Payer: BC Managed Care – PPO | Admitting: Allergy

## 2019-11-29 ENCOUNTER — Encounter: Payer: Self-pay | Admitting: Allergy

## 2019-11-29 VITALS — BP 128/78 | HR 76 | Temp 98.5°F | Resp 16 | Ht 62.5 in | Wt 150.0 lb

## 2019-11-29 DIAGNOSIS — T50905A Adverse effect of unspecified drugs, medicaments and biological substances, initial encounter: Secondary | ICD-10-CM | POA: Insufficient documentation

## 2019-11-29 DIAGNOSIS — T781XXA Other adverse food reactions, not elsewhere classified, initial encounter: Secondary | ICD-10-CM | POA: Insufficient documentation

## 2019-11-29 DIAGNOSIS — Z8709 Personal history of other diseases of the respiratory system: Secondary | ICD-10-CM | POA: Diagnosis not present

## 2019-11-29 DIAGNOSIS — T781XXD Other adverse food reactions, not elsewhere classified, subsequent encounter: Secondary | ICD-10-CM

## 2019-11-29 DIAGNOSIS — J4540 Moderate persistent asthma, uncomplicated: Secondary | ICD-10-CM | POA: Diagnosis not present

## 2019-11-29 DIAGNOSIS — J45909 Unspecified asthma, uncomplicated: Secondary | ICD-10-CM | POA: Insufficient documentation

## 2019-11-29 DIAGNOSIS — T50905D Adverse effect of unspecified drugs, medicaments and biological substances, subsequent encounter: Secondary | ICD-10-CM

## 2019-11-29 DIAGNOSIS — H1013 Acute atopic conjunctivitis, bilateral: Secondary | ICD-10-CM

## 2019-11-29 DIAGNOSIS — J3089 Other allergic rhinitis: Secondary | ICD-10-CM

## 2019-11-29 MED ORDER — AIRDUO DIGIHALER 113-14 MCG/ACT IN AEPB: 1.0000 | INHALATION_SPRAY | Freq: Two times a day (BID) | RESPIRATORY_TRACT | 5 refills | Status: DC

## 2019-11-29 MED ORDER — AZELASTINE HCL 0.15 % NA SOLN: 1.0000 | Freq: Two times a day (BID) | NASAL | 5 refills | Status: DC | PRN

## 2019-11-29 NOTE — Assessment & Plan Note (Signed)
Anaphylactic reaction to cayenne pepper in the past. Tolerates other peppers including jalapenos with no issues. Last testing done in 2015.  No skin prick testing available for cayenne pepper in our office. Will get bloodwork instead.   Continue strict avoidance of cayenne pepper.  For mild symptoms you can take over the counter antihistamines such as Benadryl and monitor symptoms closely. If symptoms worsen or if you have severe symptoms including breathing issues, throat closure, significant swelling, whole body hives, severe diarrhea and vomiting, lightheadedness then inject epinephrine and seek immediate medical care afterwards.  Emergency action plan given.

## 2019-11-29 NOTE — Assessment & Plan Note (Signed)
Wears contacts and has issues with dry eyes.  Discussed with patient that most allergy eye drops cause worsening dryness.  May use refresh eye drops as needed.  See assessment and plan as above for allergic rhinitis.

## 2019-11-29 NOTE — Progress Notes (Signed)
VIALS EXP 11-28-20 

## 2019-11-29 NOTE — Assessment & Plan Note (Signed)
Diagnosed with asthma as a child and was doing well up until recently. She had COVID-19 in January 2021. Recently started on Incruse and using albuterol less but was using it multiple times per day. Prednisone did not help. CXR on 07/11/2019 - Small focus of airspace disease in the right lung base.  Today's spirometry was normal with no improvement in FEV1 post bronchodilator treatment. Clinically feeling the same.   Discussed with patient that it is difficult to decipher whether it is her allergies flaring her breathing symptoms and/or possibly her past COVID-19 infection causing some inflammation as well.  . Stop Incruse for now.  . Daily controller medication(s): start Airduo 155mcg/14mcg Respiclick 1 puff twice a day and rinse mouth afterwards. Sample and coupon given. . Prior to physical activity: May use albuterol rescue inhaler 2 puffs 5 to 15 minutes prior to strenuous physical activities. Marland Kitchen Rescue medications: May use albuterol rescue inhaler 2 puffs or nebulizer every 4 to 6 hours as needed for shortness of breath, chest tightness, coughing, and wheezing. Monitor frequency of use.  . Repeat spirometry at next visit.

## 2019-11-29 NOTE — Patient Instructions (Addendum)
Today's skin testing showed: Positive to grass, ragweed. Borderline to mold and cat. Results given.   Environmental allergies  Start environmental control measures as below.  May use over the counter antihistamines such as Xyzal (levocetirizine) daily as needed.  May use azelastine 1-2 spray per nostril twice a day as needed drainage.   Nasal saline spray (i.e., Simply Saline) or nasal saline lavage (i.e., NeilMed) is recommended as needed.  May use refresh eye drops as needed .  Had a detailed discussion with patient/family that clinical history is suggestive of allergic rhinitis, and may benefit from allergy immunotherapy (AIT). Discussed in detail regarding the dosing, schedule, side effects (mild to moderate local allergic reaction and rarely systemic allergic reactions including anaphylaxis), and benefits (significant improvement in nasal symptoms, seasonal flares of asthma) of immunotherapy with the patient. There is significant time commitment involved with allergy shots, which includes weekly immunotherapy injections for first 9-12 months and then biweekly to monthly injections for 3-5 years.   Consent signed.  Start allergy injections.   Asthma: . Stop Incruse for now.  . Daily controller medication(s): start Airduo 180mcg/14mcg Respiclick 1 puff twice a day and rinse mouth afterwards. Sample and coupon given. . Prior to physical activity: May use albuterol rescue inhaler 2 puffs 5 to 15 minutes prior to strenuous physical activities. Marland Kitchen Rescue medications: May use albuterol rescue inhaler 2 puffs or nebulizer every 4 to 6 hours as needed for shortness of breath, chest tightness, coughing, and wheezing. Monitor frequency of use.  . Asthma control goals:  o Full participation in all desired activities (may need albuterol before activity) o Albuterol use two times or less a week on average (not counting use with activity) o Cough interfering with sleep two times or less a  month o Oral steroids no more than once a year o No hospitalizations  Food allergy:  Continue strict avoidance of cayenne pepper.  For mild symptoms you can take over the counter antihistamines such as Benadryl and monitor symptoms closely. If symptoms worsen or if you have severe symptoms including breathing issues, throat closure, significant swelling, whole body hives, severe diarrhea and vomiting, lightheadedness then inject epinephrine and seek immediate medical care afterwards.  Emergency action plan given.  Frequent infections:  Keep track of infections.  Get bloodwork:  We are ordering labs, so please allow 1-2 weeks for the results to come back. With the newly implemented Cures Act, the labs might be visible to you at the same time that they become visible to me. However, I will not address the results until all of the results are back, so please be patient.  In the meantime, continue recommendations in your patient instructions, including avoidance measures (if applicable), until you hear from me.  Nose Bleeds: . Nosebleeds are very common.  Site of the bleeding is typically on the septum or at the very front of the nose.  Some of the more common causes are from trauma, inflammation or medication induced. Marland Kitchen Pinch both nostrils while leaning forward for at least 5 minutes before checking to see if the bleeding has stopped. If bleeding is not controlled within 5-10 minutes apply a cotton ball soaked with oxymetazoline (Afrin) to the bleeding nostril for a few seconds.  Preventative treatment: . Apply saline nasal gel in each nostril twice a day for 2 weeks to allow the nasal mucosa to heal . Consider using a humidifier in the winter . Try to keep your blood pressure as normal as possible (120/80)  Follow up in 3 months or sooner if needed.   Reducing Pollen Exposure . Pollen seasons: trees (spring), grass (summer) and ragweed/weeds (fall). Marland Kitchen Keep windows closed in your home  and car to lower pollen exposure.  Susa Simmonds air conditioning in the bedroom and throughout the house if possible.  . Avoid going out in dry windy days - especially early morning. . Pollen counts are highest between 5 - 10 AM and on dry, hot and windy days.  . Save outside activities for late afternoon or after a heavy rain, when pollen levels are lower.  . Avoid mowing of grass if you have grass pollen allergy. Marland Kitchen Be aware that pollen can also be transported indoors on people and pets.  . Dry your clothes in an automatic dryer rather than hanging them outside where they might collect pollen.  . Rinse hair and eyes before bedtime.  Mold Control . Mold and fungi can grow on a variety of surfaces provided certain temperature and moisture conditions exist.  . Outdoor molds grow on plants, decaying vegetation and soil. The major outdoor mold, Alternaria and Cladosporium, are found in very high numbers during hot and dry conditions. Generally, a late summer - fall peak is seen for common outdoor fungal spores. Rain will temporarily lower outdoor mold spore count, but counts rise rapidly when the rainy period ends. . The most important indoor molds are Aspergillus and Penicillium. Dark, humid and poorly ventilated basements are ideal sites for mold growth. The next most common sites of mold growth are the bathroom and the kitchen. Outdoor (Seasonal) Mold Control . Use air conditioning and keep windows closed. . Avoid exposure to decaying vegetation. Marland Kitchen Avoid leaf raking. . Avoid grain handling. . Consider wearing a face mask if working in moldy areas.  Indoor (Perennial) Mold Control  . Maintain humidity below 50%. . Get rid of mold growth on hard surfaces with water, detergent and, if necessary, 5% bleach (do not mix with other cleaners). Then dry the area completely. If mold covers an area more than 10 square feet, consider hiring an indoor environmental professional. . For clothing, washing with  soap and water is best. If moldy items cannot be cleaned and dried, throw them away. . Remove sources e.g. contaminated carpets. . Repair and seal leaking roofs or pipes. Using dehumidifiers in damp basements may be helpful, but empty the water and clean units regularly to prevent mildew from forming. All rooms, especially basements, bathrooms and kitchens, require ventilation and cleaning to deter mold and mildew growth. Avoid carpeting on concrete or damp floors, and storing items in damp areas. Pet Allergen Avoidance: . Contrary to popular opinion, there are no "hypoallergenic" breeds of dogs or cats. That is because people are not allergic to an animal's hair, but to an allergen found in the animal's saliva, dander (dead skin flakes) or urine. Pet allergy symptoms typically occur within minutes. For some people, symptoms can build up and become most severe 8 to 12 hours after contact with the animal. People with severe allergies can experience reactions in public places if dander has been transported on the pet owners' clothing. Marland Kitchen Keeping an animal outdoors is only a partial solution, since homes with pets in the yard still have higher concentrations of animal allergens. . Before getting a pet, ask your allergist to determine if you are allergic to animals. If your pet is already considered part of your family, try to minimize contact and keep the pet out of the  bedroom and other rooms where you spend a great deal of time. . As with dust mites, vacuum carpets often or replace carpet with a hardwood floor, tile or linoleum. . High-efficiency particulate air (HEPA) cleaners can reduce allergen levels over time. . While dander and saliva are the source of cat and dog allergens, urine is the source of allergens from rabbits, hamsters, mice and [place] pigs; so ask a non-allergic family member to clean the animal's cage. . If you have a pet allergy, talk to your allergist about the potential for allergy  immunotherapy (allergy shots). This strategy can often provide long-term relief.

## 2019-11-29 NOTE — Assessment & Plan Note (Addendum)
Perennial rhino conjunctivitis symptoms for 20+ years with worsening in the spring and summer. Other triggers include cats. Tried allegra, zyrtec, Claritin, Singulair, azelastine, Flonase and OTC eye drops with minimal benefit. 2015 skin testing in the past was positive to multiple pollens, cat, mold per patient report. On allergy injections in the 1990s with good benefit. No systemic reactions. On Protonix 40mg  for reflux.   Today's skin testing showed: Positive to grass, ragweed. Borderline to mold and cat. Results given.   Start environmental control measures as below.  May use over the counter antihistamines such as Xyzal (levocetirizine) daily as needed - samples given.   May use azelastine 1-2 spray per nostril twice a day as needed drainage.   Will stay away from steroid nasal sprays for now due to frequent epistaxis.   Nasal saline spray (i.e., Simply Saline) or nasal saline lavage (i.e., NeilMed) is recommended as needed.  Had a detailed discussion with patient/family that clinical history is suggestive of allergic rhinitis, and may benefit from allergy immunotherapy (AIT). Discussed in detail regarding the dosing, schedule, side effects (mild to moderate local allergic reaction and rarely systemic allergic reactions including anaphylaxis), and benefits (significant improvement in nasal symptoms, seasonal flares of asthma) of immunotherapy with the patient. There is significant time commitment involved with allergy shots, which includes weekly immunotherapy injections for first 9-12 months and then biweekly to monthly injections for 3-5 years. Consent signed.  Start allergy injections.

## 2019-11-29 NOTE — Assessment & Plan Note (Signed)
Reactions to azithromycin and doxycycline in the past.  Continue to avoid.

## 2019-11-29 NOTE — Assessment & Plan Note (Signed)
History of frequent upper and lower respiratory infections including bronchitis/pneumonia once a year. No previous immune evaluation.  Keep track of infections.  Check basic immune bloodwork.

## 2019-12-03 DIAGNOSIS — M9901 Segmental and somatic dysfunction of cervical region: Secondary | ICD-10-CM | POA: Diagnosis not present

## 2019-12-03 DIAGNOSIS — M62838 Other muscle spasm: Secondary | ICD-10-CM | POA: Diagnosis not present

## 2019-12-03 DIAGNOSIS — M5386 Other specified dorsopathies, lumbar region: Secondary | ICD-10-CM | POA: Diagnosis not present

## 2019-12-03 DIAGNOSIS — F4310 Post-traumatic stress disorder, unspecified: Secondary | ICD-10-CM | POA: Diagnosis not present

## 2019-12-03 DIAGNOSIS — M9903 Segmental and somatic dysfunction of lumbar region: Secondary | ICD-10-CM | POA: Diagnosis not present

## 2019-12-03 DIAGNOSIS — J301 Allergic rhinitis due to pollen: Secondary | ICD-10-CM | POA: Diagnosis not present

## 2019-12-03 DIAGNOSIS — M9902 Segmental and somatic dysfunction of thoracic region: Secondary | ICD-10-CM | POA: Diagnosis not present

## 2019-12-04 DIAGNOSIS — J3081 Allergic rhinitis due to animal (cat) (dog) hair and dander: Secondary | ICD-10-CM | POA: Diagnosis not present

## 2019-12-10 ENCOUNTER — Encounter: Payer: Self-pay | Admitting: Licensed Clinical Social Worker

## 2019-12-10 ENCOUNTER — Ambulatory Visit: Payer: Self-pay | Admitting: Licensed Clinical Social Worker

## 2019-12-10 DIAGNOSIS — Z803 Family history of malignant neoplasm of breast: Secondary | ICD-10-CM

## 2019-12-10 DIAGNOSIS — Z1379 Encounter for other screening for genetic and chromosomal anomalies: Secondary | ICD-10-CM | POA: Insufficient documentation

## 2019-12-10 DIAGNOSIS — Z808 Family history of malignant neoplasm of other organs or systems: Secondary | ICD-10-CM

## 2019-12-10 DIAGNOSIS — Z8042 Family history of malignant neoplasm of prostate: Secondary | ICD-10-CM

## 2019-12-10 DIAGNOSIS — Z800 Family history of malignant neoplasm of digestive organs: Secondary | ICD-10-CM

## 2019-12-10 NOTE — Telephone Encounter (Signed)
Revealed negative genetic testing.  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 This normal result is reassuring.  It is unlikely that there is an increased risk of cancer due to a mutation in one of these genes. However, genetic testing is not perfect, and cannot definitively rule out a hereditary cause.  It will be important for her to keep in contact with genetics to learn if any additional testing may be needed in the future.  It is also possible there is a hereditary cause for the cancer in the family that she did not inherit. We recommended both sides of the family have genetic counseling and testing.

## 2019-12-10 NOTE — Progress Notes (Signed)
HPI:  Barbara Sellers was previously seen in the [facility] due to a family history of cancer and concerns regarding a hereditary predisposition to cancer. Please refer to our prior cancer genetics clinic note for more information regarding our discussion, assessment and recommendations, at the time. Barbara Sellers recent genetic test results were disclosed to her, as were recommendations warranted by these results. These results and recommendations are discussed in more detail below.  CANCER HISTORY:  Oncology History   No history exists.    FAMILY HISTORY:  We obtained a detailed, 4-generation family history.  Significant diagnoses are listed below: Family History  Problem Relation Age of Onset  . Macular degeneration Mother   . Urticaria Mother   . Colon polyps Father   . Diabetes Father   . Diverticulitis Father   . Cancer Father   . Cancer - Other Father        Adenocarcinoma  . Allergic rhinitis Father   . Asthma Father   . Eczema Father   . Allergic rhinitis Brother   . Arrhythmia Brother        half-brother  . Allergic rhinitis Brother   . Diabetes Maternal Aunt   . Allergic rhinitis Maternal Aunt   . Cancer Maternal Grandmother        SKIN  . Cancer Maternal Grandfather        SKIN  . Hypertension Paternal Grandmother   . Skin cancer Paternal Grandmother   . Allergic rhinitis Paternal Grandmother   . Asthma Paternal Grandmother   . Hypertension Paternal Grandfather   . Cancer Paternal Grandfather        melanoma, kidney, bladder, prostate, lung, liver cancer  . Skin cancer Paternal Grandfather   . Melanoma Paternal Aunt   . Allergic rhinitis Paternal Aunt   . Asthma Paternal Aunt   . Pancreatic cancer Other   . Breast cancer Maternal Aunt 59  . Allergic rhinitis Maternal Aunt   . Pancreatic cancer Maternal Aunt   . Allergic rhinitis Maternal Aunt   . Allergic rhinitis Maternal Aunt   . Asthma Maternal Aunt   . Melanoma Paternal Aunt   .  Allergic rhinitis Paternal Aunt   . Asthma Paternal Aunt   . Melanoma Other     Barbara Sellers does not have children. She has 2 maternal half brothers.   Barbara Sellers mother is 78 and has not had cancer. Patient had 4 maternal aunts. One recently died due to metastatic breast cancer at 73. Another aunt had pancreatic cancer and died at 102. Maternal cousins have had melanoma and possibly other cancers. Maternal grandparents both had skin cancers, grandmother died at 39, grandfather died in his 84s. Grandfather had 2 brothers who had pancreatic cancer.  Barbara Sellers father was diagnosed with stage IV cancer originating from the neck. Patient has 3 paternal aunts (one is father's twin) and 1 paternal uncle. Two aunts have had melanoma. Her paternal cousins have also had melanoma. Grandfather had melanoma in his 18s, and then was diagnosed with several cancers: lung, liver, kidney, prostate and bladder. Patient reports these were all separate cancers. Paternal grandmother is living in her 45s.  Barbara Sellers is unaware of previous family history of genetic testing for hereditary cancer risks. Patient's maternal ancestors are of Irish/German/[place] descent, and paternal ancestors are of English/German/Irish descent. There is no reported Ashkenazi Jewish ancestry. There is no known consanguinity.  GENETIC TEST RESULTS: Genetic testing reported out on 11/27/2019 through the [facility]  Common Hereditary Cancers + Melanoma  panel found no pathogenic mutations.   The Common Hereditary Cancers Panel + Melanoma panel offered by [facility] includes sequencing and/or deletion duplication testing of the following 52 genes: APC*, ATM*, AXIN2, BAP1, BARD1, BMPR1A, BRCA1, BRCA2, BRIP1, CDH1, CDK4, CDKN2A (p14ARF), CDKN2A (p16INK4a), CHEK2, CTNNA1, DICER1*, EPCAM*, GREM1*, HOXB13, KIT, MEN1*, MITF*, MLH1*, MSH2*, MSH3*, MSH6*, MUTYH, NBN, NF1*, NTHL1, PALB2, PDGFRA, PMS2*, POLD1*, POLE, POT1, PTEN*, RAD50, RAD51C, RAD51D,  RB1*, RNF43, SDHA*, SDHB, SDHC*, SDHD, SMAD4, SMARCA4, STK11, TP53, TSC1*, TSC2, VHL.  The test report has been scanned into EPIC and is located under the Molecular Pathology section of the Results Review tab.  A portion of the result report is included below for reference.     We discussed with Barbara Sellers that because current genetic testing is not perfect, it is possible there may be a gene mutation in one of these genes that current testing cannot detect, but that chance is small.  We also discussed, that there could be another gene that has not yet been discovered, or that we have not yet tested, that is responsible for the cancer diagnoses in the family. It is also possible there is a hereditary cause for the cancer in the family that Barbara Sellers did not inherit and therefore was not identified in her testing.  Therefore, it is important to remain in touch with cancer genetics in the future so that we can continue to offer Barbara Sellers the most up to date genetic testing.   Genetic testing did identify a variant of uncertain significance (VUS) in the POLE gene called [id number].[id number].  At this time, it is unknown if this variant is associated with increased cancer risk or if this is a normal finding, but most variants such as this get reclassified to being inconsequential. It should not be used to make medical management decisions. With time, we suspect the lab will determine the significance of this variant, if any. If we do learn more about it, we will try to contact [name] to discuss it further. However, it is important to stay in touch with [place] periodically and keep the address and phone number up to date.  ADDITIONAL GENETIC TESTING: We discussed with Barbara Sellers that her genetic testing was fairly extensive.  If there are genes identified to increase cancer risk that can be analyzed in the future, we would be happy to discuss and coordinate this testing at that time.    CANCER SCREENING RECOMMENDATIONS: Ms.  Sellers test result is considered negative (normal).  This means that we have not identified a hereditary cause for her  family history of cancer at this time.  While reassuring, this does not definitively rule out a hereditary predisposition to cancer. It is still possible that there could be genetic mutations that are undetectable by current technology. There could be genetic mutations in genes that have not been tested or identified to increase cancer risk.  Therefore, it is recommended she continue to follow the cancer management and screening guidelines provided by her  primary healthcare provider.   An individual's cancer risk and medical management are not determined by genetic test results alone. Overall cancer risk assessment incorporates additional factors, including personal medical history, family history, and any available genetic information that may result in a personalized plan for cancer prevention and surveillance.  RECOMMENDATIONS FOR FAMILY MEMBERS:  Relatives in this family might be at some increased risk of developing cancer, over the general population risk, simply due  to the family history of cancer.  We recommended female relatives in this family have a yearly mammogram beginning at age 46, or 51 years younger than the earliest onset of cancer, an annual clinical breast exam, and perform monthly breast self-exams. Female relatives in this family should also have a gynecological exam as recommended by their primary provider. All family members should have a colonoscopy by age 81, or as directed by their physicians.   It is also possible there is a hereditary cause for the cancer in Ms. Oshea family that she did not inherit and therefore was not identified in her.  Based on Ms. Alen family history, we recommended her maternal and paternal relatives have genetic counseling and testing, and that her mother/maternal relatives may qualify for pancreatic cancer screening. Ms. Loyd  will let us know if we can be of any assistance in coordinating genetic counseling and/or testing for these family members.  FOLLOW-UP: Lastly, we discussed with Ms. Buttermore that cancer genetics is a rapidly advancing field and it is possible that new genetic tests will be appropriate for her and/or her family members in the future. We encouraged her to remain in contact with cancer genetics on an annual basis so we can update her personal and family histories and let her know of advances in cancer genetics that may benefit this family.   Our contact number was provided. Ms. Scaduto questions were answered to her satisfaction, and she knows she is welcome to call us at anytime with additional questions or concerns.   Faith Rogue, MS, [facility] Genetic Counselor [email or web address] Phone: [phone]

## 2019-12-19 DIAGNOSIS — M62838 Other muscle spasm: Secondary | ICD-10-CM | POA: Diagnosis not present

## 2019-12-19 DIAGNOSIS — M9902 Segmental and somatic dysfunction of thoracic region: Secondary | ICD-10-CM | POA: Diagnosis not present

## 2019-12-19 DIAGNOSIS — M9903 Segmental and somatic dysfunction of lumbar region: Secondary | ICD-10-CM | POA: Diagnosis not present

## 2019-12-19 DIAGNOSIS — G4489 Other headache syndrome: Secondary | ICD-10-CM | POA: Diagnosis not present

## 2019-12-19 DIAGNOSIS — M5386 Other specified dorsopathies, lumbar region: Secondary | ICD-10-CM | POA: Diagnosis not present

## 2019-12-19 DIAGNOSIS — M9901 Segmental and somatic dysfunction of cervical region: Secondary | ICD-10-CM | POA: Diagnosis not present

## 2019-12-20 ENCOUNTER — Other Ambulatory Visit: Payer: Self-pay

## 2019-12-20 ENCOUNTER — Ambulatory Visit (INDEPENDENT_AMBULATORY_CARE_PROVIDER_SITE_OTHER): Payer: BC Managed Care – PPO

## 2019-12-20 DIAGNOSIS — J309 Allergic rhinitis, unspecified: Secondary | ICD-10-CM | POA: Diagnosis not present

## 2019-12-20 NOTE — Progress Notes (Signed)
Immunotherapy   Patient Details  Name: Barbara Sellers MRN: [id number] Date of Birth: Jul 06, 1990  12/20/2019  [name] here to start allergy injections. Patient received 0.05 of both her blue vials with an expiration of 11/28/2020. One with G-RW and the other with M-C. Patient waited 30 minutes with no problems. Following schedule: B  Frequency: Weekly Epi-Pen: Yes Consent signed and patient instructions given.   Dub Mikes 12/20/2019, 8:50 AM

## 2019-12-25 ENCOUNTER — Ambulatory Visit (INDEPENDENT_AMBULATORY_CARE_PROVIDER_SITE_OTHER): Payer: BC Managed Care – PPO

## 2019-12-25 ENCOUNTER — Other Ambulatory Visit: Payer: Self-pay

## 2019-12-25 DIAGNOSIS — J309 Allergic rhinitis, unspecified: Secondary | ICD-10-CM

## 2020-01-01 ENCOUNTER — Ambulatory Visit (INDEPENDENT_AMBULATORY_CARE_PROVIDER_SITE_OTHER): Payer: BC Managed Care – PPO

## 2020-01-01 ENCOUNTER — Other Ambulatory Visit: Payer: Self-pay

## 2020-01-01 DIAGNOSIS — J309 Allergic rhinitis, unspecified: Secondary | ICD-10-CM | POA: Diagnosis not present

## 2020-01-02 DIAGNOSIS — M9903 Segmental and somatic dysfunction of lumbar region: Secondary | ICD-10-CM | POA: Diagnosis not present

## 2020-01-02 DIAGNOSIS — M62838 Other muscle spasm: Secondary | ICD-10-CM | POA: Diagnosis not present

## 2020-01-02 DIAGNOSIS — M9902 Segmental and somatic dysfunction of thoracic region: Secondary | ICD-10-CM | POA: Diagnosis not present

## 2020-01-02 DIAGNOSIS — M5386 Other specified dorsopathies, lumbar region: Secondary | ICD-10-CM | POA: Diagnosis not present

## 2020-01-02 DIAGNOSIS — M9901 Segmental and somatic dysfunction of cervical region: Secondary | ICD-10-CM | POA: Diagnosis not present

## 2020-01-22 ENCOUNTER — Other Ambulatory Visit: Payer: Self-pay

## 2020-01-22 ENCOUNTER — Ambulatory Visit: Payer: BC Managed Care – PPO | Admitting: Osteopathic Medicine

## 2020-01-22 ENCOUNTER — Encounter: Payer: Self-pay | Admitting: Osteopathic Medicine

## 2020-01-22 VITALS — BP 122/80 | HR 74 | Wt 150.0 lb

## 2020-01-22 DIAGNOSIS — F649 Gender identity disorder, unspecified: Secondary | ICD-10-CM

## 2020-01-22 DIAGNOSIS — Z8659 Personal history of other mental and behavioral disorders: Secondary | ICD-10-CM

## 2020-01-22 DIAGNOSIS — R5382 Chronic fatigue, unspecified: Secondary | ICD-10-CM | POA: Diagnosis not present

## 2020-01-22 NOTE — Progress Notes (Signed)
Barbara Sellers is a 29 y.o. female who presents to  [facility]  today, 01/22/20, seeking care for the following:  . Rechecking mental health and fatigue - better now that allergist has gotten inhalers/allergy shots taken care of. Still feeling tired.Still working through some mental health issues - never ehard back about counseling      ASSESSMENT & PLAN with other pertinent findings:   The primary encounter diagnosis was Chronic fatigue. Diagnoses of History of ADHD and Gender identity disorder were also pertinent to this visit.   No results found for this or any previous visit (from the past 24 hour(s)).     Patient Instructions  Please call [facility] - referral is in place! [phone] OK to increase Trazodone to 2 tablets at night if needed      No orders of the defined types were placed in this encounter.   No orders of the defined types were placed in this encounter.      Follow-up instructions: Return for recheck depending on how things go w/ counseling, otherwise annual 09/2020.                                         BP 122/80 (BP Location: Left Arm, Patient Position: Sitting)   Pulse 74   Wt 150 lb (68 kg)   SpO2 98%   BMI 27.00 kg/m   Current Meds  Medication Sig  . Drospirenone-Ethinyl Estradiol (NIKKI PO) Take by mouth.  . EPINEPHrine 0.1 MG/0.1ML SOAJ Inject as directed.  . Fluticasone-Salmeterol,sensor, (AIRDUO DIGIHALER) 113-14 MCG/ACT AEPB Inhale 1 puff into the lungs 2 (two) times daily. Rinse mouth after each use  . Multiple Vitamins-Minerals (MULTIVITAMIN ADULTS PO) Take by mouth.  Marland Kitchen NIKKI 3-0.02 MG tablet Take 1 tablet by mouth daily.  . pantoprazole (PROTONIX) 40 MG tablet Take 40 mg by mouth daily.  . prazosin (MINIPRESS) 1 MG capsule Take by mouth at bedtime as needed.  . sertraline (ZOLOFT) 100 MG tablet Take 100 mg by mouth daily.    No  results found for this or any previous visit (from the past 72 hour(s)).  No results found.     All questions at time of visit were answered - patient instructed to contact office with any additional concerns or updates.  ER/RTC precautions were reviewed with the patient as applicable.   Please note: voice recognition software was used to produce this document, and typos may escape review. Please contact Dr. Lyn Hollingshead for any needed clarifications.   Total encounter time: 15 minutes.

## 2020-01-22 NOTE — Patient Instructions (Addendum)
Please call [facility] - referral is in place! [phone] OK to increase Trazodone to 2 tablets at night if needed

## 2020-01-31 ENCOUNTER — Ambulatory Visit (INDEPENDENT_AMBULATORY_CARE_PROVIDER_SITE_OTHER): Payer: BC Managed Care – PPO

## 2020-01-31 ENCOUNTER — Other Ambulatory Visit: Payer: Self-pay

## 2020-01-31 DIAGNOSIS — J309 Allergic rhinitis, unspecified: Secondary | ICD-10-CM | POA: Diagnosis not present

## 2020-02-06 DIAGNOSIS — M5386 Other specified dorsopathies, lumbar region: Secondary | ICD-10-CM | POA: Diagnosis not present

## 2020-02-06 DIAGNOSIS — M9902 Segmental and somatic dysfunction of thoracic region: Secondary | ICD-10-CM | POA: Diagnosis not present

## 2020-02-06 DIAGNOSIS — M9903 Segmental and somatic dysfunction of lumbar region: Secondary | ICD-10-CM | POA: Diagnosis not present

## 2020-02-06 DIAGNOSIS — M9901 Segmental and somatic dysfunction of cervical region: Secondary | ICD-10-CM | POA: Diagnosis not present

## 2020-02-06 DIAGNOSIS — M62838 Other muscle spasm: Secondary | ICD-10-CM | POA: Diagnosis not present

## 2020-02-07 ENCOUNTER — Ambulatory Visit (INDEPENDENT_AMBULATORY_CARE_PROVIDER_SITE_OTHER): Payer: BC Managed Care – PPO

## 2020-02-07 ENCOUNTER — Other Ambulatory Visit: Payer: Self-pay

## 2020-02-07 DIAGNOSIS — J309 Allergic rhinitis, unspecified: Secondary | ICD-10-CM

## 2020-02-14 ENCOUNTER — Other Ambulatory Visit: Payer: Self-pay

## 2020-02-14 ENCOUNTER — Telehealth: Payer: Self-pay

## 2020-02-14 ENCOUNTER — Ambulatory Visit (INDEPENDENT_AMBULATORY_CARE_PROVIDER_SITE_OTHER): Payer: BC Managed Care – PPO

## 2020-02-14 DIAGNOSIS — J309 Allergic rhinitis, unspecified: Secondary | ICD-10-CM | POA: Diagnosis not present

## 2020-02-14 NOTE — Telephone Encounter (Signed)
Patient came in to get her allergy injections and informed us that the pharmacy was suppose to contact us in regards to PA for Airduo 113. I informed patient that unfortunately we have not received anything from the pharmacy. I did inform patient that we complete the PA and once we had an answer would contact her to inform her. Patient verbalized understanding.

## 2020-02-18 MED ORDER — FLUTICASONE FUROATE-VILANTEROL 100-25 MCG/INH IN AEPB: 1.0000 | INHALATION_SPRAY | Freq: Every day | RESPIRATORY_TRACT | 5 refills | Status: DC

## 2020-02-18 NOTE — Telephone Encounter (Signed)
Sent in prescription for Breo 1 puff once a day. Rinse mouth after each use.

## 2020-02-18 NOTE — Telephone Encounter (Signed)
PA was denied for Airduo. Preferred alternatives are Symbicort, Dulera, Breo, Flovent, Anoro, and Trelegy. Please advise.

## 2020-02-18 NOTE — Telephone Encounter (Signed)
Attempted to call and advise patient. Phone was turned off and went straight to voicemail, mailbox was full and was unable to leave a voicemail. Will need to attempt to call back shortly.

## 2020-02-18 NOTE — Telephone Encounter (Signed)
PA has been submitted through CoverMyMeds and is currently pending approval or denial.  

## 2020-02-18 NOTE — Addendum Note (Signed)
Addended by: Ellamae Sia on: 02/18/2020 01:11 PM   Modules accepted: Orders

## 2020-02-21 DIAGNOSIS — F4310 Post-traumatic stress disorder, unspecified: Secondary | ICD-10-CM | POA: Diagnosis not present

## 2020-02-26 ENCOUNTER — Other Ambulatory Visit: Payer: Self-pay

## 2020-02-26 ENCOUNTER — Ambulatory Visit (INDEPENDENT_AMBULATORY_CARE_PROVIDER_SITE_OTHER): Payer: BC Managed Care – PPO

## 2020-02-26 DIAGNOSIS — J309 Allergic rhinitis, unspecified: Secondary | ICD-10-CM | POA: Diagnosis not present

## 2020-03-04 ENCOUNTER — Other Ambulatory Visit: Payer: Self-pay

## 2020-03-04 ENCOUNTER — Encounter: Payer: Self-pay | Admitting: Allergy

## 2020-03-04 ENCOUNTER — Ambulatory Visit: Payer: BC Managed Care – PPO | Admitting: Allergy

## 2020-03-04 VITALS — BP 112/78 | HR 76 | Temp 98.6°F | Resp 20

## 2020-03-04 DIAGNOSIS — Z8709 Personal history of other diseases of the respiratory system: Secondary | ICD-10-CM

## 2020-03-04 DIAGNOSIS — J309 Allergic rhinitis, unspecified: Secondary | ICD-10-CM | POA: Diagnosis not present

## 2020-03-04 DIAGNOSIS — H1010 Acute atopic conjunctivitis, unspecified eye: Secondary | ICD-10-CM | POA: Insufficient documentation

## 2020-03-04 DIAGNOSIS — T50905D Adverse effect of unspecified drugs, medicaments and biological substances, subsequent encounter: Secondary | ICD-10-CM

## 2020-03-04 DIAGNOSIS — J3089 Other allergic rhinitis: Secondary | ICD-10-CM

## 2020-03-04 DIAGNOSIS — J4540 Moderate persistent asthma, uncomplicated: Secondary | ICD-10-CM | POA: Diagnosis not present

## 2020-03-04 DIAGNOSIS — M9902 Segmental and somatic dysfunction of thoracic region: Secondary | ICD-10-CM | POA: Diagnosis not present

## 2020-03-04 DIAGNOSIS — M5386 Other specified dorsopathies, lumbar region: Secondary | ICD-10-CM | POA: Diagnosis not present

## 2020-03-04 DIAGNOSIS — M62838 Other muscle spasm: Secondary | ICD-10-CM | POA: Diagnosis not present

## 2020-03-04 DIAGNOSIS — M9903 Segmental and somatic dysfunction of lumbar region: Secondary | ICD-10-CM | POA: Diagnosis not present

## 2020-03-04 DIAGNOSIS — T781XXD Other adverse food reactions, not elsewhere classified, subsequent encounter: Secondary | ICD-10-CM

## 2020-03-04 DIAGNOSIS — M9901 Segmental and somatic dysfunction of cervical region: Secondary | ICD-10-CM | POA: Diagnosis not present

## 2020-03-04 DIAGNOSIS — J302 Other seasonal allergic rhinitis: Secondary | ICD-10-CM

## 2020-03-04 NOTE — Patient Instructions (Addendum)
Environmental allergies  Past skin testing showed: Positive to grass, ragweed. Borderline to mold and cat.  Continue environmental control measures as below.  May use over the counter antihistamines such as Xyzal (levocetirizine) daily as needed.  May use azelastine 1-2 spray per nostril twice a day as needed drainage.   Nasal saline spray (i.e., Simply Saline) or nasal saline lavage (i.e., NeilMed) is recommended as needed.  May use refresh eye drops as needed .  Continue allergy injections.   Asthma: . Daily controller medication(s): continue Breo 1 puff once a day and rinse mouth afterwards. If this inhaler does not work as well then let us know and will switch back to Aiduo.  . Prior to physical activity: May use albuterol rescue inhaler 2 puffs 5 to 15 minutes prior to strenuous physical activities. Marland Kitchen Rescue medications: May use albuterol rescue inhaler 2 puffs or nebulizer every 4 to 6 hours as needed for shortness of breath, chest tightness, coughing, and wheezing. Monitor frequency of use.  . Asthma control goals:  o Full participation in all desired activities (may need albuterol before activity) o Albuterol use two times or less a week on average (not counting use with activity) o Cough interfering with sleep two times or less a month o Oral steroids no more than once a year o No hospitalizations  Food allergy:  Continue strict avoidance of cayenne pepper.  For mild symptoms you can take over the counter antihistamines such as Benadryl and monitor symptoms closely. If symptoms worsen or if you have severe symptoms including breathing issues, throat closure, significant swelling, whole body hives, severe diarrhea and vomiting, lightheadedness then inject epinephrine and seek immediate medical care afterwards.  Emergency action plan in place.   Frequent infections:  Keep track of infections.  Follow up in 6 months or sooner if needed.   Reducing Pollen  Exposure . Pollen seasons: trees (spring), grass (summer) and ragweed/weeds (fall). Marland Kitchen Keep windows closed in your home and car to lower pollen exposure.  Lilian Kapur air conditioning in the bedroom and throughout the house if possible.  . Avoid going out in dry windy days - especially early morning. . Pollen counts are highest between 5 - 10 AM and on dry, hot and windy days.  . Save outside activities for late afternoon or after a heavy rain, when pollen levels are lower.  . Avoid mowing of grass if you have grass pollen allergy. Marland Kitchen Be aware that pollen can also be transported indoors on people and pets.  . Dry your clothes in an automatic dryer rather than hanging them outside where they might collect pollen.  . Rinse hair and eyes before bedtime.  Mold Control . Mold and fungi can grow on a variety of surfaces provided certain temperature and moisture conditions exist.  . Outdoor molds grow on plants, decaying vegetation and soil. The major outdoor mold, Alternaria and Cladosporium, are found in very high numbers during hot and dry conditions. Generally, a late summer - fall peak is seen for common outdoor fungal spores. Rain will temporarily lower outdoor mold spore count, but counts rise rapidly when the rainy period ends. . The most important indoor molds are Aspergillus and Penicillium. Dark, humid and poorly ventilated basements are ideal sites for mold growth. The next most common sites of mold growth are the bathroom and the kitchen. Outdoor (Seasonal) Mold Control . Use air conditioning and keep windows closed. . Avoid exposure to decaying vegetation. Marland Kitchen Avoid leaf raking. Marland Kitchen  Avoid grain handling. . Consider wearing a face mask if working in moldy areas.  Indoor (Perennial) Mold Control  . Maintain humidity below 50%. . Get rid of mold growth on hard surfaces with water, detergent and, if necessary, 5% bleach (do not mix with other cleaners). Then dry the area completely. If mold covers  an area more than 10 square feet, consider hiring an indoor environmental professional. . For clothing, washing with soap and water is best. If moldy items cannot be cleaned and dried, throw them away. . Remove sources e.g. contaminated carpets. . Repair and seal leaking roofs or pipes. Using dehumidifiers in damp basements may be helpful, but empty the water and clean units regularly to prevent mildew from forming. All rooms, especially basements, bathrooms and kitchens, require ventilation and cleaning to deter mold and mildew growth. Avoid carpeting on concrete or damp floors, and storing items in damp areas. Pet Allergen Avoidance: . Contrary to popular opinion, there are no "hypoallergenic" breeds of dogs or cats. That is because people are not allergic to an animal's hair, but to an allergen found in the animal's saliva, dander (dead skin flakes) or urine. Pet allergy symptoms typically occur within minutes. For some people, symptoms can build up and become most severe 8 to 12 hours after contact with the animal. People with severe allergies can experience reactions in public places if dander has been transported on the pet owners' clothing. Marland Kitchen Keeping an animal outdoors is only a partial solution, since homes with pets in the yard still have higher concentrations of animal allergens. . Before getting a pet, ask your allergist to determine if you are allergic to animals. If your pet is already considered part of your family, try to minimize contact and keep the pet out of the bedroom and other rooms where you spend a great deal of time. . As with dust mites, vacuum carpets often or replace carpet with a hardwood floor, tile or linoleum. . High-efficiency particulate air (HEPA) cleaners can reduce allergen levels over time. . While dander and saliva are the source of cat and dog allergens, urine is the source of allergens from rabbits, hamsters, mice and [place] pigs; so ask a non-allergic family member  to clean the animal's cage. . If you have a pet allergy, talk to your allergist about the potential for allergy immunotherapy (allergy shots). This strategy can often provide long-term relief.

## 2020-03-04 NOTE — Assessment & Plan Note (Signed)
Past history - History of frequent upper and lower respiratory infections including bronchitis/pneumonia once a year. No previous immune evaluation.  Keep track of infections.  Bloodwork not drawn yet - will check if having recurrent infections.

## 2020-03-04 NOTE — Assessment & Plan Note (Signed)
Past history - Reactions to azithromycin and doxycycline in the past.  Continue to avoid.

## 2020-03-04 NOTE — Assessment & Plan Note (Signed)
Past history - Perennial rhino conjunctivitis symptoms for 20+ years with worsening in the spring and summer. Other triggers include cats. Tried allegra, zyrtec, Claritin, Singulair, azelastine, Flonase and OTC eye drops with minimal benefit. 2015 skin testing in the past was positive to multiple pollens, cat, mold per patient report. On allergy injections in the 1990s with good benefit. No systemic reactions. On Protonix 40mg  for reflux. 2021 skin testing showed: Positive to grass, ragweed. Borderline to mold and cat.  Interim history - doing well with below regimen. Started AIT on 12/20/2019 (G-RW and M-C).   Continue environmental control measures as below.  May use over the counter antihistamines such as Xyzal (levocetirizine) daily as needed.  May use azelastine 1-2 spray per nostril twice a day as needed drainage.   Nasal saline spray (i.e., Simply Saline) or nasal saline lavage (i.e., NeilMed) is recommended as needed.  May use refresh eye drops as needed .  Continue allergy injections.

## 2020-03-04 NOTE — Assessment & Plan Note (Signed)
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 Tolerates other peppers including jalapenos with no issues. Last testing done in 2015. Interim history - no reactions.   Continue strict avoidance of cayenne pepper.  For mild symptoms you can take over the counter antihistamines such as Benadryl and monitor symptoms closely. If symptoms worsen or if you have severe symptoms including breathing issues, throat closure, significant swelling, whole body hives, severe diarrhea and vomiting, lightheadedness then inject epinephrine and seek immediate medical care afterwards.  Emergency action plan in place.

## 2020-03-04 NOTE — Progress Notes (Signed)
Follow Up Note  RE: Barbara Sellers MRN: [id number] DOB: 30-Jun-1990 Date of Office Visit: 03/04/2020  Referring provider: Sunnie Nielsen, DO Primary care provider: Sunnie Nielsen, DO  Chief Complaint: Allergic Rhinitis   History of Present Illness: I had the pleasure of seeing Barbara Sellers for a follow up visit at the [facility] on 03/04/2020. She is a 29 y.o. female, who is being followed for allergic rhinoconjunctivitis, reactive airway disease, history of frequent upper respiratory infection, adverse food reaction, adverse drug reaction. Her previous allergy office visit was on 11/29/2019 with Dr. Selena Batten. Today is a regular follow up visit.  Other allergic rhinitis Started allergy injections and doing well.  Taking OTC antihistamine in the morning. Not needing to use any eye drops or nasal sprays.   Reactive airway disease Currently on Breo 1 puff once a day and doing well. Used Airduo for 1 month then switched to [employer] due to insurance.   No rescue inhaler use.  Denies any SOB, coughing, wheezing, chest tightness, nocturnal awakenings, ER/urgent care visits or prednisone use since the last visit.  History of frequent upper respiratory infection No infections.   Adverse food reaction Avoiding cayenne peppers.   Assessment and Plan: Barbara Sellers is a 29 y.o. female with: Reactive airway disease Past history - Diagnosed with asthma as a child and was doing well up until recently. She had COVID-19 in January 2021. Recently started on Incruse and using albuterol less but was using it multiple times per day. Prednisone did not help. CXR on 07/11/2019 - Small focus of airspace disease in the right lung base. 2021 spirometry was normal with no improvement in FEV1 post bronchodilator treatment. Clinically feeling the same.  Interim history - Doing better. Airduo was $30 and Virgel Bouquet was $0 so now on Breo.  . Daily controller medication(s): continue Breo 1 puff  once a day and rinse mouth afterwards. If this inhaler does not work as well then let us know and will switch back to Airduo.  . Prior to physical activity: May use albuterol rescue inhaler 2 puffs 5 to 15 minutes prior to strenuous physical activities. Marland Kitchen Rescue medications: May use albuterol rescue inhaler 2 puffs or nebulizer every 4 to 6 hours as needed for shortness of breath, chest tightness, coughing, and wheezing. Monitor frequency of use.  . Repeat spirometry at next visit.   Seasonal and perennial allergic rhinoconjunctivitis Past history - Perennial rhino conjunctivitis symptoms for 20+ years with worsening in the spring and summer. Other triggers include cats. Tried allegra, zyrtec, Claritin, Singulair, azelastine, Flonase and OTC eye drops with minimal benefit. 2015 skin testing in the past was positive to multiple pollens, cat, mold per patient report. On allergy injections in the 1990s with good benefit. No systemic reactions. On Protonix 40mg  for reflux. 2021 skin testing showed: Positive to grass, ragweed. Borderline to mold and cat.  Interim history - doing well with below regimen. Started AIT on 12/20/2019 (G-RW and M-C).   Continue environmental control measures as below.  May use over the counter antihistamines such as Xyzal (levocetirizine) daily as needed.  May use azelastine 1-2 spray per nostril twice a day as needed drainage.   Nasal saline spray (i.e., Simply Saline) or nasal saline lavage (i.e., NeilMed) is recommended as needed.  May use refresh eye drops as needed .  Continue allergy injections.   History of frequent upper respiratory infection Past history - History of frequent upper and lower respiratory infections including  bronchitis/pneumonia once a year. No previous immune evaluation.  Keep track of infections.  Bloodwork not drawn yet - will check if having recurrent infections.   Adverse food reaction Past history - Anaphylactic reaction to cayenne  pepper in the past. Tolerates other peppers including jalapenos with no issues. Last testing done in 2015. Interim history - no reactions.   Continue strict avoidance of cayenne pepper.  For mild symptoms you can take over the counter antihistamines such as Benadryl and monitor symptoms closely. If symptoms worsen or if you have severe symptoms including breathing issues, throat closure, significant swelling, whole body hives, severe diarrhea and vomiting, lightheadedness then inject epinephrine and seek immediate medical care afterwards.  Emergency action plan in place.   Drug reaction Past history - Reactions to azithromycin and doxycycline in the past.  Continue to avoid.   Return in about 6 months (around 09/01/2020).  Diagnostics: Spirometry:  Tracings reviewed. Her effort: Good reproducible efforts. FVC: 3.08L FEV1: 2.78L FEV1/FVC ratio: 90%  Medication List:  Current Outpatient Medications  Medication Sig Dispense Refill  . albuterol (PROVENTIL HFA;VENTOLIN HFA) 108 (90 Base) MCG/ACT inhaler Inhale into the lungs every 6 (six) hours as needed for wheezing or shortness of breath.     . Azelastine HCl 0.15 % SOLN Place 1-2 sprays into both nostrils 2 (two) times daily as needed (for drainage). 30 mL 5  . Drospirenone-Ethinyl Estradiol (NIKKI PO) Take by mouth.    . EPINEPHrine 0.1 MG/0.1ML SOAJ Inject as directed.    . fluticasone furoate-vilanterol (BREO ELLIPTA) 100-25 MCG/INH AEPB Inhale 1 puff into the lungs daily. Rinse mouth after each use. 60 each 5  . Multiple Vitamins-Minerals (MULTIVITAMIN ADULTS PO) Take by mouth.    . pantoprazole (PROTONIX) 40 MG tablet Take 40 mg by mouth daily.    . prazosin (MINIPRESS) 1 MG capsule Take by mouth at bedtime as needed.    . sertraline (ZOLOFT) 100 MG tablet Take 150 mg by mouth daily.     . traZODone (DESYREL) 50 MG tablet Take 100 mg by mouth at bedtime.     . Fluticasone-Salmeterol,sensor, (AIRDUO DIGIHALER) 113-14 MCG/ACT AEPB  Inhale 1 puff into the lungs 2 (two) times daily. Rinse mouth after each use (Patient not taking: Reported on 03/04/2020) 1 each 5   No current facility-administered medications for this visit.   Allergies: Allergies  Allergen Reactions  . Cayenne Anaphylaxis  . Azithromycin Itching and Rash    Also burning.    . Doxycycline Other (See Comments) and Swelling    Esophagus issues. "felt like razor blades" Throat swelling   . Buspar [Buspirone] Anxiety    Rx caused muscle spasms   I reviewed her past medical history, social history, family history, and environmental history and no significant changes have been reported from her previous visit.  Review of Systems  Constitutional: Negative for appetite change, chills, fever and unexpected weight change.  HENT: Negative for congestion, rhinorrhea and sneezing.   Eyes: Negative for itching.  Respiratory: Negative for cough, chest tightness, shortness of breath and wheezing.   Cardiovascular: Negative for chest pain.  Gastrointestinal: Negative for abdominal pain.  Genitourinary: Negative for difficulty urinating.  Skin: Negative for rash.  Allergic/Immunologic: Positive for environmental allergies and food allergies.  Neurological: Positive for headaches.   Objective: BP 112/78   Pulse 76   Temp 98.6 F (37 C) (Oral)   Resp 20   SpO2 97%  There is no height or weight on file to calculate BMI.  Physical Exam Vitals and nursing note reviewed.  Constitutional:      Appearance: She is well-developed.  HENT:     Head: Normocephalic and atraumatic.     Right Ear: External ear normal.     Left Ear: External ear normal.     Nose: Nose normal.     Mouth/Throat:     Mouth: Mucous membranes are moist.     Pharynx: Oropharynx is clear.  Eyes:     Conjunctiva/sclera: Conjunctivae normal.  Cardiovascular:     Rate and Rhythm: Normal rate and regular rhythm.     Heart sounds: Normal heart sounds. No murmur heard.  No friction rub. No  gallop.   Pulmonary:     Effort: Pulmonary effort is normal.     Breath sounds: Normal breath sounds. No wheezing or rales.  Musculoskeletal:     Cervical back: Neck supple.  Skin:    General: Skin is warm.     Findings: No rash.  Neurological:     Mental Status: She is alert and oriented to person, place, and time.  Psychiatric:        Behavior: Behavior normal.    Previous notes and tests were reviewed. The plan was reviewed with the patient/family, and all questions/concerned were addressed.  It was my pleasure to see Barbara Sellers today and participate in her care. Please feel free to contact me with any questions or concerns.  Sincerely,  Wyline Mood, DO Allergy & Immunology  [facility] office: [phone] [facility] office: [phone] [place] office: [phone]

## 2020-03-04 NOTE — Assessment & Plan Note (Signed)
Past history - Diagnosed with asthma as a child and was doing well up until recently. She had COVID-19 in January 2021. Recently started on Incruse and using albuterol less but was using it multiple times per day. Prednisone did not help. CXR on 07/11/2019 - Small focus of airspace disease in the right lung base. 2021 spirometry was normal with no improvement in FEV1 post bronchodilator treatment. Clinically feeling the same.  Interim history - Doing better. Airduo was $30 and Virgel Bouquet was $0 so now on Breo.  . Daily controller medication(s): continue Breo 1 puff once a day and rinse mouth afterwards. If this inhaler does not work as well then let us know and will switch back to Airduo.  . Prior to physical activity: May use albuterol rescue inhaler 2 puffs 5 to 15 minutes prior to strenuous physical activities. Marland Kitchen Rescue medications: May use albuterol rescue inhaler 2 puffs or nebulizer every 4 to 6 hours as needed for shortness of breath, chest tightness, coughing, and wheezing. Monitor frequency of use.  . Repeat spirometry at next visit.

## 2020-03-13 ENCOUNTER — Ambulatory Visit (INDEPENDENT_AMBULATORY_CARE_PROVIDER_SITE_OTHER): Payer: BC Managed Care – PPO

## 2020-03-13 ENCOUNTER — Other Ambulatory Visit: Payer: Self-pay

## 2020-03-13 DIAGNOSIS — J309 Allergic rhinitis, unspecified: Secondary | ICD-10-CM | POA: Diagnosis not present

## 2020-03-20 ENCOUNTER — Other Ambulatory Visit: Payer: Self-pay

## 2020-03-20 ENCOUNTER — Ambulatory Visit (INDEPENDENT_AMBULATORY_CARE_PROVIDER_SITE_OTHER): Payer: BC Managed Care – PPO

## 2020-03-20 DIAGNOSIS — J309 Allergic rhinitis, unspecified: Secondary | ICD-10-CM

## 2020-03-20 DIAGNOSIS — F4310 Post-traumatic stress disorder, unspecified: Secondary | ICD-10-CM | POA: Diagnosis not present

## 2020-03-20 MED ORDER — AIRDUO DIGIHALER 113-14 MCG/ACT IN AEPB: 1.0000 | INHALATION_SPRAY | Freq: Two times a day (BID) | RESPIRATORY_TRACT | 5 refills | Status: DC

## 2020-03-27 DIAGNOSIS — Z03818 Encounter for observation for suspected exposure to other biological agents ruled out: Secondary | ICD-10-CM | POA: Diagnosis not present

## 2020-03-27 DIAGNOSIS — Z20822 Contact with and (suspected) exposure to covid-19: Secondary | ICD-10-CM | POA: Diagnosis not present

## 2020-04-01 ENCOUNTER — Telehealth: Payer: Self-pay

## 2020-04-01 ENCOUNTER — Ambulatory Visit (INDEPENDENT_AMBULATORY_CARE_PROVIDER_SITE_OTHER): Payer: BC Managed Care – PPO

## 2020-04-01 ENCOUNTER — Other Ambulatory Visit: Payer: Self-pay

## 2020-04-01 DIAGNOSIS — M5386 Other specified dorsopathies, lumbar region: Secondary | ICD-10-CM | POA: Diagnosis not present

## 2020-04-01 DIAGNOSIS — M9903 Segmental and somatic dysfunction of lumbar region: Secondary | ICD-10-CM | POA: Diagnosis not present

## 2020-04-01 DIAGNOSIS — J309 Allergic rhinitis, unspecified: Secondary | ICD-10-CM

## 2020-04-01 DIAGNOSIS — M9901 Segmental and somatic dysfunction of cervical region: Secondary | ICD-10-CM | POA: Diagnosis not present

## 2020-04-01 DIAGNOSIS — M7611 Psoas tendinitis, right hip: Secondary | ICD-10-CM | POA: Diagnosis not present

## 2020-04-01 DIAGNOSIS — M9902 Segmental and somatic dysfunction of thoracic region: Secondary | ICD-10-CM | POA: Diagnosis not present

## 2020-04-01 NOTE — Telephone Encounter (Signed)
Patient came in to get her allergy injections and informed me that the Airduo inhaler was approved with and will be $30. She asked for a copay card to also help.

## 2020-04-08 ENCOUNTER — Ambulatory Visit (INDEPENDENT_AMBULATORY_CARE_PROVIDER_SITE_OTHER): Payer: BC Managed Care – PPO

## 2020-04-08 ENCOUNTER — Other Ambulatory Visit: Payer: Self-pay

## 2020-04-08 DIAGNOSIS — J309 Allergic rhinitis, unspecified: Secondary | ICD-10-CM | POA: Diagnosis not present

## 2020-04-17 ENCOUNTER — Ambulatory Visit (INDEPENDENT_AMBULATORY_CARE_PROVIDER_SITE_OTHER): Payer: BC Managed Care – PPO

## 2020-04-17 ENCOUNTER — Other Ambulatory Visit: Payer: Self-pay

## 2020-04-17 DIAGNOSIS — J309 Allergic rhinitis, unspecified: Secondary | ICD-10-CM

## 2020-04-22 ENCOUNTER — Other Ambulatory Visit: Payer: Self-pay

## 2020-04-22 ENCOUNTER — Ambulatory Visit (INDEPENDENT_AMBULATORY_CARE_PROVIDER_SITE_OTHER): Payer: BC Managed Care – PPO

## 2020-04-22 DIAGNOSIS — J309 Allergic rhinitis, unspecified: Secondary | ICD-10-CM | POA: Diagnosis not present

## 2020-04-27 DIAGNOSIS — J45909 Unspecified asthma, uncomplicated: Secondary | ICD-10-CM | POA: Diagnosis not present

## 2020-04-27 DIAGNOSIS — K5732 Diverticulitis of large intestine without perforation or abscess without bleeding: Secondary | ICD-10-CM | POA: Diagnosis not present

## 2020-04-27 DIAGNOSIS — R197 Diarrhea, unspecified: Secondary | ICD-10-CM | POA: Diagnosis not present

## 2020-04-27 DIAGNOSIS — R1032 Left lower quadrant pain: Secondary | ICD-10-CM | POA: Diagnosis not present

## 2020-04-27 DIAGNOSIS — Z9049 Acquired absence of other specified parts of digestive tract: Secondary | ICD-10-CM | POA: Diagnosis not present

## 2020-04-27 DIAGNOSIS — Z881 Allergy status to other antibiotic agents status: Secondary | ICD-10-CM | POA: Diagnosis not present

## 2020-04-27 DIAGNOSIS — Z3202 Encounter for pregnancy test, result negative: Secondary | ICD-10-CM | POA: Diagnosis not present

## 2020-04-27 DIAGNOSIS — Z79899 Other long term (current) drug therapy: Secondary | ICD-10-CM | POA: Diagnosis not present

## 2020-04-27 DIAGNOSIS — K5733 Diverticulitis of large intestine without perforation or abscess with bleeding: Secondary | ICD-10-CM | POA: Diagnosis not present

## 2020-04-30 ENCOUNTER — Ambulatory Visit (INDEPENDENT_AMBULATORY_CARE_PROVIDER_SITE_OTHER): Payer: BC Managed Care – PPO

## 2020-04-30 ENCOUNTER — Ambulatory Visit (INDEPENDENT_AMBULATORY_CARE_PROVIDER_SITE_OTHER): Payer: BC Managed Care – PPO | Admitting: Osteopathic Medicine

## 2020-04-30 ENCOUNTER — Other Ambulatory Visit: Payer: Self-pay

## 2020-04-30 ENCOUNTER — Encounter: Payer: Self-pay | Admitting: Osteopathic Medicine

## 2020-04-30 VITALS — BP 109/72 | HR 78 | Temp 98.1°F | Wt 159.0 lb

## 2020-04-30 DIAGNOSIS — R1032 Left lower quadrant pain: Secondary | ICD-10-CM | POA: Diagnosis not present

## 2020-04-30 DIAGNOSIS — K5792 Diverticulitis of intestine, part unspecified, without perforation or abscess without bleeding: Secondary | ICD-10-CM | POA: Diagnosis not present

## 2020-04-30 DIAGNOSIS — R110 Nausea: Secondary | ICD-10-CM | POA: Diagnosis not present

## 2020-04-30 DIAGNOSIS — R319 Hematuria, unspecified: Secondary | ICD-10-CM

## 2020-04-30 DIAGNOSIS — R197 Diarrhea, unspecified: Secondary | ICD-10-CM

## 2020-04-30 DIAGNOSIS — K921 Melena: Secondary | ICD-10-CM | POA: Diagnosis not present

## 2020-04-30 MED ORDER — HYDROCODONE-ACETAMINOPHEN 10-325 MG PO TABS: 0.5000 | ORAL_TABLET | Freq: Four times a day (QID) | ORAL | 0 refills | Status: DC

## 2020-04-30 MED ORDER — KETOROLAC TROMETHAMINE 60 MG/2ML IM SOLN
60.0000 mg | Freq: Once | INTRAMUSCULAR | Status: AC
Start: 2020-04-30 — End: 2020-04-30
  Administered 2020-04-30: 60 mg via INTRAMUSCULAR

## 2020-04-30 MED ORDER — HYDROCODONE-ACETAMINOPHEN 10-325 MG PO TABS: 0.5000 | ORAL_TABLET | Freq: Four times a day (QID) | ORAL | 0 refills | Status: AC

## 2020-04-30 NOTE — Patient Instructions (Addendum)
DIVERTICULITIS: usually improves with treatment. There is danger of bowel perforation (infection eating through intestine) and abscess formation (pocket of pus in the abdomen that oral antibiotics may not treat) which can cause severe pain and may require surgery to correct. If feeling dizzy or feverish, if worsening pain, if persistent bloody stool, GO TO HOSPITAL!   Diet: Clear liquid --> Full liquid --> Bland diet, advance diet as tolerated as your pain improves. Drink plenty of fluids.   OK to take Ibuprofen 800 mg every 6-8 hours along with Tylenol 500 mg every 6-8 hours. I refilled pain medications at higher dose, can take 1/2 tablet (5 mg hydrocodone) or whole tablet (10 mg hydrocodone) every 6-8 hours.    While on pain medicine, there is risk of constipation. We want to be avoid using bowel stimulant laxatives as these can cause more pain/bloating, but MiraLax powder should be fine to take with liquid diet 17 g (~1 heaping tablespoon) dissolved in 120 to 240 mL (4 to 8 ounces) of beverage, once daily       Clear Liquid Diet, Adult A clear liquid diet is a diet that includes only liquids and semi-liquids that you can see through. You do not eat any food on this diet. Most people need to follow this diet for only a short period of time. You may need to follow a clear liquid diet if:  You develop a medical condition right before or after you have surgery.  You were not able to eat food for a long period of time.  You had a condition that gave you nausea, vomiting, or diarrhea.  You are going to have a procedure or exam to look at parts of your digestive system.  You are going to have bowel surgery. The usual goals of this diet are:  To rest the stomach and digestive system as much as possible.  To help you clear the digestive system before a procedure or exam.  To keep you hydrated.  To make sure you get some calories for energy.  To help you return to your normal eating  routine. What are tips for following this plan?  A clear liquid is a liquid or semi-liquid, such as gelatin, that you can see through when you hold it up to a light.  A clear liquid diet does not provide all the nutrients that you need. It is important to choose a variety of the liquids or semi-liquids that are allowed on this diet. That way, you will get as many nutrients as possible.  If you are not sure whether you can have certain items, ask your health care provider. If you have difficulty swallowing thin liquids, you will need to thicken them to prevent breathing in (aspiration). What foods should I eat?   Water and flavored water.  Fruit juices that do not have pulp, such as cranberry juice and apple juice.  Tea and coffee without milk or cream.  Clear bouillon or broth.  Broth-based soups that have been strained.  Flavored gelatin.  Honey.  Sugar water.  Ice or frozen ice pops that do not contain milk, yogurt, fruit pieces, or fruit pulp.  Clear sodas.  Clear sports drinks. The items listed above may not be a complete list of recommended foods and beverages. Contact a dietitian for more information. What food should I avoid?  Juices that have pulp.  Milk.  Cream or cream-based soups.  Yogurt.  Regular foods that are not clear liquids or semi-liquids. The  items listed above may not be a complete list of foods and beverages to avoid. Contact a dietitian for more information. Questions to ask your health care provider:  How long do I need to follow this diet?  Are there any medicines that I should change while on this diet? Summary  A clear liquid diet is a diet that includes liquids and semi-liquids that you can see through.  The goal of this diet is to help you recover by resting your digestive system, keeping you hydrated, and providing nutrients.  Avoid liquids with milk, cream, or pulp while on this diet. This information is not intended to replace  advice given to you by your health care provider. Make sure you discuss any questions you have with your health care provider. Document Revised: 12/05/2017 Document Reviewed: 12/05/2017 Elsevier Patient Education  2020 Elsevier Inc.    Full Liquid Diet A full liquid diet refers to fluids and foods that are liquid or will become liquid at room temperature. This diet should only be used for a short period of time to help you recover from illness or surgery. Your health care provider or dietitian will help you determine when it is safe to eat regular foods. What are tips for following this plan?     Reading food labels  Check food labels of nutrition shakes for the amount of protein. Look for nutrition shakes that have at least 8-10 grams of protein in each serving.  Look for drinks, such as milks and juices, that are "fortified" or "enriched." This means that vitamins and minerals have been added. Shopping  Buy premade nutritional shakes to keep on hand.  To vary your choices, buy different flavors of milks and shakes. Meal planning  Choose flavors and foods that you enjoy.  To make sure you get enough energy from food (calories): ? Eat 3 full liquid meals each day. Have a liquid snack between each meal. ? Drink 6-8 ounces (177-237 ml) of a nutrition supplement shake with meals or as snacks. ? Add protein powder, powdered milk, milk, or yogurt to shakes to increase the amount of protein.  Drink at least one serving a day of citrus fruit juice or fruit juice that has vitamin C added. General guidelines  Before starting the full-liquid diet, check with your health care provider to know what foods you should avoid. These may include full-fat or high-fiber liquids.  You may have any liquid or food that becomes a liquid at room temperature. The food is considered a liquid if it can be poured off a spoon at room temperature.  Do not drink alcohol unless approved by your health care  provider.  This diet gives you most of the nutrients that you need for energy, but you may not get enough of certain vitamins, minerals, and fiber. Make sure to talk to your health care provider or dietitian about: ? How many calories you need to eat get day. ? How much fluid you should have each day. ? Taking a multivitamin or a nutritional supplement. What foods are allowed? The items listed may not be a complete list. Talk with your dietitian about what dietary choices are best for you. Grains Thin hot cereal, such as cream of wheat. Soft-cooked pasta or rice pured in soup. Vegetables Pulp-free tomato or vegetable juice. Vegetables pured in soup. Fruits Fruit juice without pulp. Strained fruit pures (seeds and skins removed). Meats and other protein foods Beef, chicken, and fish broths. Powdered protein supplements. Dairy  Milk and milk-based beverages, including milk shakes and instant breakfast mixes. Smooth yogurt. Pured cottage cheese. Beverages Water. Coffee and tea (caffeinated or decaffeinated). Cocoa. Liquid nutritional supplements. Soft drinks. Nondairy milks, such as almond, coconut, rice, or soy milk. Fats and oils Melted margarine and butter. Cream. [place]ola, almond, avocado, corn, grapeseed, sunflower, and sesame oils. Gravy. Sweets and desserts Custard. Pudding. Flavored gelatin. Smooth ice cream (without nuts or candy pieces). Sherbet. Popsicles. [place] ice. Pudding pops. Seasoning and other foods Salt and pepper. Spices. Cocoa powder. Vinegar. Ketchup. Yellow mustard. Smooth sauces, such as [place], cheese sauce, or white sauce. Soy sauce. Cream soups. Strained soups. Syrup. Honey. Jelly (without fruit pieces). What foods are not allowed? The items listed may not be a complete list. Talk with your dietitian about what dietary choices are best for you. Grains Whole grains. Pasta. Rice. Cold cereal. Bread. Crackers. Vegetables All whole fresh, frozen, or canned  vegetables. Fruits All whole fresh, frozen, or canned fruits. Meats and other protein foods All cuts of meat, poultry, and fish. Eggs. Tofu and soy protein. Nuts and nut butters. Lunch meat. Sausage. Dairy Hard cheese. Yogurt with fruit chunks. Fats and oils Coconut oil. Palm oil. Lard. Cold butter. Sweets and desserts Ice cream or other frozen desserts that have any solids in them or on top, such as nuts, chocolate chips, and pieces of cookies. Cakes. Cookies. Candy. Seasoning and other foods Stone-ground mustards. Soups with chunks or pieces. Summary  A full liquid diet refers to fluids and foods that are liquid or will become liquid at room temperature.  This diet should only be used for a short period of time to help you recover from illness or surgery. Ask your health care provider or dietitian when it is safe for you to eat regular foods.  To make sure you get enough calories and nutrients, eat 3 meals each day with snacks between. Drink premade nutrition supplement shakes or add protein powder to homemade shakes. Take a vitamin and mineral supplement as told by your health care provider. This information is not intended to replace advice given to you by your health care provider. Make sure you discuss any questions you have with your health care provider. Document Revised: 09/10/2017 Document Reviewed: 07/28/2016 Elsevier Patient Education  2020 [employer].   [place] Diet A bland diet consists of foods that are often soft and do not have a lot of fat, fiber, or extra seasonings. Foods without fat, fiber, or seasoning are easier for the body to digest. They are also less likely to irritate your mouth, throat, stomach, and other parts of your digestive system. A bland diet is sometimes called a BRAT diet. What is my plan? Your health care provider or food and nutrition specialist (dietitian) may recommend specific changes to your diet to prevent symptoms or to treat your symptoms.  These changes may include:  Eating small meals often.  Cooking food until it is soft enough to chew easily.  Chewing your food well.  Drinking fluids slowly.  Not eating foods that are very spicy, sour, or fatty.  Not eating citrus fruits, such as oranges and grapefruit. What do I need to know about this diet?  Eat a variety of foods from the bland diet food list.  Do not follow a bland diet longer than needed.  Ask your health care provider whether you should take vitamins or supplements. What foods can I eat? Grains  Hot cereals, such as cream of wheat. Rice. Bread, crackers,  or tortillas made from refined white flour. Vegetables Canned or cooked vegetables. Mashed or boiled potatoes. Fruits  Bananas. Applesauce. Other types of cooked or canned fruit with the skin and seeds removed, such as canned peaches or pears. Meats and other proteins  Scrambled eggs. Creamy peanut butter or other nut butters. Lean, well-cooked meats, such as chicken or fish. Tofu. Soups or broths. Dairy Low-fat dairy products, such as milk, cottage cheese, or yogurt. Beverages  Water. Herbal tea. Apple juice. Fats and oils Mild salad dressings. [place]ola or olive oil. Sweets and desserts Pudding. Custard. Fruit gelatin. Ice cream. The items listed above may not be a complete list of recommended foods and beverages. Contact a dietitian for more options. What foods are not recommended? Grains Whole grain breads and cereals. Vegetables Raw vegetables. Fruits Raw fruits, especially citrus, berries, or dried fruits. Dairy Whole fat dairy foods. Beverages Caffeinated drinks. Alcohol. Seasonings and condiments Strongly flavored seasonings or condiments. Hot sauce. Salsa. Other foods Spicy foods. Fried foods. Sour foods, such as pickled or fermented foods. Foods with high sugar content. Foods high in fiber. The items listed above may not be a complete list of foods and beverages to avoid. Contact  a dietitian for more information. Summary  A bland diet consists of foods that are often soft and do not have a lot of fat, fiber, or extra seasonings.  Foods without fat, fiber, or seasoning are easier for the body to digest.  Check with your health care provider to see how long you should follow this diet plan. It is not meant to be followed for long periods. This information is not intended to replace advice given to you by your health care provider. Make sure you discuss any questions you have with your health care provider. Document Revised: 07/13/2017 Document Reviewed: 07/13/2017 Elsevier Patient Education  2020 [employer]

## 2020-04-30 NOTE — Progress Notes (Signed)
Barbara Sellers is a 29 y.o. female who presents to  [facility]  today, 04/30/20, seeking care for the following:  . ER follow-up - Diverticulitis     ASSESSMENT & PLAN with other pertinent findings:  The encounter diagnosis was Diverticulitis.   No results found for this or any previous visit (from the past 24 hour(s)).   04/27/20 ER records reviewed:  CC abdominal pain and bloody stool CT: "Distal predominant colonic diverticulosis with focal stranding about diverticulum at posterior aspect of distal descending colon with associated minimal fluid, compatible with diverticulitis. No associated features of frank macro perforation nor abscess formation at this time." GU and pelvic anatomy WNL.  WBC elevated to 14.2 CMP and UA ok  --> cefuroxime 500 mg bid x10 days + metronidazole 500 mg bid x7 days, Zofran, Norco   Today, pt reports significant abdominal pain and exhibits guarding on exam, BS hyperactive, tender diffusely, abdomen nondistended. KUB as below reassuring. VS stable. Pt reports significant improvement in pain w/ Toradol IM. See pt instructions, will reach out again in 2 days to recheck. Er precautions reviewed.      Patient Instructions  DIVERTICULITIS: usually improves with treatment. There is danger of bowel perforation (infection eating through intestine) and abscess formation (pocket of pus in the abdomen that oral antibiotics may not treat) which can cause severe pain and may require surgery to correct. If feeling dizzy or feverish, if worsening pain, if persistent bloody stool, GO TO HOSPITAL!   Diet: Clear liquid --> Full liquid --> Bland diet, advance diet as tolerated as your pain improves. Drink plenty of fluids.   OK to take Ibuprofen 800 mg every 6-8 hours along with Tylenol 500 mg every 6-8 hours. I refilled pain medications at higher dose, can take 1/2 tablet (5 mg hydrocodone) or whole tablet (10 mg  hydrocodone) every 6-8 hours.    While on pain medicine, there is risk of constipation. We want to be avoid using bowel stimulant laxatives as these can cause more pain/bloating, but MiraLax powder should be fine to take with liquid diet 17 g (~1 heaping tablespoon) dissolved in 120 to 240 mL (4 to 8 ounces) of beverage, once daily     Orders Placed This Encounter  Procedures  . DG Abd 2 Views   DG Abd 2 Views  Result Date: 04/30/2020 CLINICAL DATA:  Left lower quadrant abdominal pain. Nausea. Diarrhea. Hematuria. Bloody stools for 5 days. EXAM: ABDOMEN - 2 VIEW COMPARISON:  05/08/2013 CT abdomen/pelvis FINDINGS: No dilated small bowel loops or air-fluid levels. Mild colonic stool and gas. No evidence of pneumatosis or pneumoperitoneum. No radiopaque nephrolithiasis. Clear lung bases. IMPRESSION: Nonobstructive bowel gas pattern. No evidence of free intraperitoneal air. Electronically Signed   By: Delbert Phenix M.D.   On: 04/30/2020 12:04    No orders of the defined types were placed in this encounter.      Follow-up instructions: Return for will call on Friday to check in .                                         There were no vitals taken for this visit.  No outpatient medications have been marked as taking for the 04/30/20 encounter (Appointment) with Sunnie Nielsen, DO.    No results found for this or any previous visit (from the past  72 hour(s)).  No results found.     All questions at time of visit were answered - patient instructed to contact office with any additional concerns or updates.  ER/RTC precautions were reviewed with the patient as applicable.   Please note: voice recognition software was used to produce this document, and typos may escape review. Please contact Dr. Lyn Hollingshead for any needed clarifications.   Time spent 40 minutes including face to face time with paitnet, chart review, independent review/visualization  of imaging, follow-up phone call

## 2020-05-01 ENCOUNTER — Telehealth: Payer: Self-pay

## 2020-05-01 MED ORDER — FLUCONAZOLE 150 MG PO TABS: 150.0000 mg | ORAL_TABLET | Freq: Once | ORAL | 1 refills | Status: AC

## 2020-05-01 NOTE — Telephone Encounter (Signed)
Done! Can call pt and let them know it's there

## 2020-05-01 NOTE — Telephone Encounter (Signed)
Left pt msg advising RX sent in  

## 2020-05-01 NOTE — Telephone Encounter (Signed)
Pt left a vm msg requesting an antibiotic for yeast infection. Per pt, symptoms are caused by the current rxs. Pls send rx to [facility].

## 2020-05-06 ENCOUNTER — Ambulatory Visit (INDEPENDENT_AMBULATORY_CARE_PROVIDER_SITE_OTHER): Payer: BC Managed Care – PPO

## 2020-05-06 ENCOUNTER — Other Ambulatory Visit: Payer: Self-pay

## 2020-05-06 DIAGNOSIS — M7611 Psoas tendinitis, right hip: Secondary | ICD-10-CM | POA: Diagnosis not present

## 2020-05-06 DIAGNOSIS — M5386 Other specified dorsopathies, lumbar region: Secondary | ICD-10-CM | POA: Diagnosis not present

## 2020-05-06 DIAGNOSIS — J309 Allergic rhinitis, unspecified: Secondary | ICD-10-CM

## 2020-05-06 DIAGNOSIS — M9901 Segmental and somatic dysfunction of cervical region: Secondary | ICD-10-CM | POA: Diagnosis not present

## 2020-05-06 DIAGNOSIS — M9903 Segmental and somatic dysfunction of lumbar region: Secondary | ICD-10-CM | POA: Diagnosis not present

## 2020-05-06 DIAGNOSIS — M9902 Segmental and somatic dysfunction of thoracic region: Secondary | ICD-10-CM | POA: Diagnosis not present

## 2020-05-13 ENCOUNTER — Ambulatory Visit (INDEPENDENT_AMBULATORY_CARE_PROVIDER_SITE_OTHER): Payer: BC Managed Care – PPO

## 2020-05-13 ENCOUNTER — Other Ambulatory Visit: Payer: Self-pay

## 2020-05-13 DIAGNOSIS — J309 Allergic rhinitis, unspecified: Secondary | ICD-10-CM | POA: Diagnosis not present

## 2020-05-14 DIAGNOSIS — F4310 Post-traumatic stress disorder, unspecified: Secondary | ICD-10-CM | POA: Diagnosis not present

## 2020-05-29 ENCOUNTER — Other Ambulatory Visit: Payer: Self-pay

## 2020-05-29 ENCOUNTER — Ambulatory Visit (INDEPENDENT_AMBULATORY_CARE_PROVIDER_SITE_OTHER): Payer: BC Managed Care – PPO

## 2020-05-29 DIAGNOSIS — J309 Allergic rhinitis, unspecified: Secondary | ICD-10-CM

## 2020-06-05 ENCOUNTER — Other Ambulatory Visit: Payer: Self-pay

## 2020-06-05 ENCOUNTER — Ambulatory Visit (INDEPENDENT_AMBULATORY_CARE_PROVIDER_SITE_OTHER): Payer: BC Managed Care – PPO

## 2020-06-05 DIAGNOSIS — J309 Allergic rhinitis, unspecified: Secondary | ICD-10-CM

## 2020-06-17 ENCOUNTER — Ambulatory Visit (INDEPENDENT_AMBULATORY_CARE_PROVIDER_SITE_OTHER): Payer: BC Managed Care – PPO

## 2020-06-17 ENCOUNTER — Other Ambulatory Visit: Payer: Self-pay

## 2020-06-17 DIAGNOSIS — J309 Allergic rhinitis, unspecified: Secondary | ICD-10-CM

## 2020-06-26 ENCOUNTER — Ambulatory Visit (INDEPENDENT_AMBULATORY_CARE_PROVIDER_SITE_OTHER): Payer: BC Managed Care – PPO

## 2020-06-26 DIAGNOSIS — J309 Allergic rhinitis, unspecified: Secondary | ICD-10-CM

## 2020-07-03 ENCOUNTER — Other Ambulatory Visit: Payer: Self-pay

## 2020-07-03 ENCOUNTER — Ambulatory Visit (INDEPENDENT_AMBULATORY_CARE_PROVIDER_SITE_OTHER): Payer: Managed Care, Other (non HMO)

## 2020-07-03 DIAGNOSIS — J309 Allergic rhinitis, unspecified: Secondary | ICD-10-CM

## 2020-07-11 ENCOUNTER — Encounter: Payer: Self-pay | Admitting: Physician Assistant

## 2020-07-11 ENCOUNTER — Telehealth (INDEPENDENT_AMBULATORY_CARE_PROVIDER_SITE_OTHER): Payer: Managed Care, Other (non HMO) | Admitting: Physician Assistant

## 2020-07-11 ENCOUNTER — Other Ambulatory Visit: Payer: Self-pay

## 2020-07-11 DIAGNOSIS — J329 Chronic sinusitis, unspecified: Secondary | ICD-10-CM

## 2020-07-11 DIAGNOSIS — J40 Bronchitis, not specified as acute or chronic: Secondary | ICD-10-CM

## 2020-07-11 MED ORDER — PREDNISONE 50 MG PO TABS: ORAL_TABLET | ORAL | 0 refills | Status: DC

## 2020-07-11 MED ORDER — AMOXICILLIN-POT CLAVULANATE 875-125 MG PO TABS: 1.0000 | ORAL_TABLET | Freq: Two times a day (BID) | ORAL | 0 refills | Status: AC

## 2020-07-11 NOTE — Progress Notes (Signed)
Patient ID: Barbara Sellers, female   DOB: Mar 08, 1991, 30 y.o.   MRN: [id number] .Virtual Visit via Telephone Note  I connected with Sanela Evola on 07/11/20 at  9:50 AM EST by telephone and verified that I am speaking with the correct person using two identifiers.  Location: Patient: home Provider: clinic  .Participating in visit:  Patient: [name] Provider: Tandy Gaw PA-C     I discussed the limitations, risks, security and privacy concerns of performing an evaluation and management service by telephone and the availability of in person appointments. I also discussed with the patient that there may be a patient responsible charge related to this service. The patient expressed understanding and agreed to proceed.   History of Present Illness: Patient is a 30 year old female with asthma who calls into the clinic with approaching 2 weeks of sinus pressure, sinus congestion, cough, chest tightness.  She is tested for COVID twice and been negative.  She has been taking over-the-counter Mucinex and cough preparations.  She has been using her albuterol inhaler regularly.  All the things help but has not allowed her to clear the infection.  She is feeling like is going more more into her chest, she denies any fever chills or body aches.    .. Active Ambulatory Problems    Diagnosis Date Noted  . Closed fracture of distal end of left radius 11/11/2014  . Cubital tunnel syndrome, left 07/19/2018  . Fracture of metacarpal base of left hand, closed 11/15/2014  . S/P hardware removal 10/09/2015  . Ocular migraine 10/23/2019  . Family history of melanoma   . Family history of breast cancer   . Family history of pancreatic cancer   . Family history of prostate cancer   . Reactive airway disease 11/29/2019  . Adverse food reaction 11/29/2019  . History of frequent upper respiratory infection 11/29/2019  . Drug reaction 11/29/2019  . Genetic testing 12/10/2019  . Seasonal and perennial  allergic rhinoconjunctivitis 03/04/2020   Resolved Ambulatory Problems    Diagnosis Date Noted  . Other allergic rhinitis 11/29/2019  . Allergic conjunctivitis of both eyes 11/29/2019   Past Medical History:  Diagnosis Date  . ADHD (attention deficit hyperactivity disorder)   . Asthma   . Depression   . Eczema   . Migraines   . PTSD (post-traumatic stress disorder)   . Recurrent upper respiratory infection (URI)   . Rheumatoid arthritis (HCC)   . Seasonal allergies   . Swine flu 2009  . Urticaria   . Vertigo    Reviewed for med, allergy, problem list.     Observations/Objective: No acute distress Productive cough on exam No labored breathing or wheezing  .Marland KitchenThere were no vitals filed for this visit. There is no height or weight on file to calculate BMI.     Assessment and Plan: Marland KitchenMarland KitchenDiagnoses and all orders for this visit:  Sinobronchitis -     predniSONE (DELTASONE) 50 MG tablet; One tab PO daily for 5 days. -     amoxicillin-clavulanate (AUGMENTIN) 875-125 MG tablet; Take 1 tablet by mouth 2 (two) times daily for 10 days.   Patient does have known asthma.  Continue using her albuterol inhaler every 4 hours as needed for cough and shortness of breath.  I did add prednisone for 5 days.  I did choose Augmentin for her sinobronchitis.  She is allergic to doxycycline and azithromycin.  She has had 2 negative COVID vaccines.   Follow Up Instructions:    I discussed  the assessment and treatment plan with the patient. The patient was provided an opportunity to ask questions and all were answered. The patient agreed with the plan and demonstrated an understanding of the instructions.   The patient was advised to call back or seek an in-person evaluation if the symptoms worsen or if the condition fails to improve as anticipated.  I provided 10 minutes of non-face-to-face time during this encounter.   Tandy Gaw, PA-C

## 2020-07-11 NOTE — Progress Notes (Signed)
Sinus inf x 2-3 weeks. COVID test results today were negative. Blood clots from nose, red to yellow drainage. Denies any fever, nausea, vomiting, diarrhea.

## 2020-07-22 ENCOUNTER — Ambulatory Visit (INDEPENDENT_AMBULATORY_CARE_PROVIDER_SITE_OTHER): Payer: Managed Care, Other (non HMO)

## 2020-07-22 ENCOUNTER — Telehealth: Payer: Self-pay

## 2020-07-22 ENCOUNTER — Other Ambulatory Visit: Payer: Self-pay

## 2020-07-22 DIAGNOSIS — J309 Allergic rhinitis, unspecified: Secondary | ICD-10-CM | POA: Diagnosis not present

## 2020-07-22 MED ORDER — FLUTICASONE FUROATE-VILANTEROL 100-25 MCG/INH IN AEPB: 1.0000 | INHALATION_SPRAY | Freq: Every day | RESPIRATORY_TRACT | 2 refills | Status: DC

## 2020-07-22 NOTE — Telephone Encounter (Signed)
Okay sent in breo 1 puff daily. Rinse mouth after each use.  Stop Airduo.  Follow up in 2 months.

## 2020-07-22 NOTE — Telephone Encounter (Signed)
Patient came into clinic to get her allergy injections. Patient stated that she believes her Airduo 113/14 mcg is causing her to cough for a while after taking it and doesn't feel like it is working as well as it did at first. Patient is requesting to go back to Breo 100 to see if it helps more. Please advise.

## 2020-07-23 NOTE — Telephone Encounter (Signed)
Left detail message for patient to continue Breo.

## 2020-07-31 ENCOUNTER — Ambulatory Visit (INDEPENDENT_AMBULATORY_CARE_PROVIDER_SITE_OTHER): Payer: Managed Care, Other (non HMO)

## 2020-07-31 ENCOUNTER — Other Ambulatory Visit: Payer: Self-pay

## 2020-07-31 DIAGNOSIS — J309 Allergic rhinitis, unspecified: Secondary | ICD-10-CM

## 2020-08-14 ENCOUNTER — Ambulatory Visit (INDEPENDENT_AMBULATORY_CARE_PROVIDER_SITE_OTHER): Payer: Managed Care, Other (non HMO)

## 2020-08-14 ENCOUNTER — Other Ambulatory Visit: Payer: Self-pay

## 2020-08-14 DIAGNOSIS — J309 Allergic rhinitis, unspecified: Secondary | ICD-10-CM

## 2020-08-21 ENCOUNTER — Other Ambulatory Visit: Payer: Self-pay

## 2020-08-21 ENCOUNTER — Ambulatory Visit (INDEPENDENT_AMBULATORY_CARE_PROVIDER_SITE_OTHER): Payer: Managed Care, Other (non HMO)

## 2020-08-21 DIAGNOSIS — J309 Allergic rhinitis, unspecified: Secondary | ICD-10-CM | POA: Diagnosis not present

## 2020-08-28 ENCOUNTER — Other Ambulatory Visit: Payer: Self-pay

## 2020-08-28 ENCOUNTER — Ambulatory Visit (INDEPENDENT_AMBULATORY_CARE_PROVIDER_SITE_OTHER): Payer: Managed Care, Other (non HMO)

## 2020-08-28 DIAGNOSIS — J309 Allergic rhinitis, unspecified: Secondary | ICD-10-CM

## 2020-09-04 ENCOUNTER — Other Ambulatory Visit: Payer: Self-pay

## 2020-09-04 ENCOUNTER — Ambulatory Visit (INDEPENDENT_AMBULATORY_CARE_PROVIDER_SITE_OTHER): Payer: Managed Care, Other (non HMO)

## 2020-09-04 DIAGNOSIS — J309 Allergic rhinitis, unspecified: Secondary | ICD-10-CM | POA: Diagnosis not present

## 2020-09-09 ENCOUNTER — Ambulatory Visit (INDEPENDENT_AMBULATORY_CARE_PROVIDER_SITE_OTHER): Payer: Managed Care, Other (non HMO) | Admitting: Certified Nurse Midwife

## 2020-09-09 ENCOUNTER — Encounter: Payer: Self-pay | Admitting: Certified Nurse Midwife

## 2020-09-09 ENCOUNTER — Other Ambulatory Visit: Payer: Self-pay

## 2020-09-09 ENCOUNTER — Encounter: Payer: Managed Care, Other (non HMO) | Admitting: Certified Nurse Midwife

## 2020-09-09 ENCOUNTER — Other Ambulatory Visit (HOSPITAL_COMMUNITY)
Admission: RE | Admit: 2020-09-09 | Discharge: 2020-09-09 | Disposition: A | Discharge status: Home / Self Care | Payer: Managed Care, Other (non HMO) | Source: Ambulatory Visit | Attending: Certified Nurse Midwife | Admitting: Certified Nurse Midwife

## 2020-09-09 VITALS — BP 124/78 | HR 70 | Ht 61.0 in | Wt 162.0 lb

## 2020-09-09 DIAGNOSIS — Z01419 Encounter for gynecological examination (general) (routine) without abnormal findings: Secondary | ICD-10-CM | POA: Diagnosis present

## 2020-09-09 DIAGNOSIS — Z30011 Encounter for initial prescription of contraceptive pills: Secondary | ICD-10-CM | POA: Diagnosis not present

## 2020-09-09 MED ORDER — DROSPIRENONE-ETHINYL ESTRADIOL 3-0.02 MG PO TABS: 1.0000 | ORAL_TABLET | Freq: Every day | ORAL | 3 refills | Status: DC

## 2020-09-09 NOTE — Progress Notes (Signed)
GYNECOLOGY ANNUAL PREVENTATIVE CARE ENCOUNTER NOTE  History:     Barbara Sellers is a 30 y.o. G0P0 female here for a new patient gynecologic exam.  Current complaints: none.   Denies abnormal vaginal bleeding, discharge, pelvic pain, problems with intercourse or other gynecologic concerns.    Gynecologic History Patient's last menstrual period was 09/07/2020. Contraception: OCP (estrogen/progesterone)- patient needs refill   Obstetric History OB History  Gravida Para Term Preterm AB Living  0            SAB IAB Ectopic Multiple Live Births               Past Medical History:  Diagnosis Date  . ADHD (attention deficit hyperactivity disorder)   . Asthma   . Depression   . Eczema   . Family history of breast cancer   . Family history of melanoma   . Family history of pancreatic cancer   . Family history of prostate cancer   . Migraines   . Ocular migraine 10/23/2019  . PTSD (post-traumatic stress disorder)   . Recurrent upper respiratory infection (URI)   . Rheumatoid arthritis (HCC)   . Seasonal allergies   . Swine flu 2009  . Urticaria   . Vertigo     Past Surgical History:  Procedure Laterality Date  . APPENDECTOMY  1996  . ORIF DISTAL RADIUS FRACTURE    . WISDOM TOOTH EXTRACTION      Current Outpatient Medications on File Prior to Visit  Medication Sig Dispense Refill  . albuterol (PROVENTIL HFA;VENTOLIN HFA) 108 (90 Base) MCG/ACT inhaler Inhale into the lungs every 6 (six) hours as needed for wheezing or shortness of breath.     . Drospirenone-Ethinyl Estradiol (NIKKI PO) Take by mouth.    . EPINEPHrine 0.1 MG/0.1ML SOAJ Inject as directed.    . fluticasone furoate-vilanterol (BREO ELLIPTA) 100-25 MCG/INH AEPB Inhale 1 puff into the lungs daily. Rinse mouth after each use. 60 each 2  . Multiple Vitamins-Minerals (MULTIVITAMIN ADULTS PO) Take by mouth.    . pantoprazole (PROTONIX) 40 MG tablet Take 40 mg by mouth daily.    . prazosin (MINIPRESS) 1 MG capsule  Take by mouth at bedtime as needed.    . predniSONE (DELTASONE) 50 MG tablet One tab PO daily for 5 days. 5 tablet 0  . sertraline (ZOLOFT) 100 MG tablet Take 150 mg by mouth daily.     . traZODone (DESYREL) 50 MG tablet Take 100 mg by mouth at bedtime.      No current facility-administered medications on file prior to visit.    Allergies  Allergen Reactions  . Cayenne Anaphylaxis  . Azithromycin Itching and Rash    Also burning.   Also burning. Also burning.  . Doxycycline Other (See Comments) and Swelling    Esophagus issues. "felt like razor blades" Throat swelling  Other reaction(s): Other (See Comments) Throat swelling Esophagus issues. "felt like razor blades" Esophagus issues. "felt like razor blades" Throat swelling  . Doxycycline Calcium Other (See Comments)  . Other Other (See Comments)  . Buspar [Buspirone] Anxiety    Rx caused muscle spasms    Social History:  reports that she has quit smoking. Her smoking use included cigarettes. She has never used smokeless tobacco. She reports current alcohol use of about 2.0 standard drinks of alcohol per week. She reports that she does not use drugs.  Family History  Problem Relation Age of Onset  . Macular degeneration Mother   .  Urticaria Mother   . Colon polyps Father   . Diabetes Father   . Diverticulitis Father   . Cancer Father   . Cancer - Other Father        Adenocarcinoma  . Allergic rhinitis Father   . Asthma Father   . Eczema Father   . Allergic rhinitis Brother   . Arrhythmia Brother        half-brother  . Allergic rhinitis Brother   . Diabetes Maternal Aunt   . Allergic rhinitis Maternal Aunt   . Cancer Maternal Grandmother        SKIN  . Cancer Maternal Grandfather        SKIN  . Hypertension Paternal Grandmother   . Skin cancer Paternal Grandmother   . Allergic rhinitis Paternal Grandmother   . Asthma Paternal Grandmother   . Hypertension Paternal Grandfather   . Cancer Paternal Grandfather         melanoma, kidney, bladder, prostate, lung, liver cancer  . Skin cancer Paternal Grandfather   . Melanoma Paternal Aunt   . Allergic rhinitis Paternal Aunt   . Asthma Paternal Aunt   . Pancreatic cancer Other   . Breast cancer Maternal Aunt 59  . Allergic rhinitis Maternal Aunt   . Pancreatic cancer Maternal Aunt   . Allergic rhinitis Maternal Aunt   . Allergic rhinitis Maternal Aunt   . Asthma Maternal Aunt   . Melanoma Paternal Aunt   . Allergic rhinitis Paternal Aunt   . Asthma Paternal Aunt   . Melanoma Other     The following portions of the patient's history were reviewed and updated as appropriate: allergies, current medications, past family history, past medical history, past social history, past surgical history and problem list.  Review of Systems Pertinent items noted in HPI and remainder of comprehensive ROS otherwise negative.  Physical Exam:  BP 124/78   Pulse 70   Ht 5\' 1"  (1.549 m)   Wt 162 lb (73.5 kg)   LMP 09/07/2020   BMI 30.61 kg/m  CONSTITUTIONAL: Well-developed, well-nourished female in no acute distress.  HENT:  Normocephalic, atraumatic, External right and left ear normal.  EYES: Conjunctivae and EOM are normal. Pupils are equal, round, and reactive to light.   NECK: Normal range of motion, supple, no masses.  Normal thyroid.  SKIN: Skin is warm and dry. No rash noted. Not diaphoretic. No erythema. No pallor. MUSCULOSKELETAL: Normal range of motion. No tenderness.  No cyanosis, clubbing, or edema. NEUROLOGIC: Alert and oriented to person, place, and time. Normal reflexes, muscle tone coordination.  PSYCHIATRIC: Normal mood and affect. Normal behavior. Normal judgment and thought content. CARDIOVASCULAR: Normal heart rate noted, regular rhythm RESPIRATORY: Clear to auscultation bilaterally. Effort and breath sounds normal, no problems with respiration noted. ABDOMEN: Soft, no distention noted.  No tenderness, rebound or guarding.  PELVIC:  Normal appearing external genitalia and urethral meatus; normal appearing vaginal mucosa and cervix.  No abnormal discharge noted.  Pap smear obtained.  Normal uterine size, no other palpable masses, no uterine or adnexal tenderness.  Performed in the presence of a chaperone.   Assessment and Plan:    1. Women's annual routine gynecological examination - normal well woman examination  - Cytology - PAP( [place])  2. Encounter for initial prescription of contraceptive pills - patient reports needing refill on current OCPs  - drospirenone-ethinyl estradiol (NIKKI) 3-0.02 MG tablet; Take 1 tablet by mouth daily.  Dispense: 90 tablet; Refill: 3  Will follow up results  of pap smear and manage accordingly. Routine preventative health maintenance measures emphasized. Please refer to After Visit Summary for other counseling recommendations.      Sharyon Cable, CNM [facility] [employer], [facility]

## 2020-09-11 LAB — CYTOLOGY - PAP
Comment: NEGATIVE
Diagnosis: NEGATIVE
Diagnosis: REACTIVE
High risk HPV: NEGATIVE

## 2020-09-16 ENCOUNTER — Encounter: Payer: Managed Care, Other (non HMO) | Admitting: Advanced Practice Midwife

## 2020-09-18 ENCOUNTER — Other Ambulatory Visit: Payer: Self-pay

## 2020-09-18 ENCOUNTER — Ambulatory Visit (INDEPENDENT_AMBULATORY_CARE_PROVIDER_SITE_OTHER): Payer: Managed Care, Other (non HMO)

## 2020-09-18 DIAGNOSIS — J309 Allergic rhinitis, unspecified: Secondary | ICD-10-CM

## 2020-09-23 ENCOUNTER — Emergency Department
Admission: EM | Admit: 2020-09-23 | Discharge: 2020-09-23 | Disposition: A | Discharge status: Home / Self Care | Payer: Managed Care, Other (non HMO) | Source: Home / Self Care | Attending: Family Medicine | Admitting: Family Medicine

## 2020-09-23 ENCOUNTER — Emergency Department (INDEPENDENT_AMBULATORY_CARE_PROVIDER_SITE_OTHER): Payer: Managed Care, Other (non HMO)

## 2020-09-23 ENCOUNTER — Encounter: Payer: Self-pay | Admitting: Emergency Medicine

## 2020-09-23 ENCOUNTER — Other Ambulatory Visit: Payer: Self-pay

## 2020-09-23 DIAGNOSIS — M25511 Pain in right shoulder: Secondary | ICD-10-CM | POA: Diagnosis not present

## 2020-09-23 DIAGNOSIS — S43401A Unspecified sprain of right shoulder joint, initial encounter: Secondary | ICD-10-CM

## 2020-09-23 MED ORDER — HYDROCODONE-ACETAMINOPHEN 5-325 MG PO TABS: 1.0000 | ORAL_TABLET | Freq: Four times a day (QID) | ORAL | 0 refills | Status: DC | PRN

## 2020-09-23 NOTE — ED Triage Notes (Signed)
Pain to R shoulder since last night after throwing a football  History of dislocated shoulders Limited ROM to R arm - unable to lift arm past waist  Ibuprofen 800mg  at 0800 COVID 04/2019 & 06/2019 COVID vaccine & booster 06/01/20

## 2020-09-23 NOTE — ED Provider Notes (Signed)
[facility]    CSN: [id number] Arrival date & time: 09/23/20  1541      History   Chief Complaint Chief Complaint  Patient presents with  . Shoulder Pain    right    HPI Flara Storti is a 30 y.o. female.   After working out yesterday, patient finished by throwing a football and experienced sudden pain/popping in her right shoulder.  The pain has persisted, and she is unable to abduct her right shoulder.  She has a past history of shoulder dislocation.  The history is provided by the patient.  Shoulder Pain Location:  Shoulder Shoulder location:  R shoulder Injury: yes   Time since incident:  20 hours Mechanism of injury comment:  Throwing football Pain details:    Quality:  Aching and sharp   Radiates to:  Does not radiate   Severity:  Severe   Onset quality:  Sudden   Duration:  20 hours   Timing:  Constant   Progression:  Unchanged Handedness:  Right-handed Prior injury to area:  Yes Relieved by:  Nothing Worsened by:  Movement Ineffective treatments:  Ice and NSAIDs Associated symptoms: decreased range of motion and muscle weakness   Associated symptoms: no fatigue, no fever, no numbness and no tingling     Past Medical History:  Diagnosis Date  . ADHD (attention deficit hyperactivity disorder)   . Asthma   . Depression   . Eczema   . Family history of breast cancer   . Family history of melanoma   . Family history of pancreatic cancer   . Family history of prostate cancer   . Migraines   . Ocular migraine 10/23/2019  . PTSD (post-traumatic stress disorder)   . Recurrent upper respiratory infection (URI)   . Rheumatoid arthritis (HCC)   . Seasonal allergies   . Swine flu 2009  . Urticaria   . Vertigo     Patient Active Problem List   Diagnosis Date Noted  . Seasonal and perennial allergic rhinoconjunctivitis 03/04/2020  . Genetic testing 12/10/2019  . Reactive airway disease 11/29/2019  . Adverse food reaction 11/29/2019  . History  of frequent upper respiratory infection 11/29/2019  . Drug reaction 11/29/2019  . Family history of melanoma   . Family history of breast cancer   . Family history of pancreatic cancer   . Family history of prostate cancer   . Ocular migraine 10/23/2019  . Cubital tunnel syndrome, left 07/19/2018  . S/P hardware removal 10/09/2015  . Fracture of metacarpal base of left hand, closed 11/15/2014  . Closed fracture of distal end of left radius 11/11/2014    Past Surgical History:  Procedure Laterality Date  . APPENDECTOMY  1996  . ORIF DISTAL RADIUS FRACTURE    . WISDOM TOOTH EXTRACTION      OB History    Gravida  0   Para      Term      Preterm      AB      Living        SAB      IAB      Ectopic      Multiple      Live Births               Home Medications    Prior to Admission medications   Medication Sig Start Date End Date Taking? Authorizing Provider  drospirenone-ethinyl estradiol (NIKKI) 3-0.02 MG tablet Take 1 tablet by mouth daily. 09/09/20  Yes Steward Droneogers, Veronica C, CNM  fluticasone furoate-vilanterol (BREO ELLIPTA) 100-25 MCG/INH AEPB Inhale 1 puff into the lungs daily. Rinse mouth after each use. 07/22/20  Yes Ellamae SiaKim, Yoon M, DO  HYDROcodone-acetaminophen (NORCO/VICODIN) 5-325 MG tablet Take 1 tablet by mouth every 6 (six) hours as needed for moderate pain or severe pain. 09/23/20  Yes Lattie HawBeese, Bryn Saline A, MD  pantoprazole (PROTONIX) 40 MG tablet Take 40 mg by mouth daily.   Yes [provider]  prazosin (MINIPRESS) 1 MG capsule Take by mouth at bedtime as needed. 09/01/19  Yes [provider]  sertraline (ZOLOFT) 100 MG tablet Take 150 mg by mouth daily.  09/01/19  Yes [provider]  traZODone (DESYREL) 50 MG tablet Take 100 mg by mouth at bedtime.  10/04/19  Yes [provider]  albuterol (PROVENTIL HFA;VENTOLIN HFA) 108 (90 Base) MCG/ACT inhaler Inhale into the lungs every 6 (six) hours as needed for wheezing or shortness  of breath.     [provider]  buPROPion (WELLBUTRIN XL) 150 MG 24 hr tablet Take by mouth. 09/04/20   [provider]  Drospirenone-Ethinyl Estradiol (NIKKI PO) Take by mouth.    [provider]  EPINEPHrine 0.1 MG/0.1ML SOAJ Inject as directed.    [provider]  Multiple Vitamins-Minerals (MULTIVITAMIN ADULTS PO) Take by mouth. Patient not taking: Reported on 09/23/2020    [provider]  predniSONE (DELTASONE) 50 MG tablet One tab PO daily for 5 days. Patient not taking: Reported on 09/23/2020 07/11/20   Jomarie LongsBreeback, Jade L, PA-C    Family History Family History  Problem Relation Age of Onset  . Macular degeneration Mother   . Urticaria Mother   . Colon polyps Father   . Diabetes Father   . Diverticulitis Father   . Cancer Father   . Cancer - Other Father        Adenocarcinoma  . Allergic rhinitis Father   . Asthma Father   . Eczema Father   . Allergic rhinitis Brother   . Arrhythmia Brother        half-brother  . Allergic rhinitis Brother   . Diabetes Maternal Aunt   . Allergic rhinitis Maternal Aunt   . Cancer Maternal Grandmother        SKIN  . Cancer Maternal Grandfather        SKIN  . Hypertension Paternal Grandmother   . Skin cancer Paternal Grandmother   . Allergic rhinitis Paternal Grandmother   . Asthma Paternal Grandmother   . Hypertension Paternal Grandfather   . Cancer Paternal Grandfather        melanoma, kidney, bladder, prostate, lung, liver cancer  . Skin cancer Paternal Grandfather   . Melanoma Paternal Aunt   . Allergic rhinitis Paternal Aunt   . Asthma Paternal Aunt   . Pancreatic cancer Other   . Breast cancer Maternal Aunt 59  . Allergic rhinitis Maternal Aunt   . Pancreatic cancer Maternal Aunt   . Allergic rhinitis Maternal Aunt   . Allergic rhinitis Maternal Aunt   . Asthma Maternal Aunt   . Melanoma Paternal Aunt   . Allergic rhinitis Paternal Aunt   . Asthma Paternal Aunt   . Melanoma Other      Social History Social History   Tobacco Use  . Smoking status: Former Smoker    Types: Cigarettes  . Smokeless tobacco: Never Used  Vaping Use  . Vaping Use: Never used  Substance Use Topics  . Alcohol use: Yes  Alcohol/week: 2.0 standard drinks    Types: 2 Standard drinks or equivalent per week    Comment: rare  . Drug use: Never     Allergies   Cayenne, Azithromycin, Doxycycline, Doxycycline calcium, Other, and Buspar [buspirone]   Review of Systems Review of Systems  Constitutional: Positive for activity change. Negative for chills, diaphoresis, fatigue and fever.  Musculoskeletal:       Right shoulder pain  Neurological: Negative for numbness.  All other systems reviewed and are negative.    Physical Exam Triage Vital Signs ED Triage Vitals  Enc Vitals Group     BP 09/23/20 1610 117/76     Pulse Rate 09/23/20 1610 76     Resp 09/23/20 1610 15     Temp 09/23/20 1610 99.5 F (37.5 C)     Temp Source 09/23/20 1610 Oral     SpO2 09/23/20 1610 98 %     Weight 09/23/20 1611 152 lb 9.6 oz (69.2 kg)     Height 09/23/20 1611 5\' 1"  (1.549 m)     Head Circumference --      Peak Flow --      Pain Score 09/23/20 1608 4     Pain Loc --      Pain Edu? --      Excl. in GC? --    No data found.  Updated Vital Signs BP 117/76 (BP Location: Left Arm)   Pulse 76   Temp 99.5 F (37.5 C) (Oral)   Resp 15   Ht 5\' 1"  (1.549 m)   Wt 69.2 kg   LMP 09/07/2020 (Exact Date)   SpO2 98%   BMI 28.83 kg/m   Visual Acuity Right Eye Distance:   Left Eye Distance:   Bilateral Distance:    Right Eye Near:   Left Eye Near:    Bilateral Near:     Physical Exam Vitals and nursing note reviewed.  Constitutional:      General: She is not in acute distress. HENT:     Head: Atraumatic.  Eyes:     Pupils: Pupils are equal, round, and reactive to light.  Cardiovascular:     Rate and Rhythm: Normal rate.  Pulmonary:     Effort: Pulmonary effort is normal.   Musculoskeletal:     Right shoulder: Tenderness and bony tenderness present. No swelling, deformity or crepitus. Decreased range of motion. Decreased strength. Normal pulse.     Cervical back: Normal range of motion. No tenderness.     Comments: Right shoulder has decreased internal/external rotation.  The patient cannot actively abduct her shoulder more than about 20 degrees from her waist.  There is tenderness over the insertion of biceps tendon, mid-clavicle although no swelling present over clavicle.  Patient unable to perform Apley's and empty can test.  Distal neurovascular function is intact.    Skin:    General: Skin is warm and dry.  Neurological:     Mental Status: She is alert and oriented to person, place, and time.      UC Treatments / Results  Labs (all labs ordered are listed, but only abnormal results are displayed) Labs Reviewed - No data to display  EKG   Radiology DG Shoulder Right  Result Date: 09/23/2020 CLINICAL DATA:  Right shoulder pain and limited range of motion. Patient was throwing football and felt a pop in her right shoulder EXAM: RIGHT SHOULDER - 2+ VIEW COMPARISON:  None. FINDINGS: There is no evidence of fracture or dislocation.  There is no evidence of arthropathy or other focal bone abnormality. Soft tissues are unremarkable. IMPRESSION: Negative. Electronically Signed   By: Duanne Guess D.O.   On: 09/23/2020 17:00    Procedures Procedures (including critical care time)  Medications Ordered in UC Medications - No data to display  Initial Impression / Assessment and Plan / UC Course  I have reviewed the triage vital signs and the nursing notes.  Pertinent labs & imaging results that were available during my care of the patient were reviewed by me and considered in my medical decision making (see chart for details).    Suspect rotator cuff injury.  Dispensed sling. Rx for Vicodin (#12, no refill). Controlled Substance Prescriptions I  have consulted the [place] Controlled Substances Registry for this patient, and feel the risk/benefit ratio today is favorable for proceeding with this prescription for a controlled substance.   Followup with Dr. Rodney Langton ([facility]) if not improving one week.   Final Clinical Impressions(s) / UC Diagnoses   Final diagnoses:  Sprain of right shoulder, unspecified shoulder sprain type, initial encounter     Discharge Instructions     Wear sling.  Apply ice pack for 20 to 30 minutes, 3 to 4 times daily  Continue until pain and swelling decrease.  May take Ibuprofen 200mg , 4 tabs every 8 hours with food.  Begin pendulum exercises as tolerated.    ED Prescriptions    Medication Sig Dispense Auth. Provider   HYDROcodone-acetaminophen (NORCO/VICODIN) 5-325 MG tablet Take 1 tablet by mouth every 6 (six) hours as needed for moderate pain or severe pain. 12 tablet , MD        Lattie Haw, MD 09/25/20 1324

## 2020-09-23 NOTE — Discharge Instructions (Addendum)
Wear sling.  Apply ice pack for 20 to 30 minutes, 3 to 4 times daily  Continue until pain and swelling decrease.  May take Ibuprofen 200mg , 4 tabs every 8 hours with food.  Begin pendulum exercises as tolerated.

## 2020-09-25 ENCOUNTER — Other Ambulatory Visit: Payer: Self-pay

## 2020-09-25 ENCOUNTER — Ambulatory Visit (INDEPENDENT_AMBULATORY_CARE_PROVIDER_SITE_OTHER): Payer: Managed Care, Other (non HMO)

## 2020-09-25 DIAGNOSIS — J309 Allergic rhinitis, unspecified: Secondary | ICD-10-CM

## 2020-09-26 ENCOUNTER — Ambulatory Visit (INDEPENDENT_AMBULATORY_CARE_PROVIDER_SITE_OTHER): Payer: Managed Care, Other (non HMO)

## 2020-09-26 ENCOUNTER — Ambulatory Visit (INDEPENDENT_AMBULATORY_CARE_PROVIDER_SITE_OTHER): Payer: Managed Care, Other (non HMO) | Admitting: Sports Medicine

## 2020-09-26 DIAGNOSIS — M25511 Pain in right shoulder: Secondary | ICD-10-CM | POA: Diagnosis not present

## 2020-09-26 MED ORDER — HYDROCODONE-ACETAMINOPHEN 5-325 MG PO TABS: 1.0000 | ORAL_TABLET | Freq: Three times a day (TID) | ORAL | 0 refills | Status: DC | PRN

## 2020-09-26 MED ORDER — MELOXICAM 15 MG PO TABS: ORAL_TABLET | ORAL | 3 refills | Status: DC

## 2020-09-26 NOTE — Progress Notes (Signed)
    Procedures performed today:    Procedure: Limited diagnostic Ultrasound of right shoulder Device: Samsung HS60  Findings: Noted intact supraspinatus, infraspinatus, subscapularis, teres minor, long head of the biceps.  Subacromial/subdeltoid bursa was not significantly effused. Images permanently stored in PACS. Impression: Rotator cuff strain, cuff tendons and long head of the biceps intact.  Independent interpretation of notes and tests performed by another provider:   None.  Brief History, Exam, Impression, and Recommendations:    Acute pain of right shoulder This is a pleasant 30 year old female, she was playing football 4 days ago, went to throw the ball and felt severe pain in her right deltoid. Afterwards she had pain with most movements, weakness. On exam she has pain to the point where it is difficult to examine her shoulder so we did an ultrasound which shows intact supraspinatus, infraspinatus, subscapularis, teres minor, biceps. X-rays personally reviewed and are negative for fractures. I think this is a rotator cuff strain. Adding meloxicam for daily use for 2 weeks, refilling her hydrocodone for use at night and as needed for breakthrough pain. Rotator cuff strengthening rehabilitation given, I think she should wait a solid week before starting these. Return to see me in about 4 weeks for reevaluation, we may consider a subacromial injection if insufficiently better.     ___________________________________________ Ihor Austin. Benjamin Stain, M.D., ABFM., CAQSM. Primary Care and Sports Medicine [facility]  Adjunct Instructor of Family Medicine  [employer]

## 2020-09-26 NOTE — Assessment & Plan Note (Signed)
This is a pleasant 30 year old female, she was playing football 4 days ago, went to throw the ball and felt severe pain in her right deltoid. Afterwards she had pain with most movements, weakness. On exam she has pain to the point where it is difficult to examine her shoulder so we did an ultrasound which shows intact supraspinatus, infraspinatus, subscapularis, teres minor, biceps. X-rays personally reviewed and are negative for fractures. I think this is a rotator cuff strain. Adding meloxicam for daily use for 2 weeks, refilling her hydrocodone for use at night and as needed for breakthrough pain. Rotator cuff strengthening rehabilitation given, I think she should wait a solid week before starting these. Return to see me in about 4 weeks for reevaluation, we may consider a subacromial injection if insufficiently better.

## 2020-10-09 ENCOUNTER — Other Ambulatory Visit: Payer: Self-pay

## 2020-10-09 ENCOUNTER — Ambulatory Visit (INDEPENDENT_AMBULATORY_CARE_PROVIDER_SITE_OTHER): Payer: Managed Care, Other (non HMO)

## 2020-10-09 DIAGNOSIS — J309 Allergic rhinitis, unspecified: Secondary | ICD-10-CM | POA: Diagnosis not present

## 2020-10-13 ENCOUNTER — Emergency Department
Admission: EM | Admit: 2020-10-13 | Discharge: 2020-10-13 | Disposition: A | Discharge status: Home / Self Care | Payer: Managed Care, Other (non HMO) | Source: Home / Self Care | Attending: Family Medicine | Admitting: Family Medicine

## 2020-10-13 ENCOUNTER — Other Ambulatory Visit: Payer: Self-pay

## 2020-10-13 DIAGNOSIS — S060X0A Concussion without loss of consciousness, initial encounter: Secondary | ICD-10-CM
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
## 2020-10-13 NOTE — Discharge Instructions (Signed)
May take tylenol or ibuprofen for pain REST See Dr T this week

## 2020-10-23 ENCOUNTER — Other Ambulatory Visit: Payer: Self-pay

## 2020-10-23 ENCOUNTER — Ambulatory Visit (INDEPENDENT_AMBULATORY_CARE_PROVIDER_SITE_OTHER): Payer: Managed Care, Other (non HMO) | Admitting: *Deleted

## 2020-10-23 DIAGNOSIS — J309 Allergic rhinitis, unspecified: Secondary | ICD-10-CM | POA: Diagnosis not present

## 2020-10-24 ENCOUNTER — Ambulatory Visit: Payer: Managed Care, Other (non HMO) | Admitting: Sports Medicine

## 2020-10-24 DIAGNOSIS — M25511 Pain in right shoulder: Secondary | ICD-10-CM | POA: Diagnosis not present

## 2020-10-24 DIAGNOSIS — S060X0A Concussion without loss of consciousness, initial encounter: Secondary | ICD-10-CM | POA: Diagnosis not present

## 2020-10-24 DIAGNOSIS — S060X9A Concussion with loss of consciousness of unspecified duration, initial encounter: Secondary | ICD-10-CM | POA: Insufficient documentation

## 2020-10-24 DIAGNOSIS — S060XAA Concussion with loss of consciousness status unknown, initial encounter: Secondary | ICD-10-CM | POA: Insufficient documentation

## 2020-10-24 NOTE — Assessment & Plan Note (Signed)
Rotator cuff strain now resolved, no further intervention needed.

## 2020-10-24 NOTE — Assessment & Plan Note (Signed)
Barbara Sellers also got a concussion playing football a couple of weeks ago, she had symptoms for about a week and these have since resolved. She understands to contact me as soon as possible for work-up and return to play protocol if she sustains another concussion. She will keep her head on a swivel and keep her eyes on the numbers.

## 2020-10-24 NOTE — Progress Notes (Signed)
    Procedures performed today:    None.  Independent interpretation of notes and tests performed by another provider:   None.  Brief History, Exam, Impression, and Recommendations:    Acute pain of right shoulder Rotator cuff strain now resolved, no further intervention needed.  Concussion Barbara Sellers also got a concussion playing football a couple of weeks ago, she had symptoms for about a week and these have since resolved. She understands to contact me as soon as possible for work-up and return to play protocol if she sustains another concussion. She will keep her head on a swivel and keep her eyes on the numbers.    ___________________________________________ Ihor Austin. Benjamin Stain, M.D., ABFM., CAQSM. Primary Care and Sports Medicine [facility]  Adjunct Instructor of Family Medicine  [employer]

## 2020-10-30 ENCOUNTER — Other Ambulatory Visit: Payer: Self-pay

## 2020-10-30 ENCOUNTER — Ambulatory Visit (INDEPENDENT_AMBULATORY_CARE_PROVIDER_SITE_OTHER): Payer: Managed Care, Other (non HMO)

## 2020-10-30 DIAGNOSIS — J309 Allergic rhinitis, unspecified: Secondary | ICD-10-CM | POA: Diagnosis not present

## 2020-11-06 ENCOUNTER — Other Ambulatory Visit: Payer: Self-pay

## 2020-11-06 ENCOUNTER — Ambulatory Visit (INDEPENDENT_AMBULATORY_CARE_PROVIDER_SITE_OTHER): Payer: Managed Care, Other (non HMO)

## 2020-11-06 DIAGNOSIS — J309 Allergic rhinitis, unspecified: Secondary | ICD-10-CM | POA: Diagnosis not present

## 2020-11-07 ENCOUNTER — Ambulatory Visit: Payer: Managed Care, Other (non HMO) | Admitting: Sports Medicine

## 2020-11-07 ENCOUNTER — Encounter: Payer: Self-pay | Admitting: Sports Medicine

## 2020-11-07 ENCOUNTER — Ambulatory Visit (INDEPENDENT_AMBULATORY_CARE_PROVIDER_SITE_OTHER): Payer: Managed Care, Other (non HMO)

## 2020-11-07 ENCOUNTER — Telehealth (INDEPENDENT_AMBULATORY_CARE_PROVIDER_SITE_OTHER): Payer: Managed Care, Other (non HMO)

## 2020-11-07 DIAGNOSIS — R1032 Left lower quadrant pain: Secondary | ICD-10-CM | POA: Diagnosis not present

## 2020-11-07 DIAGNOSIS — K5792 Diverticulitis of intestine, part unspecified, without perforation or abscess without bleeding: Secondary | ICD-10-CM | POA: Diagnosis not present

## 2020-11-07 LAB — CBC WITH DIFFERENTIAL/PLATELET
Absolute Monocytes: 651 cells/uL (ref 200–950)
Basophils Absolute: 18 cells/uL (ref 0–200)
Basophils Relative: 0.2 %
Eosinophils Absolute: 141 cells/uL (ref 15–500)
Eosinophils Relative: 1.6 %
HCT: 42.9 % (ref 35.0–45.0)
Hemoglobin: 13.6 g/dL (ref 11.7–15.5)
Lymphs Abs: 2860 cells/uL (ref 850–3900)
MCH: 27.4 pg (ref 27.0–33.0)
MCHC: 31.7 g/dL — ABNORMAL LOW (ref 32.0–36.0)
MCV: 86.5 fL (ref 80.0–100.0)
MPV: 9.3 fL (ref 7.5–12.5)
Monocytes Relative: 7.4 %
Neutro Abs: 5130 cells/uL (ref 1500–7800)
Neutrophils Relative %: 58.3 %
Platelets: 333 10*3/uL (ref 140–400)
RBC: 4.96 10*6/uL (ref 3.80–5.10)
RDW: 12.1 % (ref 11.0–15.0)
Total Lymphocyte: 32.5 %
WBC: 8.8 10*3/uL (ref 3.8–10.8)

## 2020-11-07 LAB — COMPREHENSIVE METABOLIC PANEL
AG Ratio: 1.5 (calc) (ref 1.0–2.5)
ALT: 24 U/L (ref 6–29)
AST: 19 U/L (ref 10–30)
Albumin: 4.5 g/dL (ref 3.6–5.1)
Alkaline phosphatase (APISO): 90 U/L (ref 31–125)
BUN: 11 mg/dL (ref 7–25)
CO2: 26 mmol/L (ref 20–32)
Calcium: 9.6 mg/dL (ref 8.6–10.2)
Chloride: 102 mmol/L (ref 98–110)
Creat: 0.79 mg/dL (ref 0.50–1.10)
Globulin: 3 g/dL (calc) (ref 1.9–3.7)
Glucose, Bld: 124 mg/dL — ABNORMAL HIGH (ref 65–99)
Potassium: 4 mmol/L (ref 3.5–5.3)
Sodium: 138 mmol/L (ref 135–146)
Total Bilirubin: 0.2 mg/dL (ref 0.2–1.2)
Total Protein: 7.5 g/dL (ref 6.1–8.1)

## 2020-11-07 LAB — AMYLASE: Amylase: 41 U/L (ref 21–101)

## 2020-11-07 LAB — LIPASE: Lipase: 23 U/L (ref 7–60)

## 2020-11-07 MED ORDER — METRONIDAZOLE 500 MG PO TABS: 500.0000 mg | ORAL_TABLET | Freq: Two times a day (BID) | ORAL | 0 refills | Status: AC

## 2020-11-07 MED ORDER — CIPROFLOXACIN HCL 500 MG PO TABS: 500.0000 mg | ORAL_TABLET | Freq: Two times a day (BID) | ORAL | 0 refills | Status: DC

## 2020-11-07 MED ORDER — CIPROFLOXACIN HCL 750 MG PO TABS: 750.0000 mg | ORAL_TABLET | Freq: Two times a day (BID) | ORAL | 0 refills | Status: AC

## 2020-11-07 MED ORDER — METRONIDAZOLE 500 MG PO TABS: 500.0000 mg | ORAL_TABLET | Freq: Two times a day (BID) | ORAL | 0 refills | Status: DC

## 2020-11-07 NOTE — Progress Notes (Signed)
    Procedures performed today:    None.  Independent interpretation of notes and tests performed by another provider:   None.  Brief History, Exam, Impression, and Recommendations:    Acute abdominal pain in left lower quadrant [name] is a pleasant 30 year old female, there is a history of diverticulitis from last year, confirmed on CT abdomen and pelvis. Unfortunately they are having an increase in left lower quadrant abdominal pain with diarrhea. On exam there is rigidity and guarding. For this reason we do need to get a CT of the abdomen and pelvis, this will be with oral contrast as IV contrast is prohibited currently due to the shortage. Adding high-dose ciprofloxacin, metronidazole. CBC, CMP, amylase, lipase.     ___________________________________________ Ihor Austin. Benjamin Stain, M.D., ABFM., CAQSM. Primary Care and Sports Medicine [facility]  Adjunct Instructor of Family Medicine  [employer]

## 2020-11-07 NOTE — Assessment & Plan Note (Signed)
Barbara Sellers is a pleasant 30 year old female, there is a history of diverticulitis from last year, confirmed on CT abdomen and pelvis. Unfortunately they are having an increase in left lower quadrant abdominal pain with diarrhea. On exam there is rigidity and guarding. For this reason we do need to get a CT of the abdomen and pelvis, this will be with oral contrast as IV contrast is prohibited currently due to the shortage. Adding high-dose ciprofloxacin, metronidazole. CBC, CMP, amylase, lipase.

## 2020-11-07 NOTE — Telephone Encounter (Signed)
Pt called stating that they are having another diverticulitis flare up. Requesting recommendations from provider. Per pt, they are trying to avoid going to the ER again. Pls advise, thanks.

## 2020-11-07 NOTE — Telephone Encounter (Signed)
Antibiotics sent If severe pain, especially if fever / nausea, TO ER       Patient contacted office via phone >7 days from their last visit, about an issue unrelated to that visit. They did this independently (not in response to inquiry from this office). 5 minutes spent by myself and our staff in addressing this issue. Billed appropriately for services related to this encounter with diagnosis/diagnoses below:  The encounter diagnosis was Diverticulitis.  Meds ordered this encounter  Medications  . ciprofloxacin (CIPRO) 500 MG tablet    Sig: Take 1 tablet (500 mg total) by mouth 2 (two) times daily for 10 days.    Dispense:  20 tablet    Refill:  0  . metroNIDAZOLE (FLAGYL) 500 MG tablet    Sig: Take 1 tablet (500 mg total) by mouth 2 (two) times daily for 10 days.    Dispense:  14 tablet    Refill:  0    No orders of the defined types were placed in this encounter.   See MyChart message

## 2020-11-07 NOTE — Telephone Encounter (Signed)
Task completed. Left a detailed vm msg for pt regarding antibiotics sent to the pharmacy. Direct call back info provided.

## 2020-11-18 ENCOUNTER — Other Ambulatory Visit: Payer: Self-pay

## 2020-11-18 ENCOUNTER — Ambulatory Visit (INDEPENDENT_AMBULATORY_CARE_PROVIDER_SITE_OTHER): Payer: Managed Care, Other (non HMO)

## 2020-11-18 DIAGNOSIS — J309 Allergic rhinitis, unspecified: Secondary | ICD-10-CM | POA: Diagnosis not present

## 2020-11-19 DIAGNOSIS — J3081 Allergic rhinitis due to animal (cat) (dog) hair and dander: Secondary | ICD-10-CM | POA: Diagnosis not present

## 2020-11-19 NOTE — Progress Notes (Signed)
VIALS EXP 11-19-21 

## 2020-12-04 ENCOUNTER — Other Ambulatory Visit: Payer: Self-pay

## 2020-12-04 ENCOUNTER — Ambulatory Visit (INDEPENDENT_AMBULATORY_CARE_PROVIDER_SITE_OTHER): Payer: Managed Care, Other (non HMO)

## 2020-12-04 DIAGNOSIS — J309 Allergic rhinitis, unspecified: Secondary | ICD-10-CM

## 2020-12-11 ENCOUNTER — Ambulatory Visit (INDEPENDENT_AMBULATORY_CARE_PROVIDER_SITE_OTHER): Payer: Managed Care, Other (non HMO)

## 2020-12-11 ENCOUNTER — Other Ambulatory Visit: Payer: Self-pay

## 2020-12-11 DIAGNOSIS — J309 Allergic rhinitis, unspecified: Secondary | ICD-10-CM

## 2020-12-18 ENCOUNTER — Telehealth (INDEPENDENT_AMBULATORY_CARE_PROVIDER_SITE_OTHER): Payer: Managed Care, Other (non HMO) | Admitting: Osteopathic Medicine

## 2020-12-18 ENCOUNTER — Encounter: Payer: Self-pay | Admitting: Osteopathic Medicine

## 2020-12-18 VITALS — Wt 143.0 lb

## 2020-12-18 DIAGNOSIS — J4521 Mild intermittent asthma with (acute) exacerbation: Secondary | ICD-10-CM

## 2020-12-18 MED ORDER — PREDNISONE 20 MG PO TABS: 20.0000 mg | ORAL_TABLET | Freq: Two times a day (BID) | ORAL | 1 refills | Status: DC

## 2020-12-18 MED ORDER — AMOXICILLIN-POT CLAVULANATE 875-125 MG PO TABS: 1.0000 | ORAL_TABLET | Freq: Two times a day (BID) | ORAL | 0 refills | Status: AC

## 2020-12-18 MED ORDER — GUAIFENESIN-CODEINE 100-10 MG/5ML PO SYRP: 5.0000 mL | ORAL_SOLUTION | Freq: Four times a day (QID) | ORAL | 0 refills | Status: DC | PRN

## 2020-12-18 NOTE — Progress Notes (Signed)
Telemedicine Visit via  Audio only - telephone (patient preference /  technical difficulty with MyChart video application)  I connected with [name] on 12/18/20 at 3:30 PM  by phone or  telemedicine application as noted above  I verified that I am speaking with or regarding  the correct patient using two identifiers.  Participants: Myself, Dr Sunnie Nielsen DO Patient: Barbara Sellers Patient proxy if applicable: none Other, if applicable: none  Patient is at home I am in office at [facility]    I discussed the limitations of evaluation and management  by telemedicine and the availability of in person appointments.  The participant(s) above expressed understanding and  agreed to proceed with this appointment via telemedicine.       History of Present Illness: Barbara Sellers is a 30 y.o. female who would like to discuss feeling sick, asthma  Onset one week ago Fatigue, nausea, sore throat, coughing  Tried Delsym, Mucinex, Nyquil, Ibuprofen  Coughing, chest congestion are bit symptoms nogge (+)asthma, inhaler helping minimally      Observations/Objective: Wt 143 lb (64.9 kg)   BMI 27.02 kg/m  BP Readings from Last 3 Encounters:  11/07/20 109/72  10/13/20 118/72  09/23/20 117/76   Exam: Normal Speech.  NAD  Lab and Radiology Results No results found for this or any previous visit (from the past 72 hour(s)). No results found.     Assessment and Plan: 30 y.o. female with The encounter diagnosis was Mild intermittent asthma with exacerbation.  To ER/UC if SOB, fever, other concerns  Take augmentin antibiotics if prednisone and cough meds not helping within 24-48 hours   PDMP not reviewed this encounter. No orders of the defined types were placed in this encounter.  Meds ordered this encounter  Medications   predniSONE (DELTASONE) 20 MG tablet    Sig: Take 1 tablet (20 mg total) by mouth 2 (two) times daily with a meal.    Dispense:  10  tablet    Refill:  1   amoxicillin-clavulanate (AUGMENTIN) 875-125 MG tablet    Sig: Take 1 tablet by mouth 2 (two) times daily for 10 days.    Dispense:  20 tablet    Refill:  0   guaiFENesin-codeine (ROBITUSSIN AC) 100-10 MG/5ML syrup    Sig: Take 5-10 mLs by mouth 4 (four) times daily as needed for cough or congestion.    Dispense:  180 mL    Refill:  0   There are no Patient Instructions on file for this visit.  Instructions sent via MyChart.   Follow Up Instructions: Return if symptoms worsen or fail to improve.    I discussed the assessment and treatment plan with the patient. The patient was provided an opportunity to ask questions and all were answered. The patient agreed with the plan and demonstrated an understanding of the instructions.   The patient was advised to call back or seek an in-person evaluation if any new concerns, if symptoms worsen or if the condition fails to improve as anticipated.  21 minutes of non-face-to-face time was provided during this encounter.      . . . . . . . . . . . . . Marland Kitchen                   Historical information moved to improve visibility of documentation.  Past Medical History:  Diagnosis Date   ADHD (attention deficit hyperactivity disorder)    Asthma    Depression  Eczema    Family history of breast cancer    Family history of melanoma    Family history of pancreatic cancer    Family history of prostate cancer    Migraines    Ocular migraine 10/23/2019   PTSD (post-traumatic stress disorder)    Recurrent upper respiratory infection (URI)    Rheumatoid arthritis (HCC)    Seasonal allergies    Swine flu 2009   Urticaria    Vertigo    Past Surgical History:  Procedure Laterality Date   APPENDECTOMY  1996   ORIF DISTAL RADIUS FRACTURE     WISDOM TOOTH EXTRACTION     Social History   Tobacco Use   Smoking status: Former    Pack years: 0.00    Types: Cigarettes   Smokeless tobacco:  Never  Substance Use Topics   Alcohol use: Yes    Alcohol/week: 2.0 standard drinks    Types: 2 Standard drinks or equivalent per week    Comment: rare   family history includes Allergic rhinitis in her brother, brother, father, maternal aunt, maternal aunt, maternal aunt, maternal aunt, paternal aunt, paternal aunt, and paternal grandmother; Arrhythmia in her brother; Asthma in her father, maternal aunt, paternal aunt, paternal aunt, and paternal grandmother; Breast cancer (age of onset: 50) in her maternal aunt; Cancer in her father, maternal grandfather, maternal grandmother, and paternal grandfather; Cancer - Other in her father; Colon polyps in her father; Diabetes in her father and maternal aunt; Diverticulitis in her father; Eczema in her father; Hypertension in her paternal grandfather and paternal grandmother; Macular degeneration in her mother; Melanoma in her paternal aunt, paternal aunt and another family member; Pancreatic cancer in her maternal aunt and another family member; Skin cancer in her paternal grandfather and paternal grandmother; Urticaria in her mother.  Medications: Current Outpatient Medications  Medication Sig Dispense Refill   albuterol (PROVENTIL HFA;VENTOLIN HFA) 108 (90 Base) MCG/ACT inhaler Inhale into the lungs every 6 (six) hours as needed for wheezing or shortness of breath.      amoxicillin-clavulanate (AUGMENTIN) 875-125 MG tablet Take 1 tablet by mouth 2 (two) times daily for 10 days. 20 tablet 0   buPROPion (WELLBUTRIN XL) 150 MG 24 hr tablet Take by mouth.     drospirenone-ethinyl estradiol (NIKKI) 3-0.02 MG tablet Take 1 tablet by mouth daily. 90 tablet 3   EPINEPHrine 0.1 MG/0.1ML SOAJ Inject as directed.     fluticasone furoate-vilanterol (BREO ELLIPTA) 100-25 MCG/INH AEPB Inhale 1 puff into the lungs daily. Rinse mouth after each use. 60 each 2   guaiFENesin-codeine (ROBITUSSIN AC) 100-10 MG/5ML syrup Take 5-10 mLs by mouth 4 (four) times daily as  needed for cough or congestion. 180 mL 0   Multiple Vitamins-Minerals (MULTIVITAMIN ADULTS PO) Take by mouth.     pantoprazole (PROTONIX) 40 MG tablet Take 40 mg by mouth daily.     predniSONE (DELTASONE) 20 MG tablet Take 1 tablet (20 mg total) by mouth 2 (two) times daily with a meal. 10 tablet 1   sertraline (ZOLOFT) 100 MG tablet Take 150 mg by mouth daily.      traZODone (DESYREL) 50 MG tablet Take 100 mg by mouth at bedtime.      Drospirenone-Ethinyl Estradiol (NIKKI PO) Take by mouth.     No current facility-administered medications for this visit.   Allergies  Allergen Reactions   Cayenne Anaphylaxis   Azithromycin Itching and Rash    Also burning.   Also burning. Also burning.  Doxycycline Other (See Comments) and Swelling    Esophagus issues. "felt like razor blades" Throat swelling  Other reaction(s): Other (See Comments) Throat swelling Esophagus issues. "felt like razor blades" Esophagus issues. "felt like razor blades" Throat swelling   Doxycycline Calcium Other (See Comments)   Buspar [Buspirone] Anxiety    Rx caused muscle spasms     If phone visit, billing and coding can please add appropriate modifier if needed

## 2020-12-18 NOTE — Patient Instructions (Signed)
Medications & Home Remedies for Upper Respiratory Illness   Note: the following list assumes no pregnancy, normal liver & kidney function and no other drug interactions. Dr. Crit Obremski has highlighted medications which are safe for you to use, but these may not be appropriate for everyone. Always ask a pharmacist or qualified medical provider if you have any questions!    Aches/Pains, Fever, Headache OTC Acetaminophen (Tylenol) 500 mg tablets - take max 2 tablets (1000 mg) every 6 hours (4 times per day)  OTC Ibuprofen (Motrin) 200 mg tablets - take max 4 tablets (800 mg) every 6 hours*   Sinus Congestion Prescription Atrovent as directed OTC Nasal Saline if desired to rinse OTC Oxymetolazone (Afrin, others) sparing use due to rebound congestion, NEVER use in kids OTC Phenylephrine (Sudafed) 10 mg tablets every 4 hours (or the 12-hour formulation)* OTC Diphenhydramine (Benadryl) 25 mg tablets - take max 2 tablets every 4 hours   Cough & Sore Throat Prescription cough pills or syrups as directed OTC Dextromethorphan (Robitussin, others) - cough suppressant OTC Guaifenesin (Robitussin, Mucinex, others) - expectorant (helps cough up mucus) (Dextromethorphan and Guaifenesin also come in a combination tablet/syrup) OTC Lozenges w/ Benzocaine + Menthol (Cepacol) Honey - as much as you want! Teas which "coat the throat" - look for ingredients Elm Bark, Licorice Root, Marshmallow Root   Other Prescription Oral Steroids to decrease inflammation and improve energy Prescription Antibiotics if these are necessary for bacterial infection - take ALL, even if you're feeling better  OTC Zinc Lozenges within 24 hours of symptoms onset - mixed evidence this shortens the duration of the common cold Don't waste your money on Vitamin C or Echinacea in acute illness - it's already too late!    *Caution in patients with high blood pressure   

## 2020-12-18 NOTE — Progress Notes (Signed)
Attempted to contact the at 250 pm, no answer. Left a detailed vm msg. Direct call back info provided.

## 2020-12-25 ENCOUNTER — Ambulatory Visit (INDEPENDENT_AMBULATORY_CARE_PROVIDER_SITE_OTHER): Payer: Managed Care, Other (non HMO)

## 2020-12-25 ENCOUNTER — Other Ambulatory Visit: Payer: Self-pay

## 2020-12-25 DIAGNOSIS — J309 Allergic rhinitis, unspecified: Secondary | ICD-10-CM

## 2021-01-01 ENCOUNTER — Ambulatory Visit (INDEPENDENT_AMBULATORY_CARE_PROVIDER_SITE_OTHER): Payer: Managed Care, Other (non HMO)

## 2021-01-01 ENCOUNTER — Other Ambulatory Visit: Payer: Self-pay

## 2021-01-01 DIAGNOSIS — J309 Allergic rhinitis, unspecified: Secondary | ICD-10-CM

## 2021-01-12 ENCOUNTER — Telehealth: Payer: Self-pay

## 2021-01-12 NOTE — Telephone Encounter (Signed)
Transition Care Management Unsuccessful Follow-up Telephone Call  Date of discharge and from where:  01/09/2021 from [facility]  Attempts:  1st Attempt  Reason for unsuccessful TCM follow-up call:  Left voice message

## 2021-01-13 ENCOUNTER — Ambulatory Visit: Payer: Managed Care, Other (non HMO) | Admitting: Physician Assistant

## 2021-01-13 ENCOUNTER — Other Ambulatory Visit: Payer: Self-pay

## 2021-01-13 ENCOUNTER — Encounter: Payer: Self-pay | Admitting: Physician Assistant

## 2021-01-13 VITALS — BP 107/67 | HR 84 | Ht 61.0 in | Wt 146.0 lb

## 2021-01-13 DIAGNOSIS — J4540 Moderate persistent asthma, uncomplicated: Secondary | ICD-10-CM | POA: Diagnosis not present

## 2021-01-13 DIAGNOSIS — G5132 Clonic hemifacial spasm, left: Secondary | ICD-10-CM

## 2021-01-13 DIAGNOSIS — R251 Tremor, unspecified: Secondary | ICD-10-CM | POA: Diagnosis not present

## 2021-01-13 DIAGNOSIS — G5139 Clonic hemifacial spasm, unspecified: Secondary | ICD-10-CM

## 2021-01-13 DIAGNOSIS — R519 Headache, unspecified: Secondary | ICD-10-CM

## 2021-01-13 MED ORDER — FLUTICASONE FUROATE-VILANTEROL 200-25 MCG/INH IN AEPB: 1.0000 | INHALATION_SPRAY | Freq: Every day | RESPIRATORY_TRACT | 0 refills | Status: DC

## 2021-01-13 MED ORDER — ALBUTEROL SULFATE HFA 108 (90 BASE) MCG/ACT IN AERS: 1.0000 | INHALATION_SPRAY | RESPIRATORY_TRACT | 1 refills | Status: DC | PRN

## 2021-01-13 NOTE — Telephone Encounter (Signed)
Transition Care Management Unsuccessful Follow-up Telephone Call  Date of discharge and from where:  01/09/21 from [facility]  Attempts:  2nd Attempt  Reason for unsuccessful TCM follow-up call:  Left voice message    
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
## 2021-01-13 NOTE — Patient Instructions (Addendum)
Psychiatry [facility]  [phone]  No exercise or strenuous activity until evaluated by Neurology  Try higher dose Breo inhaler for the next month to get asthma under control Then go back to the lower dose inhaler

## 2021-01-14 NOTE — Telephone Encounter (Signed)
Transition Care Management Follow-up Telephone Call Date of discharge and from where: 01/09/21 from [facility] How have you been since you were released from the hospital? Patient had OV on 01/13/21 with Gena Fray, PA.  Any questions or concerns? No

## 2021-01-15 ENCOUNTER — Other Ambulatory Visit: Payer: Self-pay

## 2021-01-15 ENCOUNTER — Ambulatory Visit (INDEPENDENT_AMBULATORY_CARE_PROVIDER_SITE_OTHER): Payer: Managed Care, Other (non HMO)

## 2021-01-15 DIAGNOSIS — J309 Allergic rhinitis, unspecified: Secondary | ICD-10-CM

## 2021-01-15 DIAGNOSIS — G5132 Clonic hemifacial spasm, left: Secondary | ICD-10-CM | POA: Insufficient documentation

## 2021-01-15 MED ORDER — BUDESONIDE-FORMOTEROL FUMARATE 160-4.5 MCG/ACT IN AERO: 2.0000 | INHALATION_SPRAY | Freq: Two times a day (BID) | RESPIRATORY_TRACT | 0 refills | Status: DC

## 2021-01-19 ENCOUNTER — Telehealth: Payer: Self-pay

## 2021-01-19 NOTE — Telephone Encounter (Signed)
Barbara Sellers called and left a message that she will need FMLA paperwork filled out. She was seen by Vinetta Bergamo. I called and left a message asking patient to schedule an appointment for the [facility] paperwork. Vinetta Bergamo is not in the office and I am not sure when she will return.

## 2021-01-21 ENCOUNTER — Telehealth: Payer: Self-pay

## 2021-01-21 NOTE — Telephone Encounter (Signed)
Per [facility] - patient's insurance does not cover the Symbicort or it's generic. Pls send in an alternative rx. Pt did not express any preference.

## 2021-01-23 MED ORDER — ADVAIR HFA 115-21 MCG/ACT IN AERO: 2.0000 | INHALATION_SPRAY | Freq: Two times a day (BID) | RESPIRATORY_TRACT | 11 refills | Status: DC

## 2021-01-23 NOTE — Telephone Encounter (Signed)
Sent!

## 2021-02-05 ENCOUNTER — Ambulatory Visit (INDEPENDENT_AMBULATORY_CARE_PROVIDER_SITE_OTHER): Payer: Managed Care, Other (non HMO)

## 2021-02-05 ENCOUNTER — Other Ambulatory Visit: Payer: Self-pay

## 2021-02-05 DIAGNOSIS — J309 Allergic rhinitis, unspecified: Secondary | ICD-10-CM

## 2021-02-23 ENCOUNTER — Other Ambulatory Visit: Payer: Self-pay | Admitting: *Deleted

## 2021-02-23 MED ORDER — PANTOPRAZOLE SODIUM 40 MG PO TBEC: 40.0000 mg | DELAYED_RELEASE_TABLET | Freq: Every day | ORAL | 1 refills | Status: DC

## 2021-03-05 ENCOUNTER — Other Ambulatory Visit: Payer: Self-pay

## 2021-03-05 ENCOUNTER — Ambulatory Visit (INDEPENDENT_AMBULATORY_CARE_PROVIDER_SITE_OTHER): Payer: Managed Care, Other (non HMO)

## 2021-03-05 DIAGNOSIS — J309 Allergic rhinitis, unspecified: Secondary | ICD-10-CM | POA: Diagnosis not present

## 2021-03-12 ENCOUNTER — Ambulatory Visit (INDEPENDENT_AMBULATORY_CARE_PROVIDER_SITE_OTHER): Payer: Managed Care, Other (non HMO)

## 2021-03-12 ENCOUNTER — Other Ambulatory Visit: Payer: Self-pay

## 2021-03-12 DIAGNOSIS — J309 Allergic rhinitis, unspecified: Secondary | ICD-10-CM | POA: Diagnosis not present

## 2021-03-19 ENCOUNTER — Ambulatory Visit (INDEPENDENT_AMBULATORY_CARE_PROVIDER_SITE_OTHER): Payer: Managed Care, Other (non HMO)

## 2021-03-19 ENCOUNTER — Other Ambulatory Visit: Payer: Self-pay

## 2021-03-19 DIAGNOSIS — J309 Allergic rhinitis, unspecified: Secondary | ICD-10-CM

## 2021-03-26 ENCOUNTER — Other Ambulatory Visit: Payer: Self-pay

## 2021-03-26 ENCOUNTER — Ambulatory Visit (INDEPENDENT_AMBULATORY_CARE_PROVIDER_SITE_OTHER): Payer: Managed Care, Other (non HMO)

## 2021-03-26 DIAGNOSIS — J309 Allergic rhinitis, unspecified: Secondary | ICD-10-CM

## 2021-04-02 ENCOUNTER — Ambulatory Visit (INDEPENDENT_AMBULATORY_CARE_PROVIDER_SITE_OTHER): Payer: Managed Care, Other (non HMO)

## 2021-04-02 ENCOUNTER — Other Ambulatory Visit: Payer: Self-pay

## 2021-04-02 DIAGNOSIS — J309 Allergic rhinitis, unspecified: Secondary | ICD-10-CM

## 2021-04-09 ENCOUNTER — Other Ambulatory Visit: Payer: Self-pay

## 2021-04-09 ENCOUNTER — Ambulatory Visit (INDEPENDENT_AMBULATORY_CARE_PROVIDER_SITE_OTHER): Payer: Managed Care, Other (non HMO)

## 2021-04-09 DIAGNOSIS — J309 Allergic rhinitis, unspecified: Secondary | ICD-10-CM | POA: Diagnosis not present

## 2021-04-15 DIAGNOSIS — J302 Other seasonal allergic rhinitis: Secondary | ICD-10-CM | POA: Diagnosis not present

## 2021-04-15 NOTE — Progress Notes (Signed)
VIALS MADE. EXP 04-15-22 

## 2021-04-23 ENCOUNTER — Ambulatory Visit (INDEPENDENT_AMBULATORY_CARE_PROVIDER_SITE_OTHER): Payer: Managed Care, Other (non HMO)

## 2021-04-23 ENCOUNTER — Other Ambulatory Visit: Payer: Self-pay

## 2021-04-23 DIAGNOSIS — J309 Allergic rhinitis, unspecified: Secondary | ICD-10-CM | POA: Diagnosis not present

## 2021-05-14 ENCOUNTER — Other Ambulatory Visit: Payer: Self-pay

## 2021-05-14 ENCOUNTER — Ambulatory Visit (INDEPENDENT_AMBULATORY_CARE_PROVIDER_SITE_OTHER): Payer: Managed Care, Other (non HMO)

## 2021-05-14 DIAGNOSIS — J309 Allergic rhinitis, unspecified: Secondary | ICD-10-CM

## 2021-05-19 ENCOUNTER — Other Ambulatory Visit: Payer: Self-pay

## 2021-05-19 ENCOUNTER — Ambulatory Visit (INDEPENDENT_AMBULATORY_CARE_PROVIDER_SITE_OTHER): Payer: Managed Care, Other (non HMO)

## 2021-05-19 DIAGNOSIS — J309 Allergic rhinitis, unspecified: Secondary | ICD-10-CM | POA: Diagnosis not present

## 2021-05-28 ENCOUNTER — Other Ambulatory Visit: Payer: Self-pay

## 2021-05-28 ENCOUNTER — Ambulatory Visit (INDEPENDENT_AMBULATORY_CARE_PROVIDER_SITE_OTHER): Payer: Managed Care, Other (non HMO)

## 2021-05-28 DIAGNOSIS — J309 Allergic rhinitis, unspecified: Secondary | ICD-10-CM | POA: Diagnosis not present

## 2021-06-04 ENCOUNTER — Other Ambulatory Visit: Payer: Self-pay

## 2021-06-04 ENCOUNTER — Ambulatory Visit (INDEPENDENT_AMBULATORY_CARE_PROVIDER_SITE_OTHER): Payer: Managed Care, Other (non HMO)

## 2021-06-04 DIAGNOSIS — J309 Allergic rhinitis, unspecified: Secondary | ICD-10-CM | POA: Diagnosis not present

## 2021-06-11 ENCOUNTER — Other Ambulatory Visit: Payer: Self-pay

## 2021-06-11 ENCOUNTER — Ambulatory Visit (INDEPENDENT_AMBULATORY_CARE_PROVIDER_SITE_OTHER): Payer: Managed Care, Other (non HMO)

## 2021-06-11 DIAGNOSIS — J309 Allergic rhinitis, unspecified: Secondary | ICD-10-CM | POA: Diagnosis not present

## 2021-06-16 ENCOUNTER — Other Ambulatory Visit: Payer: Self-pay

## 2021-06-16 ENCOUNTER — Ambulatory Visit (INDEPENDENT_AMBULATORY_CARE_PROVIDER_SITE_OTHER): Payer: Managed Care, Other (non HMO)

## 2021-06-16 DIAGNOSIS — J309 Allergic rhinitis, unspecified: Secondary | ICD-10-CM | POA: Diagnosis not present

## 2021-07-02 ENCOUNTER — Other Ambulatory Visit: Payer: Self-pay

## 2021-07-02 ENCOUNTER — Ambulatory Visit (INDEPENDENT_AMBULATORY_CARE_PROVIDER_SITE_OTHER): Payer: Managed Care, Other (non HMO)

## 2021-07-02 DIAGNOSIS — J309 Allergic rhinitis, unspecified: Secondary | ICD-10-CM | POA: Diagnosis not present

## 2021-07-03 ENCOUNTER — Other Ambulatory Visit: Payer: Self-pay

## 2021-07-03 ENCOUNTER — Emergency Department
Admission: EM | Admit: 2021-07-03 | Discharge: 2021-07-03 | Disposition: A | Discharge status: Home / Self Care | Payer: Managed Care, Other (non HMO) | Source: Home / Self Care | Attending: Family Medicine | Admitting: Family Medicine

## 2021-07-03 DIAGNOSIS — J0101 Acute recurrent maxillary sinusitis: Secondary | ICD-10-CM

## 2021-07-03 MED ORDER — PREDNISONE 20 MG PO TABS: ORAL_TABLET | ORAL | 0 refills | Status: DC

## 2021-07-03 MED ORDER — AMOXICILLIN 875 MG PO TABS: ORAL_TABLET | ORAL | 0 refills | Status: DC

## 2021-07-03 NOTE — Discharge Instructions (Signed)
Take plain guaifenesin (1200mg  extended release tabs such as Mucinex) twice daily, with plenty of water, for cough and congestion.  Continue taking a product containing Pseudoephedrine for sinus congestion.  Get adequate rest.   May use Afrin nasal spray (or generic oxymetazoline) each morning for about 5 days and then discontinue.  Also recommend using saline nasal spray several times daily and saline nasal irrigation (AYR is a common brand).  Use Flonase nasal spray each morning after using Afrin nasal spray and saline nasal irrigation. Try warm salt water gargles for sore throat.  Use albuterol inhaler as needed. Stop all antihistamines for now. May take Delsym Cough Suppressant ("12 Hour Cough Relief") at bedtime for nighttime cough.

## 2021-07-03 NOTE — ED Provider Notes (Signed)
[name]    CSN: [id number] Arrival date & time: 07/03/21  1201      History   Chief Complaint Chief Complaint  Patient presents with   Cough    Cough, nose bleeds, and chest congestion. X2 weeks    HPI Barbara Sellers is a 31 y.o. female.   Patient developed mild URI symptoms about 2 weeks ago with initial sore throat, sinus congestion, sneezing, fever to 99.8, and cough.  She has improved except for persistent sinus congestion and facial pain.  She has a history of recurring sinusitis.  The history is provided by the patient.   Past Medical History:  Diagnosis Date   ADHD (attention deficit hyperactivity disorder)    Asthma    Depression    Eczema    Family history of breast cancer    Family history of melanoma    Family history of pancreatic cancer    Family history of prostate cancer    Migraines    Ocular migraine 10/23/2019   PTSD (post-traumatic stress disorder)    Recurrent upper respiratory infection (URI)    Rheumatoid arthritis ([place])    Seasonal allergies    Swine flu 2009   Urticaria    Vertigo     Patient Active Problem List   Diagnosis Date Noted   Hemifacial spasm of left side of face 01/15/2021   Acute abdominal pain in left lower quadrant 11/07/2020   Concussion 10/24/2020   Acute pain of right shoulder 09/26/2020   Seasonal and perennial allergic rhinoconjunctivitis 03/04/2020   Genetic testing 12/10/2019   Asthma 11/29/2019   Adverse food reaction 11/29/2019   History of frequent upper respiratory infection 11/29/2019   Drug reaction 11/29/2019   Family history of melanoma    Family history of breast cancer    Family history of pancreatic cancer    Family history of prostate cancer    Ocular migraine 10/23/2019   Cubital tunnel syndrome, left 07/19/2018   S/P hardware removal 10/09/2015   Fracture of metacarpal base of left hand, closed 11/15/2014   Closed fracture of distal end of left radius 11/11/2014    Past Surgical  History:  Procedure Laterality Date   APPENDECTOMY  1996   lasik Bilateral    ORIF DISTAL RADIUS FRACTURE     WISDOM TOOTH EXTRACTION      OB History     Gravida  0   Para      Term      Preterm      AB      Living         SAB      IAB      Ectopic      Multiple      Live Births               Home Medications    Prior to Admission medications   Medication Sig Start Date End Date Taking? Authorizing Provider  albuterol (VENTOLIN HFA) 108 (90 Base) MCG/ACT inhaler Inhale 1-2 puffs into the lungs every 4 (four) hours as needed for wheezing or shortness of breath. 01/13/21  Yes Trixie Dredge, PA-C  amoxicillin (AMOXIL) 875 MG tablet Take one tab PO Q12hr 07/03/21  Yes Kandra Nicolas, MD  aspirin-acetaminophen-caffeine (EXCEDRIN MIGRAINE) [phone] MG tablet Take by mouth every 6 (six) hours as needed.   Yes [provider]  EPINEPHrine 0.1 MG/0.1ML SOAJ Inject as directed.   Yes [provider]  pantoprazole (PROTONIX)  40 MG tablet Take 1 tablet (40 mg total) by mouth daily. 02/23/21  Yes Sunnie Nielsen, DO  predniSONE (DELTASONE) 20 MG tablet Take one tab by mouth twice daily for 4 days, then one daily for 3 days. Take with food. 07/03/21  Yes Lattie Haw, MD  buPROPion (WELLBUTRIN XL) 150 MG 24 hr tablet Take by mouth. 09/04/20   [provider]  fluticasone-salmeterol (ADVAIR HFA) 115-21 MCG/ACT inhaler Inhale 2 puffs into the lungs 2 (two) times daily. 01/23/21   Sunnie Nielsen, DO  Multiple Vitamins-Minerals (MULTIVITAMIN ADULTS PO) Take by mouth.    [provider]  prazosin (MINIPRESS) 1 MG capsule SMARTSIG:3 Capsule(s) By Mouth Every Night PRN 11/01/20   [provider]  sertraline (ZOLOFT) 100 MG tablet Take 150 mg by mouth daily.  09/01/19   [provider]  traZODone (DESYREL) 50 MG tablet Take 100 mg by mouth at bedtime.  10/04/19   [provider]    Family History Family  History  Problem Relation Age of Onset   Macular degeneration Mother    Urticaria Mother    Colon polyps Father    Diabetes Father    Diverticulitis Father    Cancer Father    Cancer - Other Father        Adenocarcinoma   Allergic rhinitis Father    Asthma Father    Eczema Father    Allergic rhinitis Brother    Arrhythmia Brother        half-brother   Allergic rhinitis Brother    Diabetes Maternal Aunt    Allergic rhinitis Maternal Aunt    Cancer Maternal Grandmother        SKIN   Cancer Maternal Grandfather        SKIN   Hypertension Paternal Grandmother    Skin cancer Paternal Grandmother    Allergic rhinitis Paternal Grandmother    Asthma Paternal Grandmother    Hypertension Paternal Grandfather    Cancer Paternal Grandfather        melanoma, kidney, bladder, prostate, lung, liver cancer   Skin cancer Paternal Grandfather    Melanoma Paternal Aunt    Allergic rhinitis Paternal Aunt    Asthma Paternal Aunt    Pancreatic cancer Other    Breast cancer Maternal Aunt 59   Allergic rhinitis Maternal Aunt    Pancreatic cancer Maternal Aunt    Allergic rhinitis Maternal Aunt    Allergic rhinitis Maternal Aunt    Asthma Maternal Aunt    Melanoma Paternal Aunt    Allergic rhinitis Paternal Aunt    Asthma Paternal Aunt    Melanoma Other     Social History Social History   Tobacco Use   Smoking status: Former    Types: Cigarettes   Smokeless tobacco: Never  Vaping Use   Vaping Use: Never used  Substance Use Topics   Alcohol use: Yes    Alcohol/week: 2.0 standard drinks    Types: 2 Standard drinks or equivalent per week    Comment: rare   Drug use: Never     Allergies   Cayenne, Azithromycin, Doxycycline, Doxycycline calcium, and Buspar [buspirone]   Review of Systems Review of Systems + sore throat + cough + sneezing No pleuritic pain No wheezing + nasal congestion + post-nasal drainage + sinus pain/pressure No itchy/red eyes No earache No  hemoptysis No SOB + fever, + chills No nausea No vomiting No abdominal pain No diarrhea No urinary symptoms No skin rash + fatigue No myalgias  No headache Used OTC meds (Alka Seltzer) without relief   Physical Exam Triage Vital Signs ED Triage Vitals  Enc Vitals Group     BP 07/03/21 1422 127/78     Pulse Rate 07/03/21 1422 68     Resp 07/03/21 1422 20     Temp 07/03/21 1422 98.3 F (36.8 C)     Temp Source 07/03/21 1422 Oral     SpO2 07/03/21 1422 96 %     Weight 07/03/21 1417 158 lb (71.7 kg)     Height 07/03/21 1417 5\' 1"  (1.549 m)     Head Circumference --      Peak Flow --      Pain Score 07/03/21 1417 6     Pain Loc --      Pain Edu? --      Excl. in [place]? --    No data found.  Updated Vital Signs BP 127/78 (BP Location: Right Arm)    Pulse 68    Temp 98.3 F (36.8 C) (Oral)    Resp 20    Ht 5\' 1"  (1.549 m)    Wt 71.7 kg    LMP 06/30/2021    SpO2 96%    BMI 29.85 kg/m   Visual Acuity Right Eye Distance:   Left Eye Distance:   Bilateral Distance:    Right Eye Near:   Left Eye Near:    Bilateral Near:     Physical Exam Nursing notes and Vital Signs reviewed. Appearance:  Patient appears stated age, and in no acute distress Eyes:  Pupils are equal, round, and reactive to light and accomodation.  Extraocular movement is intact.  Conjunctivae are not inflamed  Ears:  Canals normal.  Tympanic membranes normal.  Nose:  Congested turbinates.   Maxillary sinus tenderness is present.  Pharynx:  Normal Neck:  Supple.  Mildly enlarged lateral nodes are present, tender to palpation on the left.   Lungs:  Clear to auscultation.  Breath sounds are equal.  Moving air well. Heart:  Regular rate and rhythm without murmurs, rubs, or gallops.  Abdomen:  Nontender without masses or hepatosplenomegaly.  Bowel sounds are present.  No CVA or flank tenderness.  Extremities:  No edema.  Skin:  No rash present.   UC Treatments / Results  Labs (all labs ordered are listed,  but only abnormal results are displayed) Labs Reviewed - No data to display  EKG   Radiology No results found.  Procedures Procedures (including critical care time)  Medications Ordered in UC Medications - No data to display  Initial Impression / Assessment and Plan / UC Course  I have reviewed the triage vital signs and the nursing notes.  Pertinent labs & imaging results that were available during my care of the patient were reviewed by me and considered in my medical decision making (see chart for details).    Begin amoxicillin and prednisone burst/taper. Followup with Family Doctor if not improved in one week.   Final Clinical Impressions(s) / UC Diagnoses   Final diagnoses:  Acute recurrent maxillary sinusitis     Discharge Instructions      Take plain guaifenesin (1200mg  extended release tabs such as Mucinex) twice daily, with plenty of water, for cough and congestion.  Continue taking a product containing Pseudoephedrine for sinus congestion.  Get adequate rest.   May use Afrin nasal spray (or generic oxymetazoline) each morning for about 5 days and then discontinue.  Also recommend using saline nasal spray several  times daily and saline nasal irrigation (AYR is a common brand).  Use Flonase nasal spray each morning after using Afrin nasal spray and saline nasal irrigation. Try warm salt water gargles for sore throat.  Use albuterol inhaler as needed. Stop all antihistamines for now. May take Delsym Cough Suppressant ("12 Hour Cough Relief") at bedtime for nighttime cough.     ED Prescriptions     Medication Sig Dispense Auth. Provider   predniSONE (DELTASONE) 20 MG tablet Take one tab by mouth twice daily for 4 days, then one daily for 3 days. Take with food. 11 tablet Kandra Nicolas, MD   amoxicillin (AMOXIL) 875 MG tablet Take one tab PO Q12hr 20 tablet Kandra Nicolas, MD         Kandra Nicolas, MD 07/04/21 [phone]

## 2021-07-03 NOTE — ED Triage Notes (Signed)
Pt states that she also has some facial pain.

## 2021-07-03 NOTE — ED Triage Notes (Signed)
Pt states that she has a cough, nose bleeds and chest congestion. X2 weeks  Pt states that she is vaccinated for covid. Pt states that she has had flu vaccine.

## 2021-07-16 ENCOUNTER — Ambulatory Visit (INDEPENDENT_AMBULATORY_CARE_PROVIDER_SITE_OTHER): Payer: Managed Care, Other (non HMO)

## 2021-07-16 ENCOUNTER — Other Ambulatory Visit: Payer: Self-pay

## 2021-07-16 DIAGNOSIS — J309 Allergic rhinitis, unspecified: Secondary | ICD-10-CM | POA: Diagnosis not present

## 2021-07-30 ENCOUNTER — Ambulatory Visit (INDEPENDENT_AMBULATORY_CARE_PROVIDER_SITE_OTHER): Payer: Managed Care, Other (non HMO)

## 2021-07-30 ENCOUNTER — Other Ambulatory Visit: Payer: Self-pay

## 2021-07-30 DIAGNOSIS — J309 Allergic rhinitis, unspecified: Secondary | ICD-10-CM

## 2021-08-13 ENCOUNTER — Ambulatory Visit (INDEPENDENT_AMBULATORY_CARE_PROVIDER_SITE_OTHER): Payer: Managed Care, Other (non HMO) | Admitting: *Deleted

## 2021-08-13 ENCOUNTER — Other Ambulatory Visit: Payer: Self-pay

## 2021-08-13 DIAGNOSIS — J309 Allergic rhinitis, unspecified: Secondary | ICD-10-CM | POA: Diagnosis not present

## 2021-08-24 DIAGNOSIS — J3081 Allergic rhinitis due to animal (cat) (dog) hair and dander: Secondary | ICD-10-CM | POA: Diagnosis not present

## 2021-08-24 NOTE — Progress Notes (Signed)
VIALS EXP 08-24-22 °

## 2021-08-25 ENCOUNTER — Other Ambulatory Visit: Payer: Self-pay | Admitting: Osteopathic Medicine

## 2021-08-27 ENCOUNTER — Other Ambulatory Visit: Payer: Self-pay

## 2021-08-27 ENCOUNTER — Ambulatory Visit (INDEPENDENT_AMBULATORY_CARE_PROVIDER_SITE_OTHER): Payer: Managed Care, Other (non HMO)

## 2021-08-27 DIAGNOSIS — J309 Allergic rhinitis, unspecified: Secondary | ICD-10-CM

## 2021-09-08 ENCOUNTER — Other Ambulatory Visit: Payer: Self-pay

## 2021-09-08 ENCOUNTER — Ambulatory Visit (INDEPENDENT_AMBULATORY_CARE_PROVIDER_SITE_OTHER): Payer: Managed Care, Other (non HMO)

## 2021-09-08 DIAGNOSIS — J309 Allergic rhinitis, unspecified: Secondary | ICD-10-CM | POA: Diagnosis not present

## 2021-09-15 ENCOUNTER — Ambulatory Visit (INDEPENDENT_AMBULATORY_CARE_PROVIDER_SITE_OTHER): Payer: Managed Care, Other (non HMO) | Admitting: *Deleted

## 2021-09-15 ENCOUNTER — Other Ambulatory Visit: Payer: Self-pay

## 2021-09-15 DIAGNOSIS — J309 Allergic rhinitis, unspecified: Secondary | ICD-10-CM | POA: Diagnosis not present

## 2021-09-24 ENCOUNTER — Ambulatory Visit (INDEPENDENT_AMBULATORY_CARE_PROVIDER_SITE_OTHER): Payer: Managed Care, Other (non HMO)

## 2021-09-24 DIAGNOSIS — J309 Allergic rhinitis, unspecified: Secondary | ICD-10-CM

## 2021-09-26 ENCOUNTER — Other Ambulatory Visit: Payer: Self-pay | Admitting: Sports Medicine

## 2021-09-28 ENCOUNTER — Encounter (HOSPITAL_COMMUNITY): Payer: Self-pay | Admitting: Psychiatry

## 2021-09-28 ENCOUNTER — Ambulatory Visit (INDEPENDENT_AMBULATORY_CARE_PROVIDER_SITE_OTHER): Payer: 59 | Admitting: Psychiatry

## 2021-09-28 DIAGNOSIS — G43909 Migraine, unspecified, not intractable, without status migrainosus: Secondary | ICD-10-CM | POA: Insufficient documentation

## 2021-09-28 DIAGNOSIS — F419 Anxiety disorder, unspecified: Secondary | ICD-10-CM | POA: Diagnosis not present

## 2021-09-28 DIAGNOSIS — F900 Attention-deficit hyperactivity disorder, predominantly inattentive type: Secondary | ICD-10-CM | POA: Diagnosis not present

## 2021-09-28 DIAGNOSIS — F4310 Post-traumatic stress disorder, unspecified: Secondary | ICD-10-CM

## 2021-09-28 DIAGNOSIS — F411 Generalized anxiety disorder: Secondary | ICD-10-CM | POA: Diagnosis not present

## 2021-09-28 DIAGNOSIS — F5102 Adjustment insomnia: Secondary | ICD-10-CM

## 2021-09-28 MED ORDER — TRAZODONE HCL 50 MG PO TABS: 50.0000 mg | ORAL_TABLET | Freq: Every evening | ORAL | 0 refills | Status: DC | PRN

## 2021-09-28 MED ORDER — AMPHETAMINE-DEXTROAMPHETAMINE 5 MG PO TABS: 5.0000 mg | ORAL_TABLET | Freq: Every day | ORAL | 0 refills | Status: DC

## 2021-09-28 MED ORDER — SERTRALINE HCL 50 MG PO TABS: 50.0000 mg | ORAL_TABLET | Freq: Every day | ORAL | 0 refills | Status: DC
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
## 2021-09-30 NOTE — Progress Notes (Signed)
? ?Follow Up Note ? ?RE: Barbara Sellers MRN: [id number] DOB: 09-20-1990 ?Date of Office Visit: 10/01/2021 ? ?Referring provider: No ref. provider found ?Primary care provider: Pcp, No ? ?Chief Complaint: follow up ? ?History of Present Illness: ?I had the pleasure of seeing Barbara Sellers for a follow up visit at the [facility] on 10/01/2021. She is a 31 y.o. female, who is being followed for allergic rhinoconjunctivitis on AIT, reactive airway disease, history of frequent URIs, adverse food reaction and adverse drug reaction. Her previous allergy office visit was on 03/04/2020 with Dr. Selena Batten. Today is a regular follow up visit. ? ?Asthma  ?No maintenance inhaler for over 1 year due to insurance issues. ?Denies any SOB, coughing, wheezing, chest tightness, nocturnal awakenings, ER/urgent care visits or prednisone use since the last visit. ?No albuterol use.  ?Patient has been able to run outdoors with no issues.  ?  ?Seasonal and perennial allergic rhinoconjunctivitis ?Some localized reaction with the injections and doing much better with the injections. ?Taking daily antihistamines and not sure if symptoms would flare if she stops as she has not missed a dose.  ?Not taking any nasal sprays or eye drops.  ? ?History of frequent upper respiratory infection ?None since the last visit.  ?  ?Adverse food reaction ?Patient had a little bit of cayenne pepper and had throat itching for 30 seconds only. ? ?Assessment and Plan: ?Barbara Sellers is a 31 y.o. female with: ?Mild intermittent asthma without complication ?Past history - Diagnosed with asthma as a child and was doing well up until recently. She had COVID-19 in January 2021. Recently started on Incruse and using albuterol less but was using it multiple times per day. Prednisone did not help. CXR on 07/11/2019 - Small focus of airspace disease in the right lung base. 2021 spirometry was normal with no improvement in FEV1 post bronchodilator treatment. Clinically  feeling the same.  ?Interim history - stopped maintenance inhaler 1 year ago due to insurance issues. No flare in symptoms. ?Today's spirometry was normal.  ?May use albuterol rescue inhaler 2 puffs every 4 to 6 hours as needed for shortness of breath, chest tightness, coughing, and wheezing. May use albuterol rescue inhaler 2 puffs 5 to 15 minutes prior to strenuous physical activities. Monitor frequency of use.  ? ?Seasonal and perennial allergic rhinoconjunctivitis ?Past history - Perennial rhino conjunctivitis symptoms for 20+ years with worsening in the spring and summer. Other triggers include cats. 2015 skin testing in the past was positive to multiple pollens, cat, mold per patient report. On allergy injections in the 1990s with good benefit. 2021 skin testing showed: Positive to grass, ragweed. Borderline to mold and cat. Started AIT on 12/20/2019 (G-RW and M-C).  ?Interim history - doing well with below regimen with no issues. ?Continue environmental control measures. ?Continue allergy injections - given today. ?Okay to build up new vial with 0.2mL, 0.30mL, 0.42mL schedule.  ?Only take the allergy medication as needed and not daily. ?Definitely take on the days of injections and the days when doing yardwork. ? ?Adverse food reaction ?Past history - Anaphylactic reaction to cayenne pepper in the past. Tolerates other peppers including jalapenos with no issues. Last testing done in 2015. ?Interim history - accidentally had a food with small amount of cayenne pepper and only had throat itching for 30 seconds.  ?Continue strict avoidance of cayenne pepper. ?For mild symptoms you can take over the counter antihistamines such as Benadryl and monitor symptoms closely. If  symptoms worsen or if you have severe symptoms including breathing issues, throat closure, significant swelling, whole body hives, severe diarrhea and vomiting, lightheadedness then inject epinephrine and seek immediate medical care  afterwards. ?Emergency action plan in place.  ? ?Drug reaction ?Past history - Reactions to azithromycin and doxycycline in the past. ?Continue to avoid.  ?Consider drug challenges in the future.  ? ?Return in about 1 year (around 10/02/2022). ? ?No orders of the defined types were placed in this encounter. ? ?Lab Orders  ?No laboratory test(s) ordered today  ? ? ?Diagnostics: ?Spirometry:  ?Tracings reviewed. Her effort: Good reproducible efforts. ?FVC: 3.23L ?FEV1: 2.59L, 102% predicted ?FEV1/FVC ratio: 80% ?Interpretation: Spirometry consistent with normal pattern.  ?Please see scanned spirometry results for details. ? ?Medication List:  ?Current Outpatient Medications  ?Medication Sig Dispense Refill  ? albuterol (VENTOLIN HFA) 108 (90 Base) MCG/ACT inhaler Inhale 1-2 puffs into the lungs every 4 (four) hours as needed for wheezing or shortness of breath. 8 g 1  ? amphetamine-dextroamphetamine (ADDERALL) 5 MG tablet Take 1 tablet (5 mg total) by mouth daily. 30 tablet 0  ? sertraline (ZOLOFT) 50 MG tablet Take 1 tablet (50 mg total) by mouth daily. 30 tablet 0  ? traZODone (DESYREL) 50 MG tablet Take 1 tablet (50 mg total) by mouth at bedtime as needed for sleep. 30 tablet 0  ? ?No current facility-administered medications for this visit.  ? ?Allergies: ?Allergies  ?Allergen Reactions  ? Cayenne Anaphylaxis  ? Azithromycin Itching and Rash  ?  Also burning. ? ? ?Also burning. ?Also burning.  ? Doxycycline Other (See Comments) and Swelling  ?  Esophagus issues. "felt like razor blades" ?Throat swelling ? ?Other reaction(s): Other (See Comments) ?Throat swelling ?Esophagus issues. "felt like razor blades" ?Esophagus issues. "felt like razor blades" ?Throat swelling  ? Doxycycline Calcium Other (See Comments)  ? Buspar [Buspirone] Anxiety  ?  Rx caused muscle spasms  ? ?I reviewed her past medical history, social history, family history, and environmental history and no significant changes have been reported from her  previous visit. ? ?Review of Systems  ?Constitutional:  Negative for appetite change, chills, fever and unexpected weight change.  ?HENT:  Negative for congestion, rhinorrhea and sneezing.   ?Eyes:  Negative for itching.  ?Respiratory:  Negative for cough, chest tightness, shortness of breath and wheezing.   ?Cardiovascular:  Negative for chest pain.  ?Gastrointestinal:  Negative for abdominal pain.  ?Genitourinary:  Negative for difficulty urinating.  ?Skin:  Negative for rash.  ?Allergic/Immunologic: Positive for environmental allergies and food allergies.  ? ?Objective: ?BP 118/68 (BP Location: Right Arm, Patient Position: Sitting, Cuff Size: Large)   Pulse 92   Temp (!) 97.2 ?F (36.2 ?C) (Temporal)   Resp 16   Ht 5\' 2"  (1.575 m)   Wt 176 lb (79.8 kg)   SpO2 95%   BMI 32.19 kg/m?  ?Body mass index is 32.19 kg/m?Marland Kitchen. ?Physical Exam ?Vitals and nursing note reviewed.  ?Constitutional:   ?   Appearance: She is well-developed.  ?HENT:  ?   Head: Normocephalic and atraumatic.  ?   Right Ear: External ear normal.  ?   Left Ear: External ear normal.  ?   Nose: Nose normal.  ?   Mouth/Throat:  ?   Mouth: Mucous membranes are moist.  ?   Pharynx: Oropharynx is clear.  ?Eyes:  ?   Conjunctiva/sclera: Conjunctivae normal.  ?Cardiovascular:  ?   Rate and Rhythm: Normal rate and regular  rhythm.  ?   Heart sounds: Normal heart sounds. No murmur heard. ?  No friction rub. No gallop.  ?Pulmonary:  ?   Effort: Pulmonary effort is normal.  ?   Breath sounds: Normal breath sounds. No wheezing or rales.  ?Musculoskeletal:  ?   Cervical back: Neck supple.  ?Skin: ?   General: Skin is warm.  ?   Findings: No rash.  ?Neurological:  ?   Mental Status: She is alert and oriented to person, place, and time.  ?Psychiatric:     ?   Behavior: Behavior normal.  ? ?Previous notes and tests were reviewed. ?The plan was reviewed with the patient/family, and all questions/concerned were addressed. ? ?It was my pleasure to see Barbara Sellers today and  participate in her care. Please feel free to contact me with any questions or concerns. ? ?Sincerely, ? ?Wyline Mood, DO ?Allergy & Immunology ? ?[facility] ?[place] office: [phone] ?[place]

## 2021-10-01 ENCOUNTER — Encounter: Payer: Self-pay | Admitting: Allergy

## 2021-10-01 ENCOUNTER — Ambulatory Visit: Payer: Managed Care, Other (non HMO) | Admitting: Allergy

## 2021-10-01 VITALS — BP 118/68 | HR 92 | Temp 97.2°F | Resp 16 | Ht 62.0 in | Wt 176.0 lb

## 2021-10-01 DIAGNOSIS — J309 Allergic rhinitis, unspecified: Secondary | ICD-10-CM | POA: Diagnosis not present

## 2021-10-01 DIAGNOSIS — H1013 Acute atopic conjunctivitis, bilateral: Secondary | ICD-10-CM

## 2021-10-01 DIAGNOSIS — J4520 Mild intermittent asthma, uncomplicated: Secondary | ICD-10-CM | POA: Diagnosis not present

## 2021-10-01 DIAGNOSIS — T50905D Adverse effect of unspecified drugs, medicaments and biological substances, subsequent encounter: Secondary | ICD-10-CM

## 2021-10-01 DIAGNOSIS — T781XXD Other adverse food reactions, not elsewhere classified, subsequent encounter: Secondary | ICD-10-CM

## 2021-10-01 DIAGNOSIS — Z8709 Personal history of other diseases of the respiratory system: Secondary | ICD-10-CM

## 2021-10-01 DIAGNOSIS — H1010 Acute atopic conjunctivitis, unspecified eye: Secondary | ICD-10-CM

## 2021-10-01 DIAGNOSIS — J4540 Moderate persistent asthma, uncomplicated: Secondary | ICD-10-CM

## 2021-10-01 NOTE — Assessment & Plan Note (Signed)
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 Tolerates other peppers including jalapenos with no issues. Last testing done in 2015. ?Interim history - accidentally had a food with small amount of cayenne pepper and only had throat itching for 30 seconds.  ?? Continue strict avoidance of cayenne pepper. ?? For mild symptoms you can take over the counter antihistamines such as Benadryl and monitor symptoms closely. If symptoms worsen or if you have severe symptoms including breathing issues, throat closure, significant swelling, whole body hives, severe diarrhea and vomiting, lightheadedness then inject epinephrine and seek immediate medical care afterwards. ?? Emergency action plan in place.  ?

## 2021-10-01 NOTE — Assessment & Plan Note (Signed)
Past history - Reactions to azithromycin and doxycycline in the past. ?? Continue to avoid.  ?? Consider drug challenges in the future.  ?

## 2021-10-01 NOTE — Assessment & Plan Note (Signed)
Past history - Perennial rhino conjunctivitis symptoms for 20+ years with worsening in the spring and summer. Other triggers include cats. 2015 skin testing in the past was positive to multiple pollens, cat, mold per patient report. On allergy injections in the 1990s with good benefit. 2021 skin testing showed: Positive to grass, ragweed. Borderline to mold and cat. Started AIT on 12/20/2019 (G-RW and M-C).  ?Interim history - doing well with below regimen with no issues. ?? Continue environmental control measures. ?? Continue allergy injections - given today. ?? Okay to build up new vial with 0.99mL, 0.55mL, 0.64mL schedule.  ?? Only take the allergy medication as needed and not daily. ?? Definitely take on the days of injections and the days when doing yardwork. ?

## 2021-10-01 NOTE — Assessment & Plan Note (Signed)
Past history - Diagnosed with asthma as a child and was doing well up until recently. She had COVID-19 in January 2021. Recently started on Incruse and using albuterol less but was using it multiple times per day. Prednisone did not help. CXR on 07/11/2019 - Small focus of airspace disease in the right lung base. 2021 spirometry was normal with no improvement in FEV1 post bronchodilator treatment. Clinically feeling the same.  ?Interim history - stopped maintenance inhaler 1 year ago due to insurance issues. No flare in symptoms. ?? Today's spirometry was normal.  ?? May use albuterol rescue inhaler 2 puffs every 4 to 6 hours as needed for shortness of breath, chest tightness, coughing, and wheezing. May use albuterol rescue inhaler 2 puffs 5 to 15 minutes prior to strenuous physical activities. Monitor frequency of use.  ?

## 2021-10-01 NOTE — Patient Instructions (Addendum)
Environmental allergies ?Past skin testing showed: Positive to grass, ragweed. Borderline to mold and cat. ?Continue environmental control measures. ?Continue allergy injections - given today. ?Only take the allergy medication as needed and not daily. ?Definitely take on the days of your injections and the days you are outdoors doing yardwork.  ? ?Asthma: ?Your breathing test was normal today. ?May use albuterol rescue inhaler 2 puffs every 4 to 6 hours as needed for shortness of breath, chest tightness, coughing, and wheezing. May use albuterol rescue inhaler 2 puffs 5 to 15 minutes prior to strenuous physical activities. Monitor frequency of use.  ?Asthma control goals:  ?Full participation in all desired activities (may need albuterol before activity) ?Albuterol use two times or less a week on average (not counting use with activity) ?Cough interfering with sleep two times or less a month ?Oral steroids no more than once a year ?No hospitalizations ? ?Food allergy: ?Continue strict avoidance of cayenne pepper. ?For mild symptoms you can take over the counter antihistamines such as Benadryl and monitor symptoms closely. If symptoms worsen or if you have severe symptoms including breathing issues, throat closure, significant swelling, whole body hives, severe diarrhea and vomiting, lightheadedness then inject epinephrine and seek immediate medical care afterwards. ?Emergency action plan in place.  ? ?Frequent infections: ?Keep track of infections. ? ?Follow up in 12 months or sooner if needed.  ? ?

## 2021-10-07 ENCOUNTER — Ambulatory Visit: Payer: Managed Care, Other (non HMO) | Admitting: Family Medicine

## 2021-10-07 ENCOUNTER — Encounter: Payer: Self-pay | Admitting: Family Medicine

## 2021-10-07 VITALS — BP 116/72 | HR 72 | Ht 62.0 in | Wt 171.0 lb

## 2021-10-07 DIAGNOSIS — F909 Attention-deficit hyperactivity disorder, unspecified type: Secondary | ICD-10-CM | POA: Insufficient documentation

## 2021-10-07 DIAGNOSIS — R635 Abnormal weight gain: Secondary | ICD-10-CM | POA: Insufficient documentation

## 2021-10-07 DIAGNOSIS — F900 Attention-deficit hyperactivity disorder, predominantly inattentive type: Secondary | ICD-10-CM | POA: Diagnosis not present

## 2021-10-07 DIAGNOSIS — R5383 Other fatigue: Secondary | ICD-10-CM | POA: Diagnosis not present

## 2021-10-07 DIAGNOSIS — F419 Anxiety disorder, unspecified: Secondary | ICD-10-CM

## 2021-10-07 NOTE — Assessment & Plan Note (Signed)
Continue regular exercise and dietary changes.  Checking labs for possible metabolic abnormalities that would make it more difficult for her to lose weight. ?

## 2021-10-07 NOTE — Assessment & Plan Note (Signed)
Anxiety and PTSD are currently managed by psychiatry.  Stable on current medications. ?

## 2021-10-07 NOTE — Assessment & Plan Note (Signed)
Stable with current medications.  Managed by psychiatry. ?

## 2021-10-07 NOTE — Progress Notes (Signed)
?Barbara Sellers - 31 y.o. female MRN [id number]  Date of birth: Jun 28, 1991 ? ?Subjective ?No chief complaint on file. ? ? ?HPI ?Barbara Sellers is a 31 year old female here today for follow-up visit.  She is a former patient of Dr. Sheppard Coil.  She has history of ADHD, PTSD, migraines.  She has recently reestablished with a new psychiatrist, Dr. De Nurse.  He has managing her medications for anxiety and PTSD as well as her ADHD medication.  She is doing well with current medications at this time. ? ?She is concerned about difficulty losing weight.  She is exercising regularly and feels like her diet is pretty clean.  Feels like her weight has had a plateau. ? ?ROS:  A comprehensive ROS was completed and negative except as noted per HPI ? ?Allergies  ?Allergen Reactions  ? Cayenne Anaphylaxis  ? Azithromycin Itching and Rash  ?  Also burning. ? ? ?Also burning. ?Also burning.  ? Doxycycline Other (See Comments) and Swelling  ?  Esophagus issues. "felt like razor blades" ?Throat swelling ? ?Other reaction(s): Other (See Comments) ?Throat swelling ?Esophagus issues. "felt like razor blades" ?Esophagus issues. "felt like razor blades" ?Throat swelling  ? Doxycycline Calcium Other (See Comments)  ? Buspar [Buspirone] Anxiety  ?  Rx caused muscle spasms  ? ? ?Past Medical History:  ?Diagnosis Date  ? ADHD (attention deficit hyperactivity disorder)   ? Asthma   ? Depression   ? Eczema   ? Family history of breast cancer   ? Family history of melanoma   ? Family history of pancreatic cancer   ? Family history of prostate cancer   ? Migraines   ? Ocular migraine 10/23/2019  ? PTSD (post-traumatic stress disorder)   ? Recurrent upper respiratory infection (URI)   ? Rheumatoid arthritis ([place])   ? Seasonal allergies   ? Swine flu 2009  ? Urticaria   ? Vertigo   ? ? ?Past Surgical History:  ?Procedure Laterality Date  ? APPENDECTOMY  1996  ? lasik Bilateral   ? ORIF DISTAL RADIUS FRACTURE    ? WISDOM TOOTH EXTRACTION    ? ? ?Social History   ? ?Socioeconomic History  ? Marital status: Married  ?  Spouse name: Not on file  ? Number of children: Not on file  ? Years of education: Not on file  ? Highest education level: Not on file  ?Occupational History  ? Occupation: student  ? Occupation: POLICE OFFICER  ?  Employer: [employer]  ?Tobacco Use  ? Smoking status: Former  ?  Types: Cigarettes  ? Smokeless tobacco: Never  ?Vaping Use  ? Vaping Use: Never used  ?Substance and Sexual Activity  ? Alcohol use: Yes  ?  Alcohol/week: 2.0 standard drinks  ?  Types: 2 Standard drinks or equivalent per week  ?  Comment: rare  ? Drug use: Never  ? Sexual activity: Yes  ?  Partners: Female  ?  Comment: Same sex marriage  ?Other Topics Concern  ? Not on file  ?Social History Narrative  ? She exercises at the gym and does kickboxing and soccer.  ? ?Social Determinants of Health  ? ?Financial Resource Strain: Not on file  ?Food Insecurity: Not on file  ?Transportation Needs: Not on file  ?Physical Activity: Not on file  ?Stress: Not on file  ?Social Connections: Not on file  ? ? ?Family History  ?Problem Relation Age of Onset  ? Macular degeneration Mother   ? Urticaria Mother   ?  Colon polyps Father   ? Diabetes Father   ? Diverticulitis Father   ? Cancer Father   ? Cancer - Other Father   ?     Adenocarcinoma  ? Allergic rhinitis Father   ? Asthma Father   ? Eczema Father   ? Allergic rhinitis Brother   ? Arrhythmia Brother   ?     half-brother  ? Allergic rhinitis Brother   ? Diabetes Maternal Aunt   ? Allergic rhinitis Maternal Aunt   ? Cancer Maternal Grandmother   ?     SKIN  ? Cancer Maternal Grandfather   ?     SKIN  ? Hypertension Paternal Grandmother   ? Skin cancer Paternal Grandmother   ? Allergic rhinitis Paternal Grandmother   ? Asthma Paternal Grandmother   ? Hypertension Paternal Grandfather   ? Cancer Paternal Grandfather   ?     melanoma, kidney, bladder, prostate, lung, liver cancer  ? Skin cancer Paternal Grandfather   ? Melanoma Paternal Aunt   ?  Allergic rhinitis Paternal Aunt   ? Asthma Paternal Aunt   ? Pancreatic cancer Other   ? Breast cancer Maternal Aunt 60  ? Allergic rhinitis Maternal Aunt   ? Pancreatic cancer Maternal Aunt   ? Allergic rhinitis Maternal Aunt   ? Allergic rhinitis Maternal Aunt   ? Asthma Maternal Aunt   ? Melanoma Paternal Aunt   ? Allergic rhinitis Paternal Aunt   ? Asthma Paternal Aunt   ? Melanoma Other   ? ? ?Health Maintenance  ?Topic Date Due  ? HIV Screening  Never done  ? Hepatitis C Screening  Never done  ? COVID-19 Vaccine (3 - Booster for Pfizer series) 12/26/2019  ? INFLUENZA VACCINE  01/26/2022  ? PAP SMEAR-Modifier  09/10/2023  ? TETANUS/TDAP  04/01/2029  ? HPV VACCINES  Aged Out  ? ? ? ?----------------------------------------------------------------------------------------------------------------------------------------------------------------------------------------------------------------- ?Physical Exam ?BP 116/72 (BP Location: Left Arm, Patient Position: Sitting, Cuff Size: Normal)   Pulse 72   Ht 5\' 2"  (1.575 m)   Wt 171 lb (77.6 kg)   SpO2 97%   BMI 31.28 kg/m?  ? ?Physical Exam ?Constitutional:   ?   Appearance: Normal appearance.  ?Eyes:  ?   General: No scleral icterus. ?Cardiovascular:  ?   Rate and Rhythm: Normal rate and regular rhythm.  ?Pulmonary:  ?   Effort: Pulmonary effort is normal.  ?   Breath sounds: Normal breath sounds.  ?Musculoskeletal:  ?   Cervical back: Neck supple.  ?Neurological:  ?   Mental Status: She is alert.  ?Psychiatric:     ?   Mood and Affect: Mood normal.     ?   Behavior: Behavior normal.  ? ? ?------------------------------------------------------------------------------------------------------------------------------------------------------------------------------------------------------------------- ?Assessment and Plan ? ?Anxiety ?Anxiety and PTSD are currently managed by psychiatry.  Stable on current medications. ? ?ADHD ?Stable with current medications.  Managed  by psychiatry. ? ?Abnormal weight gain ?Continue regular exercise and dietary changes.  Checking labs for possible metabolic abnormalities that would make it more difficult for her to lose weight. ? ? ?No orders of the defined types were placed in this encounter. ? ? ?No follow-ups on file. ? ? ? ?This visit occurred during the SARS-CoV-2 public health emergency.  Safety protocols were in place, including screening questions prior to the visit, additional usage of staff PPE, and extensive cleaning of exam room while observing appropriate contact time as indicated for disinfecting solutions.  ? ?

## 2021-10-08 ENCOUNTER — Other Ambulatory Visit: Payer: Self-pay | Admitting: Sports Medicine

## 2021-10-08 LAB — CBC WITH DIFFERENTIAL/PLATELET
Absolute Monocytes: 717 cells/uL (ref 200–950)
Basophils Absolute: 32 cells/uL (ref 0–200)
Basophils Relative: 0.3 %
Eosinophils Absolute: 128 cells/uL (ref 15–500)
Eosinophils Relative: 1.2 %
HCT: 43.8 % (ref 35.0–45.0)
Hemoglobin: 14.1 g/dL (ref 11.7–15.5)
Lymphs Abs: 3756 cells/uL (ref 850–3900)
MCH: 27.4 pg (ref 27.0–33.0)
MCHC: 32.2 g/dL (ref 32.0–36.0)
MCV: 85.2 fL (ref 80.0–100.0)
MPV: 10.1 fL (ref 7.5–12.5)
Monocytes Relative: 6.7 %
Neutro Abs: 6067 cells/uL (ref 1500–7800)
Neutrophils Relative %: 56.7 %
Platelets: 342 10*3/uL (ref 140–400)
RBC: 5.14 10*6/uL — ABNORMAL HIGH (ref 3.80–5.10)
RDW: 13 % (ref 11.0–15.0)
Total Lymphocyte: 35.1 %
WBC: 10.7 10*3/uL (ref 3.8–10.8)

## 2021-10-08 LAB — COMPLETE METABOLIC PANEL WITH GFR
AG Ratio: 1.5 (calc) (ref 1.0–2.5)
ALT: 16 U/L (ref 6–29)
AST: 17 U/L (ref 10–30)
Albumin: 4.5 g/dL (ref 3.6–5.1)
Alkaline phosphatase (APISO): 86 U/L (ref 31–125)
BUN/Creatinine Ratio: 11 (calc) (ref 6–22)
BUN: 11 mg/dL (ref 7–25)
CO2: 27 mmol/L (ref 20–32)
Calcium: 9.8 mg/dL (ref 8.6–10.2)
Chloride: 103 mmol/L (ref 98–110)
Creat: 1 mg/dL — ABNORMAL HIGH (ref 0.50–0.97)
Globulin: 3 g/dL (calc) (ref 1.9–3.7)
Glucose, Bld: 86 mg/dL (ref 65–99)
Potassium: 4 mmol/L (ref 3.5–5.3)
Sodium: 138 mmol/L (ref 135–146)
Total Bilirubin: 0.3 mg/dL (ref 0.2–1.2)
Total Protein: 7.5 g/dL (ref 6.1–8.1)
eGFR: 77 mL/min/{1.73_m2} (ref >=60)

## 2021-10-08 LAB — IRON,TIBC AND FERRITIN PANEL
%SAT: 8 % (calc) — ABNORMAL LOW (ref 16–45)
Ferritin: 16 ng/mL (ref 16–154)
Iron: 30 ug/dL — ABNORMAL LOW (ref 40–190)
TIBC: 385 mcg/dL (calc) (ref 250–450)

## 2021-10-08 LAB — TSH: TSH: 1.33 mIU/L

## 2021-10-08 LAB — VITAMIN D 25 HYDROXY (VIT D DEFICIENCY, FRACTURES): Vit D, 25-Hydroxy: 54 ng/mL (ref 30–100)

## 2021-10-08 LAB — VITAMIN B12: Vitamin B-12: 368 pg/mL (ref 200–1100)

## 2021-10-12 ENCOUNTER — Other Ambulatory Visit: Payer: Self-pay | Admitting: Family Medicine

## 2021-10-15 ENCOUNTER — Ambulatory Visit (INDEPENDENT_AMBULATORY_CARE_PROVIDER_SITE_OTHER): Payer: Managed Care, Other (non HMO)

## 2021-10-15 DIAGNOSIS — J309 Allergic rhinitis, unspecified: Secondary | ICD-10-CM

## 2021-10-20 ENCOUNTER — Other Ambulatory Visit (HOSPITAL_COMMUNITY): Payer: Self-pay | Admitting: Psychiatry

## 2021-10-22 ENCOUNTER — Ambulatory Visit: Payer: Self-pay

## 2021-10-29 ENCOUNTER — Ambulatory Visit (INDEPENDENT_AMBULATORY_CARE_PROVIDER_SITE_OTHER): Payer: Managed Care, Other (non HMO)

## 2021-10-29 DIAGNOSIS — J309 Allergic rhinitis, unspecified: Secondary | ICD-10-CM

## 2021-11-02 ENCOUNTER — Encounter (HOSPITAL_COMMUNITY): Payer: Self-pay | Admitting: Psychiatry

## 2021-11-02 ENCOUNTER — Telehealth (INDEPENDENT_AMBULATORY_CARE_PROVIDER_SITE_OTHER): Payer: 59 | Admitting: Psychiatry

## 2021-11-02 DIAGNOSIS — F411 Generalized anxiety disorder: Secondary | ICD-10-CM | POA: Diagnosis not present

## 2021-11-02 DIAGNOSIS — F5102 Adjustment insomnia: Secondary | ICD-10-CM

## 2021-11-02 DIAGNOSIS — F900 Attention-deficit hyperactivity disorder, predominantly inattentive type: Secondary | ICD-10-CM | POA: Diagnosis not present

## 2021-11-02 DIAGNOSIS — F4310 Post-traumatic stress disorder, unspecified: Secondary | ICD-10-CM

## 2021-11-02 MED ORDER — SERTRALINE HCL 50 MG PO TABS: 50.0000 mg | ORAL_TABLET | Freq: Every day | ORAL | 1 refills | Status: DC

## 2021-11-02 MED ORDER — AMPHETAMINE-DEXTROAMPHETAMINE 10 MG PO TABS: 10.0000 mg | ORAL_TABLET | Freq: Every day | ORAL | 0 refills | Status: DC

## 2021-11-02 MED ORDER — TRAZODONE HCL 50 MG PO TABS: 50.0000 mg | ORAL_TABLET | Freq: Every evening | ORAL | 1 refills | Status: DC | PRN
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
## 2021-11-05 ENCOUNTER — Ambulatory Visit: Payer: Managed Care, Other (non HMO) | Admitting: Family Medicine

## 2021-11-05 ENCOUNTER — Emergency Department
Admission: EM | Admit: 2021-11-05 | Discharge: 2021-11-05 | Disposition: A | Discharge status: Home / Self Care | Payer: Managed Care, Other (non HMO) | Source: Home / Self Care

## 2021-11-05 DIAGNOSIS — K5732 Diverticulitis of large intestine without perforation or abscess without bleeding: Secondary | ICD-10-CM

## 2021-11-05 MED ORDER — CIPROFLOXACIN HCL 500 MG PO TABS: 500.0000 mg | ORAL_TABLET | Freq: Two times a day (BID) | ORAL | 0 refills | Status: AC

## 2021-11-05 MED ORDER — METRONIDAZOLE 500 MG PO TABS: 500.0000 mg | ORAL_TABLET | Freq: Three times a day (TID) | ORAL | 0 refills | Status: AC

## 2021-11-05 NOTE — Discharge Instructions (Addendum)
Instructed patient to take medication as directed with food to completion.  Encouraged patient increase daily water intake while taking these medications.  Advised patient if symptoms worsen and/or unresolved please follow-up PCP or here for further evaluation. ?

## 2021-11-05 NOTE — ED Triage Notes (Signed)
Pt states that she has some abdominal pain. Pt states that it is mostly on the right side that radiates down her right leg. X2 days ?

## 2021-11-05 NOTE — ED Provider Notes (Signed)
?[facility] ? ? ? ?CSN: [id number] ?Arrival date & time: 11/05/21  [id number] ? ? ?  ? ?History   ?Chief Complaint ?Chief Complaint  ?Patient presents with  ? Abdominal Pain  ?  Abdominal pain. X2 days  ? ? ?HPI ?Barbara BallsMegan Puertas is a 31 y.o. female.  ? ?HPI people 31 year old female presents with abdominal pain for 2 days.  Reports mostly right-sided and radiates down right leg.  PMH is significant for acute abdominal pain of left lower quadrant and ADHD.  Patient reports being diagnosed with diverticulitis [facility].  Reports diverticulitis was confirmed by CT of abdomen and pelvis with contrast in 2021.  Patient reports today symptoms are exactly as they were when she experienced diverticulitis in 2021. ? ?Past Medical History:  ?Diagnosis Date  ? ADHD (attention deficit hyperactivity disorder)   ? Asthma   ? Depression   ? Eczema   ? Family history of breast cancer   ? Family history of melanoma   ? Family history of pancreatic cancer   ? Family history of prostate cancer   ? Migraines   ? Ocular migraine 10/23/2019  ? PTSD (post-traumatic stress disorder)   ? Recurrent upper respiratory infection (URI)   ? Rheumatoid arthritis ([facility])   ? Seasonal allergies   ? Swine flu 2009  ? Urticaria   ? Vertigo   ? ? ?Patient Active Problem List  ? Diagnosis Date Noted  ? ADHD 10/07/2021  ? Abnormal weight gain 10/07/2021  ? Mild intermittent asthma without complication 10/01/2021  ? Anxiety 09/28/2021  ? Migraine 09/28/2021  ? Hemifacial spasm of left side of face 01/15/2021  ? Acute abdominal pain in left lower quadrant 11/07/2020  ? Concussion 10/24/2020  ? Acute pain of right shoulder 09/26/2020  ? Seasonal and perennial allergic rhinoconjunctivitis 03/04/2020  ? Genetic testing 12/10/2019  ? Asthma 11/29/2019  ? Adverse food reaction 11/29/2019  ? Drug reaction 11/29/2019  ? Family history of melanoma   ? Family history of breast cancer   ? Family history of pancreatic cancer   ? Family  history of prostate cancer   ? Ocular migraine 10/23/2019  ? Cubital tunnel syndrome, left 07/19/2018  ? S/P hardware removal 10/09/2015  ? Fracture of metacarpal base of left hand, closed 11/15/2014  ? Closed fracture of distal end of left radius 11/11/2014  ? ? ?Past Surgical History:  ?Procedure Laterality Date  ? APPENDECTOMY  1996  ? lasik Bilateral   ? ORIF DISTAL RADIUS FRACTURE    ? WISDOM TOOTH EXTRACTION    ? ? ?OB History   ? ? Gravida  ?0  ? Para  ?   ? Term  ?   ? Preterm  ?   ? AB  ?   ? Living  ?   ?  ? ? SAB  ?   ? IAB  ?   ? Ectopic  ?   ? Multiple  ?   ? Live Births  ?   ?   ?  ?  ? ? ? ?Home Medications   ? ?Prior to Admission medications   ?Medication Sig Start Date End Date Taking? Authorizing Provider  ?albuterol (VENTOLIN HFA) 108 (90 Base) MCG/ACT inhaler Inhale 1-2 puffs into the lungs every 4 (four) hours as needed for wheezing or shortness of breath. 01/13/21  Yes Carlis Stableummings, Charley Elizabeth, PA-C  ?amphetamine-dextroamphetamine (ADDERALL) 10 MG tablet Take 1 tablet (10 mg total) by  mouth daily. 11/02/21 11/02/22 Yes Thresa Ross, MD  ?ciprofloxacin (CIPRO) 500 MG tablet Take 1 tablet (500 mg total) by mouth 2 (two) times daily for 10 days. 11/05/21 11/15/21 Yes Trevor Iha, FNP  ?metroNIDAZOLE (FLAGYL) 500 MG tablet Take 1 tablet (500 mg total) by mouth 3 (three) times daily for 10 days. 11/05/21 11/15/21 Yes Trevor Iha, FNP  ?pantoprazole (PROTONIX) 40 MG tablet Take 1 tablet (40 mg total) by mouth daily. 10/12/21  Yes Everrett Coombe, DO  ?sertraline (ZOLOFT) 50 MG tablet Take 1 tablet (50 mg total) by mouth daily. 11/02/21  Yes Thresa Ross, MD  ?traZODone (DESYREL) 50 MG tablet Take 1 tablet (50 mg total) by mouth at bedtime as needed. for sleep 11/02/21  Yes Thresa Ross, MD  ? ? ?Family History ?Family History  ?Problem Relation Age of Onset  ? Macular degeneration Mother   ? Urticaria Mother   ? Colon polyps Father   ? Diabetes Father   ? Diverticulitis Father   ? Cancer Father   ?  Cancer - Other Father   ?     Adenocarcinoma  ? Allergic rhinitis Father   ? Asthma Father   ? Eczema Father   ? Allergic rhinitis Brother   ? Arrhythmia Brother   ?     half-brother  ? Allergic rhinitis Brother   ? Diabetes Maternal Aunt   ? Allergic rhinitis Maternal Aunt   ? Cancer Maternal Grandmother   ?     SKIN  ? Cancer Maternal Grandfather   ?     SKIN  ? Hypertension Paternal Grandmother   ? Skin cancer Paternal Grandmother   ? Allergic rhinitis Paternal Grandmother   ? Asthma Paternal Grandmother   ? Hypertension Paternal Grandfather   ? Cancer Paternal Grandfather   ?     melanoma, kidney, bladder, prostate, lung, liver cancer  ? Skin cancer Paternal Grandfather   ? Melanoma Paternal Aunt   ? Allergic rhinitis Paternal Aunt   ? Asthma Paternal Aunt   ? Pancreatic cancer Other   ? Breast cancer Maternal Aunt 84  ? Allergic rhinitis Maternal Aunt   ? Pancreatic cancer Maternal Aunt   ? Allergic rhinitis Maternal Aunt   ? Allergic rhinitis Maternal Aunt   ? Asthma Maternal Aunt   ? Melanoma Paternal Aunt   ? Allergic rhinitis Paternal Aunt   ? Asthma Paternal Aunt   ? Melanoma Other   ? ? ?Social History ?Social History  ? ?Tobacco Use  ? Smoking status: Former  ?  Types: Cigarettes  ? Smokeless tobacco: Never  ?Vaping Use  ? Vaping Use: Never used  ?Substance Use Topics  ? Alcohol use: Yes  ?  Alcohol/week: 2.0 standard drinks  ?  Types: 2 Standard drinks or equivalent per week  ?  Comment: rare  ? Drug use: Never  ? ? ? ?Allergies   ?Cayenne, Azithromycin, Doxycycline, Doxycycline calcium, and Buspar [buspirone] ? ? ?Review of Systems ?Review of Systems  ?Gastrointestinal:  Positive for abdominal pain.  ? ? ?Physical Exam ?Triage Vital Signs ?ED Triage Vitals  ?Enc Vitals Group  ?   BP 11/05/21 1020 119/81  ?   Pulse Rate 11/05/21 1020 67  ?   Resp 11/05/21 1020 20  ?   Temp 11/05/21 1020 98.4 ?F (36.9 ?C)  ?   Temp Source 11/05/21 1020 Oral  ?   SpO2 11/05/21 1020 96 %  ?   Weight 11/05/21 1018 160 lb  (72.6 kg)  ?  Height 11/05/21 1018 5\' 1"  (1.549 m)  ?   Head Circumference --   ?   Peak Flow --   ?   Pain Score 11/05/21 1018 7  ?   Pain Loc --   ?   Pain Edu? --   ?   Excl. in GC? --   ? ?No data found. ? ?Updated Vital Signs ?BP 119/81 (BP Location: Right Arm)   Pulse 67   Temp 98.4 ?F (36.9 ?C) (Oral)   Resp 20   Ht 5\' 1"  (1.549 m)   Wt 160 lb (72.6 kg)   LMP 10/08/2021   SpO2 96%   BMI 30.23 kg/m?  ? ?Physical Exam ?Vitals and nursing note reviewed.  ?Constitutional:   ?   Appearance: She is well-developed and normal weight.  ?HENT:  ?   Head: Normocephalic and atraumatic.  ?   Mouth/Throat:  ?   Mouth: Mucous membranes are moist.  ?   Pharynx: Oropharynx is clear.  ?Eyes:  ?   Extraocular Movements: Extraocular movements intact.  ?   Pupils: Pupils are equal, round, and reactive to light.  ?Cardiovascular:  ?   Rate and Rhythm: Normal rate and regular rhythm.  ?   Heart sounds: Normal heart sounds. No murmur heard. ?Pulmonary:  ?   Effort: Pulmonary effort is normal.  ?   Breath sounds: Normal breath sounds. No wheezing, rhonchi or rales.  ?Abdominal:  ?   General: Abdomen is flat. Bowel sounds are absent.  ?   Palpations: Abdomen is soft. There is fluid wave.  ?   Tenderness: There is abdominal tenderness in the right upper quadrant, right lower quadrant, left upper quadrant and left lower quadrant. There is no guarding or rebound. Negative signs include Murphy's sign and McBurney's sign.  ?   Hernia: No hernia is present.  ?Skin: ?   General: Skin is warm and dry.  ?Neurological:  ?   General: No focal deficit present.  ?   Mental Status: She is alert and oriented to person, place, and time.  ? ? ? ?UC Treatments / Results  ?Labs ?(all labs ordered are listed, but only abnormal results are displayed) ?Labs Reviewed - No data to display ? ?EKG ? ? ?Radiology ?No results found. ? ?Procedures ?Procedures (including critical care time) ? ?Medications Ordered in UC ?Medications - No data to  display ? ?Initial Impression / Assessment and Plan / UC Course  ?I have reviewed the triage vital signs and the nursing notes. ? ?Pertinent labs & imaging results that were available during my care of the patient were revi

## 2021-11-12 ENCOUNTER — Ambulatory Visit (INDEPENDENT_AMBULATORY_CARE_PROVIDER_SITE_OTHER): Payer: Managed Care, Other (non HMO)

## 2021-11-12 DIAGNOSIS — J309 Allergic rhinitis, unspecified: Secondary | ICD-10-CM | POA: Diagnosis not present

## 2021-11-16 ENCOUNTER — Other Ambulatory Visit (HOSPITAL_COMMUNITY): Payer: Self-pay

## 2021-11-16 MED ORDER — SERTRALINE HCL 50 MG PO TABS: 50.0000 mg | ORAL_TABLET | Freq: Every day | ORAL | 0 refills | Status: DC

## 2021-11-19 ENCOUNTER — Telehealth (HOSPITAL_COMMUNITY): Payer: Self-pay

## 2021-11-19 DIAGNOSIS — F4310 Post-traumatic stress disorder, unspecified: Secondary | ICD-10-CM

## 2021-11-19 MED ORDER — TRAZODONE HCL 50 MG PO TABS: 50.0000 mg | ORAL_TABLET | Freq: Every evening | ORAL | 0 refills | Status: DC | PRN

## 2021-11-19 NOTE — Telephone Encounter (Signed)
Medication management - Message left for pt that a 90 day order for her Trazodone 50 mg had been e-scribed to her [facility] as requested and authorized by Dr. Gilmore Laroche this date.  Also, informed a 90 day Sertraline order had been sent on 11/16/21.  Patient to call back if an questions or issues with filling prescriptions as ordered.

## 2021-11-19 NOTE — Telephone Encounter (Signed)
Medication refill request - Fax from pt's [facility] on [address] for a 90 day Trazodone order.  Past prescription noted not at maintenance dosage.

## 2021-11-19 NOTE — Telephone Encounter (Signed)
Medication refill request -  Message left for patient that her message was received requesting Dr. De Nurse send in a 90 day order for her Trazodone, as this is what her insurance is requesting.  Informed message would be sent to provider.

## 2021-11-26 ENCOUNTER — Ambulatory Visit: Payer: Managed Care, Other (non HMO) | Admitting: Physician Assistant

## 2021-11-26 ENCOUNTER — Ambulatory Visit (INDEPENDENT_AMBULATORY_CARE_PROVIDER_SITE_OTHER): Payer: Managed Care, Other (non HMO) | Admitting: *Deleted

## 2021-11-26 DIAGNOSIS — J309 Allergic rhinitis, unspecified: Secondary | ICD-10-CM | POA: Diagnosis not present

## 2021-12-01 ENCOUNTER — Ambulatory Visit: Payer: Managed Care, Other (non HMO) | Admitting: Physician Assistant

## 2021-12-02 ENCOUNTER — Encounter: Payer: Self-pay | Admitting: Physician Assistant

## 2021-12-02 ENCOUNTER — Ambulatory Visit: Payer: Managed Care, Other (non HMO) | Admitting: Physician Assistant

## 2021-12-02 VITALS — BP 109/68 | HR 70 | Temp 97.8°F | Ht 61.0 in | Wt 161.0 lb

## 2021-12-02 DIAGNOSIS — R198 Other specified symptoms and signs involving the digestive system and abdomen: Secondary | ICD-10-CM

## 2021-12-02 DIAGNOSIS — K5790 Diverticulosis of intestine, part unspecified, without perforation or abscess without bleeding: Secondary | ICD-10-CM | POA: Diagnosis not present

## 2021-12-02 DIAGNOSIS — R1031 Right lower quadrant pain: Secondary | ICD-10-CM | POA: Insufficient documentation

## 2021-12-02 DIAGNOSIS — Z8719 Personal history of other diseases of the digestive system: Secondary | ICD-10-CM | POA: Diagnosis not present

## 2021-12-02 LAB — POCT URINALYSIS DIP (CLINITEK)
Bilirubin, UA: NEGATIVE
Blood, UA: NEGATIVE
Glucose, UA: NEGATIVE mg/dL
Ketones, POC UA: NEGATIVE mg/dL
Leukocytes, UA: NEGATIVE
Nitrite, UA: NEGATIVE
POC PROTEIN,UA: NEGATIVE
Spec Grav, UA: 1.02 (ref 1.010–1.025)
Urobilinogen, UA: 0.2 E.U./dL
pH, UA: 6.5 (ref 5.0–8.0)

## 2021-12-02 MED ORDER — CIPROFLOXACIN HCL 500 MG PO TABS: 500.0000 mg | ORAL_TABLET | Freq: Two times a day (BID) | ORAL | 0 refills | Status: AC

## 2021-12-02 MED ORDER — METRONIDAZOLE 500 MG PO TABS: 500.0000 mg | ORAL_TABLET | Freq: Three times a day (TID) | ORAL | 0 refills | Status: AC

## 2021-12-02 NOTE — Patient Instructions (Addendum)
Will refer to GI.   Diverticulitis  Diverticulitis is infection or inflammation of small pouches (diverticula) in the colon that form due to a condition called diverticulosis. Diverticula can trap stool (feces) and bacteria, causing infection and inflammation. Diverticulitis may cause severe stomach pain and diarrhea. It may lead to tissue damage in the colon that causes bleeding or blockage. The diverticula may also burst (rupture) and cause infected stool to enter other areas of the abdomen. What are the causes? This condition is caused by stool becoming trapped in the diverticula, which allows bacteria to grow in the diverticula. This leads to inflammation and infection. What increases the risk? You are more likely to develop this condition if you have diverticulosis. The risk increases if you: Are overweight or obese. Do not get enough exercise. Drink alcohol. Use tobacco products. Eat a diet that has a lot of red meat such as beef, pork, or lamb. Eat a diet that does not include enough fiber. High-fiber foods include fruits, vegetables, beans, nuts, and whole grains. Are over 68 years of age. What are the signs or symptoms? Symptoms of this condition may include: Pain and tenderness in the abdomen. The pain is normally located on the left side of the abdomen, but it may occur in other areas. Fever and chills. Nausea. Vomiting. Cramping. Bloating. Changes in bowel routines. Blood in your stool. How is this diagnosed? This condition is diagnosed based on: Your medical history. A physical exam. Tests to make sure there is nothing else causing your condition. These tests may include: Blood tests. Urine tests. CT scan of the abdomen. How is this treated? Most cases of this condition are mild and can be treated at home. Treatment may include: Taking over-the-counter pain medicines. Following a clear liquid diet. Taking antibiotic medicines by mouth. Resting. More severe cases  may need to be treated at a hospital. Treatment may include: Not eating or drinking. Taking prescription pain medicine. Receiving antibiotic medicines through an IV. Receiving fluids and nutrition through an IV. Surgery. When your condition is under control, your health care provider may recommend that you have a colonoscopy. This is an exam to look at the entire large intestine. During the exam, a lubricated, bendable tube is inserted into the anus and then passed into the rectum, colon, and other parts of the large intestine. A colonoscopy can show how severe your diverticula are and whether something else may be causing your symptoms. Follow these instructions at home: Medicines Take over-the-counter and prescription medicines only as told by your health care provider. These include fiber supplements, probiotics, and stool softeners. If you were prescribed an antibiotic medicine, take it as told by your health care provider. Do not stop taking the antibiotic even if you start to feel better. Ask your health care provider if the medicine prescribed to you requires you to avoid driving or using machinery. Eating and drinking  Follow a full liquid diet or another diet as directed by your health care provider. After your symptoms improve, your health care provider may tell you to change your diet. He or she may recommend that you eat a diet that contains at least 25 grams (25 g) of fiber daily. Fiber makes it easier to pass stool. Healthy sources of fiber include: Berries. One cup contains 4-8 grams of fiber. Beans or lentils. One-half cup contains 5-8 grams of fiber. Green vegetables. One cup contains 4 grams of fiber. Avoid eating red meat. General instructions Do not use any products that  contain nicotine or tobacco, such as cigarettes, e-cigarettes, and chewing tobacco. If you need help quitting, ask your health care provider. Exercise for at least 30 minutes, 3 times each week. You should  exercise hard enough to raise your heart rate and break a sweat. Keep all follow-up visits as told by your health care provider. This is important. You may need to have a colonoscopy. Contact a health care provider if: Your pain does not improve. Your bowel movements do not return to normal. Get help right away if: Your pain gets worse. Your symptoms do not get better with treatment. Your symptoms suddenly get worse. You have a fever. You vomit more than one time. You have stools that are bloody, black, or tarry. Summary Diverticulitis is infection or inflammation of small pouches (diverticula) in the colon that form due to a condition called diverticulosis. Diverticula can trap stool (feces) and bacteria, causing infection and inflammation. You are at higher risk for this condition if you have diverticulosis and you eat a diet that does not include enough fiber. Most cases of this condition are mild and can be treated at home. More severe cases may need to be treated at a hospital. When your condition is under control, your health care provider may recommend that you have an exam called a colonoscopy. This exam can show how severe your diverticula are and whether something else may be causing your symptoms. Keep all follow-up visits as told by your health care provider. This is important. This information is not intended to replace advice given to you by your health care provider. Make sure you discuss any questions you have with your health care provider. Document Revised: 03/26/2019 Document Reviewed: 03/26/2019 Elsevier Patient Education  2023 [employer].

## 2021-12-02 NOTE — Progress Notes (Signed)
Acute Office Visit  Subjective:     Patient ID: Barbara Sellers, female    DOB: 09/19/1990, 31 y.o.   MRN: [id number]  Chief Complaint  Patient presents with   Abdominal Pain    Abdominal Pain  Patient is in today for acute onset abdominal pain for the last 10 days. She reports that the pain is localized to the lower right quadrant. It is worse with palpation, and she denies any alleviating factors. She reports that onset was sudden without prodrome. She notes that it was worse last night, but is slightly better today. She reports a history of recurrent abdominal pain over the last several years. CT Abdomen and Pelvis from 11/07/20 revealed mild sigmoid diverticulosis. She has been treated for this right sided abdominal pain in the past with a combination of ciprofloxacin and metronidazole, which typically helps. Denies any change in bowel habits, blood in stool, fevers, chills, nausea, vomiting.   Review of Systems  Gastrointestinal:  Positive for abdominal pain.  All other systems reviewed and are negative.      Objective:    BP 109/68   Pulse 70   Temp 97.8 F (36.6 C) (Oral)   Ht 5\' 1"  (1.549 m)   Wt 161 lb (73 kg)   SpO2 96%   BMI 30.42 kg/m  BP Readings from Last 3 Encounters:  12/02/21 109/68  11/05/21 119/81  10/07/21 116/72   Wt Readings from Last 3 Encounters:  12/02/21 161 lb (73 kg)  11/05/21 160 lb (72.6 kg)  10/07/21 171 lb (77.6 kg)      Physical Exam Vitals reviewed.  Constitutional:      General: She is not in acute distress.    Appearance: She is well-developed. She is not ill-appearing.  HENT:     Head: Normocephalic and atraumatic.  Eyes:     General: No scleral icterus.    Extraocular Movements: Extraocular movements intact.  Cardiovascular:     Rate and Rhythm: Normal rate and regular rhythm.     Heart sounds: Normal heart sounds.  Pulmonary:     Effort: Pulmonary effort is normal.     Breath sounds: Normal breath sounds.  Abdominal:      General: Abdomen is flat. Bowel sounds are normal. There is no distension.     Palpations: Abdomen is soft.     Tenderness: There is abdominal tenderness. There is guarding. There is no right CVA tenderness or left CVA tenderness. Positive psoas sign: S/P appendectomy.     Hernia: No hernia is present.  Skin:    General: Skin is warm and dry.  Neurological:     Mental Status: She is alert and oriented to person, place, and time.  Psychiatric:        Mood and Affect: Mood normal.        Behavior: Behavior normal.    Results for orders placed or performed in visit on 12/02/21  POCT URINALYSIS DIP (CLINITEK)  Result Value Ref Range   Color, UA yellow yellow   Clarity, UA clear clear   Glucose, UA negative negative mg/dL   Bilirubin, UA negative negative   Ketones, POC UA negative negative mg/dL   Spec Grav, UA 1.020 1.010 - 1.025   Blood, UA negative negative   pH, UA 6.5 5.0 - 8.0   POC PROTEIN,UA negative negative, trace   Urobilinogen, UA 0.2 0.2 or 1.0 E.U./dL   Nitrite, UA Negative Negative   Leukocytes, UA Negative Negative  Assessment & Plan:   Shefali was seen today for abdominal pain.  Diagnoses and all orders for this visit:  Right lower quadrant abdominal pain -     POCT URINALYSIS DIP (CLINITEK) -     Ambulatory referral to Gastroenterology -     metroNIDAZOLE (FLAGYL) 500 MG tablet; Take 1 tablet (500 mg total) by mouth 3 (three) times daily for 10 days. -     ciprofloxacin (CIPRO) 500 MG tablet; Take 1 tablet (500 mg total) by mouth 2 (two) times daily for 10 days.  History of diverticulitis -     Ambulatory referral to Gastroenterology -     metroNIDAZOLE (FLAGYL) 500 MG tablet; Take 1 tablet (500 mg total) by mouth 3 (three) times daily for 10 days. -     ciprofloxacin (CIPRO) 500 MG tablet; Take 1 tablet (500 mg total) by mouth 2 (two) times daily for 10 days.  Diverticulosis -     Ambulatory referral to Gastroenterology -     metroNIDAZOLE  (FLAGYL) 500 MG tablet; Take 1 tablet (500 mg total) by mouth 3 (three) times daily for 10 days. -     ciprofloxacin (CIPRO) 500 MG tablet; Take 1 tablet (500 mg total) by mouth 2 (two) times daily for 10 days.  Abdominal guarding -     Ambulatory referral to Gastroenterology -     metroNIDAZOLE (FLAGYL) 500 MG tablet; Take 1 tablet (500 mg total) by mouth 3 (three) times daily for 10 days. -     ciprofloxacin (CIPRO) 500 MG tablet; Take 1 tablet (500 mg total) by mouth 2 (two) times daily for 10 days.   Right lower quadrant pain suspicious for diverticulitis and the in past abx combination has helped. UA negative for blood, leuks, nitrites, protein. Will prescribe Cipro and Flagyl for initial management. Prior CT showing signs of diverticulosis on the left side: however, pt is only having pain on the right side. ? IBD however pt denies any changes in stool with mucus or blood. She has had colonoscopy in 2014.  Will refer to GI for definitive evaluation.   Return if symptoms worsen or fail to improve.  Iran Planas, PA-C

## 2021-12-04 IMAGING — DX DG ABDOMEN 2V
3 series · 3 of 3 positions shown · non-contrast
Comparison: 05/08/2013 CT abdomen/pelvis

CLINICAL DATA: Left lower quadrant abdominal pain. Nausea.
Diarrhea. Hematuria. Bloody stools for 5 days.

EXAM:
ABDOMEN - 2 VIEW

[abdomen erect]
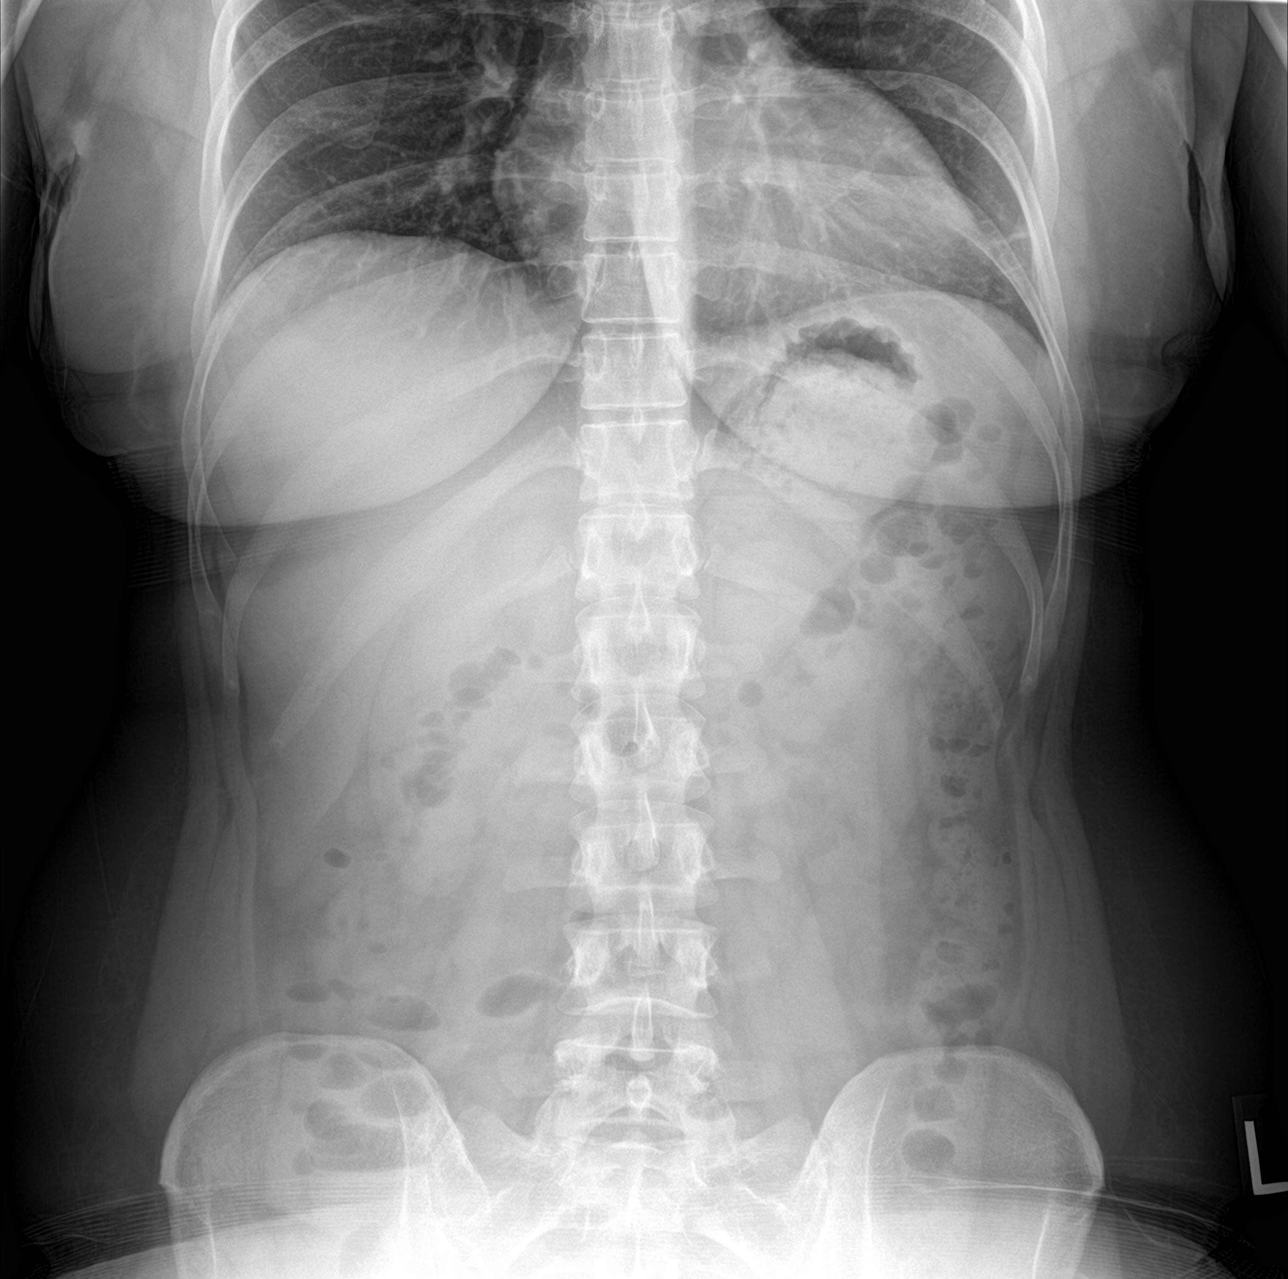

[abdomen supine (1 of 2)]
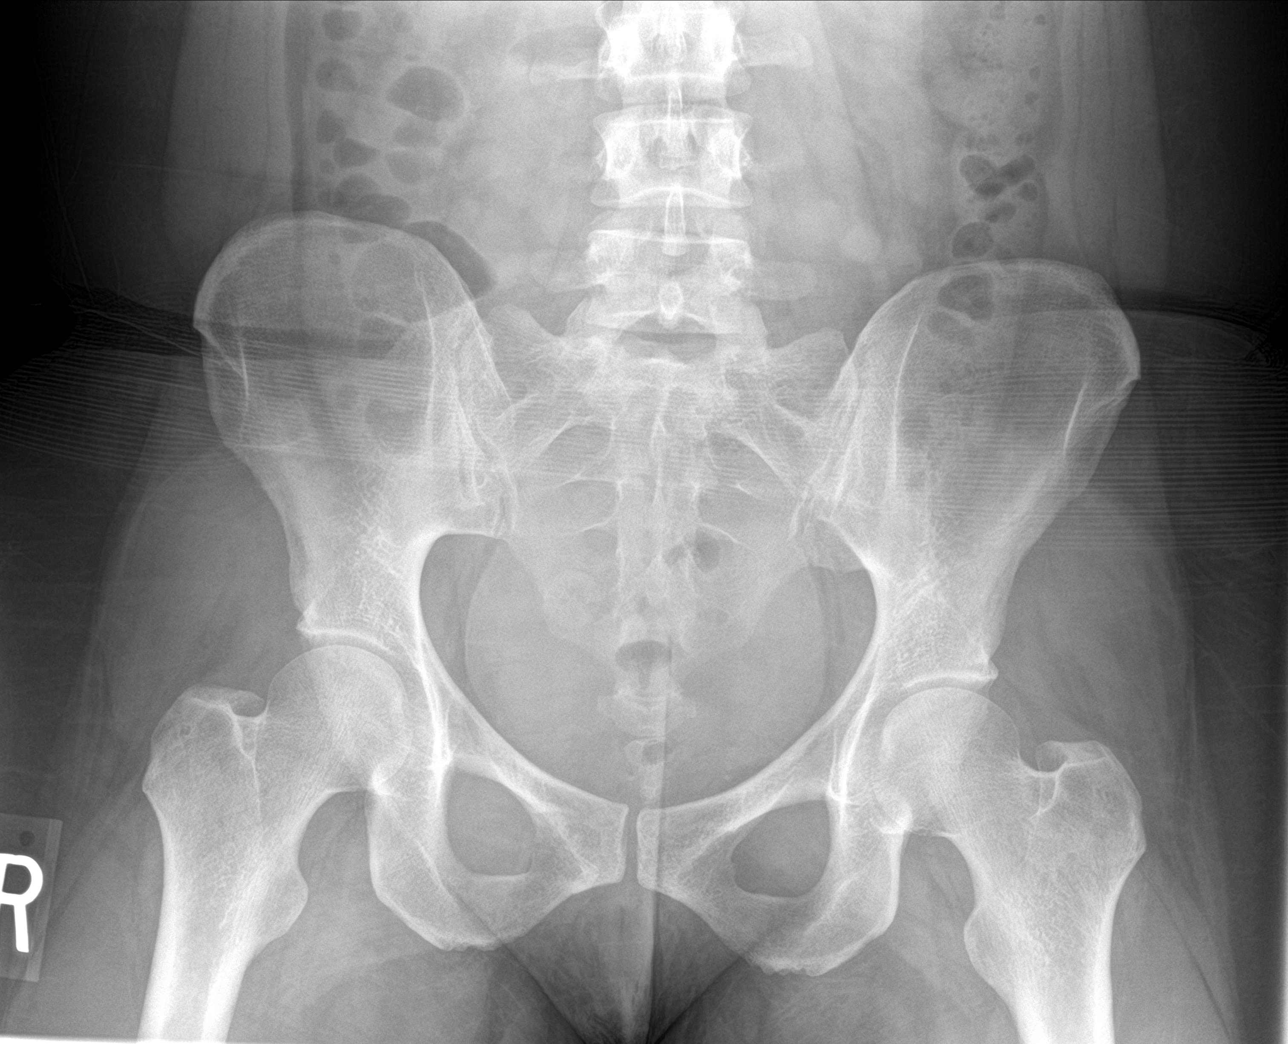

[abdomen supine (2 of 2)]
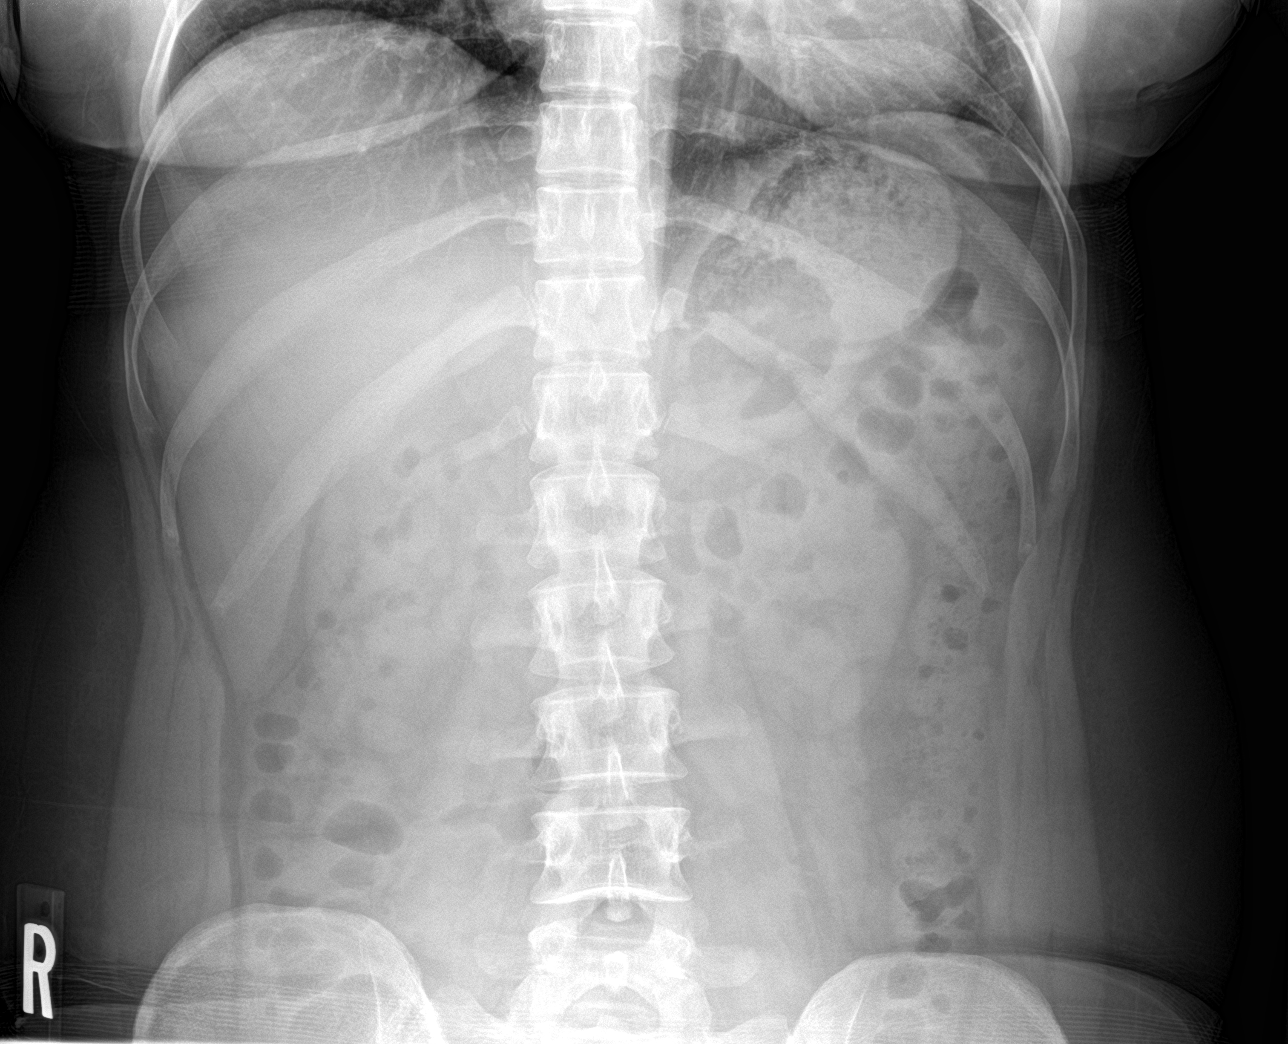

[3 of 3 positions shown; findings below may reference images not displayed]

FINDINGS: No dilated small bowel loops or air-fluid levels. Mild colonic stool
and gas. No evidence of pneumatosis or pneumoperitoneum. No
radiopaque nephrolithiasis. Clear lung bases.
IMPRESSION: Nonobstructive bowel gas pattern. No evidence of free
intraperitoneal air.

## 2021-12-10 ENCOUNTER — Ambulatory Visit (INDEPENDENT_AMBULATORY_CARE_PROVIDER_SITE_OTHER): Payer: Managed Care, Other (non HMO)

## 2021-12-10 DIAGNOSIS — J309 Allergic rhinitis, unspecified: Secondary | ICD-10-CM | POA: Diagnosis not present

## 2021-12-24 ENCOUNTER — Ambulatory Visit (INDEPENDENT_AMBULATORY_CARE_PROVIDER_SITE_OTHER): Payer: Managed Care, Other (non HMO)

## 2021-12-24 DIAGNOSIS — J309 Allergic rhinitis, unspecified: Secondary | ICD-10-CM

## 2021-12-30 ENCOUNTER — Telehealth (HOSPITAL_COMMUNITY): Payer: Self-pay

## 2021-12-30 MED ORDER — AMPHETAMINE-DEXTROAMPHETAMINE 10 MG PO TABS: 10.0000 mg | ORAL_TABLET | Freq: Every day | ORAL | 0 refills | Status: DC

## 2021-12-30 NOTE — Telephone Encounter (Signed)
Medication management - Telephone call with pt to inform Dr. Gilmore Laroche had sent in her requested new Adderall 10 mg order to her [facility].

## 2021-12-30 NOTE — Telephone Encounter (Signed)
Medication refill request - Telephone call with patient, after she left a voice message she is in need of a new Adderall 10 mg order to be [facility] [address].  Informed would send this to Dr. Gilmore Laroche.  Last prescribed 11/02/21 and patient's next appointment on 01/08/22.

## 2022-01-04 DIAGNOSIS — J3081 Allergic rhinitis due to animal (cat) (dog) hair and dander: Secondary | ICD-10-CM | POA: Diagnosis not present

## 2022-01-04 NOTE — Progress Notes (Signed)
VIALS EXP 01-05-23 

## 2022-01-07 ENCOUNTER — Ambulatory Visit: Payer: Managed Care, Other (non HMO)

## 2022-01-07 DIAGNOSIS — J309 Allergic rhinitis, unspecified: Secondary | ICD-10-CM | POA: Diagnosis not present

## 2022-01-08 ENCOUNTER — Telehealth (INDEPENDENT_AMBULATORY_CARE_PROVIDER_SITE_OTHER): Payer: 59 | Admitting: Psychiatry

## 2022-01-08 ENCOUNTER — Encounter (HOSPITAL_COMMUNITY): Payer: Self-pay | Admitting: Psychiatry

## 2022-01-08 DIAGNOSIS — F411 Generalized anxiety disorder: Secondary | ICD-10-CM

## 2022-01-08 DIAGNOSIS — F4310 Post-traumatic stress disorder, unspecified: Secondary | ICD-10-CM

## 2022-01-08 DIAGNOSIS — F900 Attention-deficit hyperactivity disorder, predominantly inattentive type: Secondary | ICD-10-CM
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
## 2022-01-21 ENCOUNTER — Ambulatory Visit: Payer: Managed Care, Other (non HMO)

## 2022-01-21 DIAGNOSIS — J309 Allergic rhinitis, unspecified: Secondary | ICD-10-CM | POA: Diagnosis not present

## 2022-01-28 ENCOUNTER — Ambulatory Visit: Payer: Managed Care, Other (non HMO)

## 2022-01-28 DIAGNOSIS — J309 Allergic rhinitis, unspecified: Secondary | ICD-10-CM | POA: Diagnosis not present

## 2022-02-02 ENCOUNTER — Ambulatory Visit (INDEPENDENT_AMBULATORY_CARE_PROVIDER_SITE_OTHER): Payer: Managed Care, Other (non HMO)

## 2022-02-02 DIAGNOSIS — J309 Allergic rhinitis, unspecified: Secondary | ICD-10-CM

## 2022-02-12 ENCOUNTER — Telehealth (HOSPITAL_COMMUNITY): Payer: Self-pay

## 2022-02-12 DIAGNOSIS — F4310 Post-traumatic stress disorder, unspecified: Secondary | ICD-10-CM

## 2022-02-12 MED ORDER — SERTRALINE HCL 100 MG PO TABS
100.0000 mg | ORAL_TABLET | Freq: Every day | ORAL | 0 refills | Status: DC
Start: 2022-02-12 — End: 2022-03-12

## 2022-02-12 MED ORDER — AMPHETAMINE-DEXTROAMPHETAMINE 10 MG PO TABS: 10.0000 mg | ORAL_TABLET | Freq: Every day | ORAL | 0 refills | Status: DC

## 2022-02-12 MED ORDER — TRAZODONE HCL 50 MG PO TABS: 50.0000 mg | ORAL_TABLET | Freq: Every evening | ORAL | 0 refills | Status: DC | PRN

## 2022-02-12 NOTE — Telephone Encounter (Signed)
Patient needs a refill on Sertraline, [facility]razodone [facility] [address] [facility] [place]. Patient says that she started taking 2 of the 50mg  Sertraline and wants to know if you can send in 100mg  this time instead of the 50mg   Next ov 10/09

## 2022-02-23 ENCOUNTER — Ambulatory Visit (INDEPENDENT_AMBULATORY_CARE_PROVIDER_SITE_OTHER): Payer: Managed Care, Other (non HMO)

## 2022-02-23 DIAGNOSIS — J309 Allergic rhinitis, unspecified: Secondary | ICD-10-CM

## 2022-03-12 ENCOUNTER — Other Ambulatory Visit (HOSPITAL_COMMUNITY): Payer: Self-pay | Admitting: Psychiatry

## 2022-03-16 ENCOUNTER — Ambulatory Visit (INDEPENDENT_AMBULATORY_CARE_PROVIDER_SITE_OTHER): Payer: Managed Care, Other (non HMO)

## 2022-03-16 DIAGNOSIS — J309 Allergic rhinitis, unspecified: Secondary | ICD-10-CM | POA: Diagnosis not present

## 2022-04-05 ENCOUNTER — Encounter (HOSPITAL_COMMUNITY): Payer: Self-pay | Admitting: Psychiatry

## 2022-04-05 ENCOUNTER — Telehealth (HOSPITAL_COMMUNITY): Payer: 59 | Admitting: Psychiatry

## 2022-04-05 DIAGNOSIS — F5102 Adjustment insomnia: Secondary | ICD-10-CM

## 2022-04-05 DIAGNOSIS — F900 Attention-deficit hyperactivity disorder, predominantly inattentive type: Secondary | ICD-10-CM

## 2022-04-05 DIAGNOSIS — F411 Generalized anxiety disorder: Secondary | ICD-10-CM | POA: Diagnosis not present

## 2022-04-05 DIAGNOSIS — F4310 Post-traumatic stress disorder, unspecified: Secondary | ICD-10-CM

## 2022-04-05 MED ORDER — TRAZODONE HCL 50 MG PO TABS: 50.0000 mg | ORAL_TABLET | Freq: Every evening | ORAL | 0 refills | Status: DC | PRN

## 2022-04-05 MED ORDER — AMPHETAMINE-DEXTROAMPHETAMINE 10 MG PO TABS: 10.0000 mg | ORAL_TABLET | Freq: Every day | ORAL | 0 refills | Status: DC

## 2022-04-05 NOTE — Progress Notes (Signed)
[facility] Follow up visit  Patient Identification: Barbara Sellers MRN:  [id number] Date of Evaluation:  04/05/2022 Referral Source: primary care Chief Complaint:   No chief complaint on file. Follow up anxiety, adhd Visit Diagnosis:    ICD-10-CM   1. Attention deficit hyperactivity disorder (ADHD), predominantly inattentive type  F90.0     2. PTSD (post-traumatic stress disorder)  F43.10 traZODone (DESYREL) 50 MG tablet    3. GAD (generalized anxiety disorder)  F41.1     4. Adjustment insomnia  F51.02     Virtual Visit via Video Note  I connected with Ardelle Balls on 04/05/22 at  9:00 AM EDT by a video enabled telemedicine application and verified that I am speaking with the correct person using two identifiers.  Location: Patient: work Provider: home office   I discussed the limitations of evaluation and management by telemedicine and the availability of in person appointments. The patient expressed understanding and agreed to proceed.      I discussed the assessment and treatment plan with the patient. The patient was provided an opportunity to ask questions and all were answered. The patient agreed with the plan and demonstrated an understanding of the instructions.   The patient was advised to call back or seek an in-person evaluation if the symptoms worsen or if the condition fails to improve as anticipated.  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  She is planning to go to college to learn cyber security.  Referred initially by primary care physician establish care with possible diagnosis of ADHD, past history of ADHD  PTSD and anxiety disorder  Doing fair, has had accident before Wife moving out, feel stress be better after that and some increase in  pay structure has helped Inattention manageable with adderall Sleep with trazadone  Going thru divorce  Patient does not no psychotic symptoms does not also endorse manic symptoms  Aggravating factors: traumatic brain injury .  Relationship difficulties,  financial concerns not happy with her job she is planning to go to school again  Modifying factors; dogs, brother Duration since age 80  Severity better   Past Psychiatric History: PTSD, Adhd  Previous Psychotropic Medications: Yes   Substance Abuse History in the last 12 months:  No.  Consequences of Substance Abuse: Drinks one a week, denies concerns  Past Medical History:  Past Medical History:  Diagnosis Date   ADHD (attention deficit hyperactivity disorder)    Asthma    Depression    Eczema    Family history of breast cancer    Family history of melanoma    Family history of pancreatic cancer    Family history of prostate cancer    Migraines    Ocular migraine 10/23/2019   PTSD (post-traumatic stress disorder)    Recurrent upper respiratory infection (URI)    Rheumatoid arthritis (HCC)    Seasonal allergies    Swine flu 2009   Urticaria    Vertigo     Past Surgical History:  Procedure Laterality Date   APPENDECTOMY  1996   lasik Bilateral    ORIF DISTAL RADIUS FRACTURE     WISDOM TOOTH EXTRACTION        Family History:  Family History  Problem Relation Age of Onset   Macular  degeneration Mother    Urticaria Mother    Colon polyps Father    Diabetes Father    Diverticulitis Father    Cancer Father    Cancer - Other Father        Adenocarcinoma   Allergic rhinitis Father    Asthma Father    Eczema Father    Allergic rhinitis Brother    Arrhythmia Brother        half-brother   Allergic rhinitis Brother    Diabetes Maternal Aunt    Allergic rhinitis Maternal Aunt    Cancer Maternal Grandmother        SKIN   Cancer Maternal Grandfather        SKIN   Hypertension Paternal Grandmother     Skin cancer Paternal Grandmother    Allergic rhinitis Paternal Grandmother    Asthma Paternal Grandmother    Hypertension Paternal Grandfather    Cancer Paternal Grandfather        melanoma, kidney, bladder, prostate, lung, liver cancer   Skin cancer Paternal Grandfather    Melanoma Paternal Aunt    Allergic rhinitis Paternal Aunt    Asthma Paternal Aunt    Pancreatic cancer Other    Breast cancer Maternal Aunt 59   Allergic rhinitis Maternal Aunt    Pancreatic cancer Maternal Aunt    Allergic rhinitis Maternal Aunt    Allergic rhinitis Maternal Aunt    Asthma Maternal Aunt    Melanoma Paternal Aunt    Allergic rhinitis Paternal Aunt    Asthma Paternal Aunt    Melanoma Other     Social History:   Social History   Socioeconomic History   Marital status: Married    Spouse name: Not on file   Number of children: Not on file   Years of education: Not on file   Highest education level: Not on file  Occupational History   Occupation: Consulting civil engineer   Occupation: POLICE OFFICER    Employer: [employer]  Tobacco Use   Smoking status: Former    Types: Cigarettes   Smokeless tobacco: Never  Vaping Use   Vaping Use: Never used  Substance and Sexual Activity   Alcohol use: Yes    Alcohol/week: 2.0 standard drinks of alcohol    Types: 2 Standard drinks or equivalent per week    Comment: rare   Drug use: Never   Sexual activity: Yes    Partners: Female    Comment: Same sex marriage  Other Topics Concern   Not on file  Social History Narrative   She exercises at the gym and does kickboxing and soccer.   Social Determinants of Health   Financial Resource Strain: Not on file  Food Insecurity: Not on file  Transportation Needs: Not on file  Physical Activity: Not on file  Stress: Not on file  Social Connections: Not on file     Allergies:   Allergies  Allergen Reactions   Cayenne Anaphylaxis   Azithromycin Itching and Rash    Also burning.   Also burning. Also  burning.   Doxycycline Other (See Comments) and Swelling    Esophagus issues. "felt like razor blades" Throat swelling  Other reaction(s): Other (See Comments) Throat swelling Esophagus issues. "felt like razor blades" Esophagus issues. "felt like razor blades" Throat swelling   Doxycycline Calcium Other (See Comments)   Buspar [Buspirone] Anxiety    Rx caused muscle spasms    Metabolic Disorder Labs: No results found for: "HGBA1C", "MPG" No results found  for: "PROLACTIN" No results found for: "CHOL", "TRIG", "HDL", "CHOLHDL", "VLDL", "LDLCALC" Lab Results  Component Value Date   TSH 1.33 10/07/2021    Therapeutic Level Labs: No results found for: "LITHIUM" No results found for: "CBMZ" No results found for: "VALPROATE"  Current Medications: Current Outpatient Medications  Medication Sig Dispense Refill   albuterol (VENTOLIN HFA) 108 (90 Base) MCG/ACT inhaler Inhale 1-2 puffs into the lungs every 4 (four) hours as needed for wheezing or shortness of breath. 8 g 1   amphetamine-dextroamphetamine (ADDERALL) 10 MG tablet Take 1 tablet (10 mg total) by mouth daily. 30 tablet 0   pantoprazole (PROTONIX) 40 MG tablet Take 1 tablet (40 mg total) by mouth daily. 90 tablet 1   sertraline (ZOLOFT) 100 MG tablet TAKE 1 TABLET BY MOUTH EVERY DAY 90 tablet 1   traZODone (DESYREL) 50 MG tablet Take 1 tablet (50 mg total) by mouth at bedtime as needed. for sleep 90 tablet 0   No current facility-administered medications for this visit.     Psychiatric Specialty Exam: Review of Systems  Cardiovascular:  Negative for chest pain.  Neurological:  Negative for tremors.  Psychiatric/Behavioral:  Negative for agitation, hallucinations and self-injury.     There were no vitals taken for this visit.There is no height or weight on file to calculate BMI.  General Appearance: Neat  Eye Contact:  Good  Speech:  Clear and Coherent  Volume:  Normal  Mood:  Euthymic  Affect:  Congruent   Thought Process:  Goal Directed  Orientation:  Full (Time, Place, and Person)  Thought Content:  Rumination  Suicidal Thoughts:  No  Homicidal Thoughts:  No  Memory:  Immediate;   Fair  Judgement:  Fair  Insight:  Fair  Psychomotor Activity:  Normal  Concentration:  Concentration: Fair  Recall:  [employer] of Knowledge:Good  Language: Good  Akathisia:  No  Handed:    AIMS (if indicated):  not done  Assets:  Communication Skills Desire for Improvement Housing  ADL's:  Intact  Cognition: WNL  Sleep:  variable   Screenings: GAD-7    Flowsheet Row Office Visit from 10/23/2019 in [facility]  Total GAD-7 Score 11      PHQ2-9    Flowsheet Row Office Visit from 09/28/2021 in [facility] [place] Office Visit from 10/23/2019 in [facility]  PHQ-2 Total Score 1 4  PHQ-9 Total Score -- 20      Flowsheet Row Video Visit from 04/05/2022 in [facility] AT [place] Video Visit from 01/08/2022 in [facility] AT [place] Video Visit from 11/02/2021 in [facility] AT [place]  C-SSRS RISK CATEGORY No Risk No Risk No Risk     Prior documentation reviewed  Assessment and Plan: as follows ADHD; stable with addrall continue Generalized anxiety disorder:manageable continue zoloft   Insomnia;fair with trazdone, will conitnue  PTSD; she feels her symptoms are manageable zoloft has helped as well Meds needed refill were sent  Coded medical complexity Fu  63m.  Collaboration of Care: Medication Management AEB med review and referral papers reviewed,   Patient/Guardian was advised Release of Information must be obtained prior to any record release in order to collaborate their care with an outside provider. Patient/Guardian was advised if they have not already done so to contact the registration department  to sign all necessary forms in order for [place] to release information  regarding their care.   Consent: Patient/Guardian gives verbal consent for treatment and assignment of benefits for services provided during this visit. Patient/Guardian expressed understanding and agreed to proceed.   Merian Capron, MD 10/9/20239:08 AM

## 2022-04-06 ENCOUNTER — Ambulatory Visit (INDEPENDENT_AMBULATORY_CARE_PROVIDER_SITE_OTHER): Payer: Managed Care, Other (non HMO)

## 2022-04-06 DIAGNOSIS — J309 Allergic rhinitis, unspecified: Secondary | ICD-10-CM

## 2022-04-08 ENCOUNTER — Other Ambulatory Visit: Payer: Self-pay | Admitting: Family Medicine

## 2022-04-08 NOTE — Telephone Encounter (Signed)
Lvm for the pt to call back to schedule an appointment for a 6 month follow up and med refill. - tvt

## 2022-04-08 NOTE — Telephone Encounter (Signed)
Please contact the patient to schedule 64-month follow-up and med refills. Thanks

## 2022-04-27 ENCOUNTER — Ambulatory Visit (INDEPENDENT_AMBULATORY_CARE_PROVIDER_SITE_OTHER): Payer: Managed Care, Other (non HMO)

## 2022-04-27 DIAGNOSIS — J309 Allergic rhinitis, unspecified: Secondary | ICD-10-CM | POA: Diagnosis not present

## 2022-05-18 ENCOUNTER — Ambulatory Visit: Payer: Managed Care, Other (non HMO)

## 2022-05-18 DIAGNOSIS — J309 Allergic rhinitis, unspecified: Secondary | ICD-10-CM | POA: Diagnosis not present

## 2022-06-02 DIAGNOSIS — J3081 Allergic rhinitis due to animal (cat) (dog) hair and dander: Secondary | ICD-10-CM | POA: Diagnosis not present

## 2022-06-02 NOTE — Progress Notes (Signed)
VIALS EXP 06-03-23 

## 2022-06-03 ENCOUNTER — Encounter: Payer: Self-pay | Admitting: Family Medicine

## 2022-06-03 ENCOUNTER — Ambulatory Visit: Payer: Managed Care, Other (non HMO) | Admitting: Family Medicine

## 2022-06-03 VITALS — BP 93/61 | HR 75 | Ht 61.0 in | Wt 133.0 lb

## 2022-06-03 DIAGNOSIS — F900 Attention-deficit hyperactivity disorder, predominantly inattentive type: Secondary | ICD-10-CM | POA: Diagnosis not present

## 2022-06-03 DIAGNOSIS — R110 Nausea: Secondary | ICD-10-CM

## 2022-06-03 DIAGNOSIS — K921 Melena: Secondary | ICD-10-CM

## 2022-06-06 DIAGNOSIS — K921 Melena: Secondary | ICD-10-CM | POA: Insufficient documentation

## 2022-06-06 DIAGNOSIS — R110 Nausea: Secondary | ICD-10-CM | POA: Insufficient documentation

## 2022-06-06 NOTE — Assessment & Plan Note (Signed)
Management per psychiatry.  Stable at this time. ?

## 2022-06-06 NOTE — Assessment & Plan Note (Signed)
Continued epigastric pain as well as postprandial nausea.  Right upper quadrant ultrasound ordered.  Additionally she has noted some blood in her stool.  Placed referral to GI.

## 2022-06-06 NOTE — Progress Notes (Signed)
Barbara Sellers - 31 y.o. female MRN [id number]  Date of birth: [birth year]  Subjective Chief Complaint  Patient presents with   Follow-up    HPI Barbara Sellers is a 31 year old female here today for follow-up visit.  She reports she is doing quite well.  Continues to see Dr. Gilmore Laroche for management of psychotropic medications including trazodone, sertraline and Adderall.  She is doing well with these medications.  Mood is stable at this time.  Continues on pantoprazole 40 mg daily.  She continues to have some postprandial nausea.  Additionally she has had some blood in her stool.  She does have some intermittent epigastric pain as well despite regular use of PPI.  ROS:  A comprehensive ROS was completed and negative except as noted per HPI   Allergies  Allergen Reactions   Cayenne Anaphylaxis   Azithromycin Itching and Rash    Also burning.   Also burning. Also burning.   Doxycycline Other (See Comments) and Swelling    Esophagus issues. "felt like razor blades" Throat swelling  Other reaction(s): Other (See Comments) Throat swelling Esophagus issues. "felt like razor blades" Esophagus issues. "felt like razor blades" Throat swelling   Doxycycline Calcium Other (See Comments)   Buspar [Buspirone] Anxiety    Rx caused muscle spasms     Past Medical History:  Diagnosis Date   ADHD (attention deficit hyperactivity disorder)    Asthma    Depression    Eczema    Family history of breast cancer    Family history of melanoma    Family history of pancreatic cancer    Family history of prostate cancer    Migraines    Ocular migraine 10/23/2019   PTSD (post-traumatic stress disorder)    Recurrent upper respiratory infection (URI)    Rheumatoid arthritis (HCC)    Seasonal allergies    Swine flu 2009   Urticaria    Vertigo     Past Surgical History:  Procedure Laterality Date   APPENDECTOMY  1996   lasik Bilateral    ORIF DISTAL RADIUS FRACTURE     WISDOM TOOTH  EXTRACTION      Social History   Socioeconomic History   Marital status: Married    Spouse name: Not on file   Number of children: Not on file   Years of education: Not on file   Highest education level: Not on file  Occupational History   Occupation: student   Occupation: POLICE OFFICER    Employer: [employer]  Tobacco Use   Smoking status: Former    Types: Cigarettes   Smokeless tobacco: Never  Vaping Use   Vaping Use: Never used  Substance and Sexual Activity   Alcohol use: Yes    Alcohol/week: 2.0 standard drinks of alcohol    Types: 2 Standard drinks or equivalent per week    Comment: rare   Drug use: Never   Sexual activity: Yes    Partners: Female    Comment: Same sex marriage  Other Topics Concern   Not on file  Social History Narrative   She exercises at the gym and does kickboxing and soccer.   Social Determinants of Health   Financial Resource Strain: Not on file  Food Insecurity: Not on file  Transportation Needs: Not on file  Physical Activity: Not on file  Stress: Not on file  Social Connections: Not on file    Family History  Problem Relation Age of Onset   Macular degeneration Mother  Urticaria Mother    Colon polyps Father    Diabetes Father    Diverticulitis Father    Cancer Father    Cancer - Other Father        Adenocarcinoma   Allergic rhinitis Father    Asthma Father    Eczema Father    Allergic rhinitis Brother    Arrhythmia Brother        half-brother   Allergic rhinitis Brother    Diabetes Maternal Aunt    Allergic rhinitis Maternal Aunt    Cancer Maternal Grandmother        SKIN   Cancer Maternal Grandfather        SKIN   Hypertension Paternal Grandmother    Skin cancer Paternal Grandmother    Allergic rhinitis Paternal Grandmother    Asthma Paternal Grandmother    Hypertension Paternal Grandfather    Cancer Paternal Grandfather        melanoma, kidney, bladder, prostate, lung, liver cancer   Skin cancer Paternal  Grandfather    Melanoma Paternal Aunt    Allergic rhinitis Paternal Aunt    Asthma Paternal Aunt    Pancreatic cancer Other    Breast cancer Maternal Aunt 59   Allergic rhinitis Maternal Aunt    Pancreatic cancer Maternal Aunt    Allergic rhinitis Maternal Aunt    Allergic rhinitis Maternal Aunt    Asthma Maternal Aunt    Melanoma Paternal Aunt    Allergic rhinitis Paternal Aunt    Asthma Paternal Aunt    Melanoma Other     Health Maintenance  Topic Date Due   HIV Screening  Never done   Hepatitis C Screening  Never done   COVID-19 Vaccine (3 - 2023-24 season) 07/29/2022 (Originally 02/26/2022)   INFLUENZA VACCINE  09/26/2022 (Originally 01/26/2022)   PAP SMEAR-Modifier  09/10/2023   DTaP/Tdap/Td (2 - Td or Tdap) 04/01/2029   HPV VACCINES  Aged Out     ----------------------------------------------------------------------------------------------------------------------------------------------------------------------------------------------------------------- Physical Exam BP 93/61 (BP Location: Left Arm, Patient Position: Sitting, Cuff Size: Normal)   Pulse 75   Ht 5\' 1"  (1.549 m)   Wt 133 lb (60.3 kg)   SpO2 96%   BMI 25.13 kg/m   Physical Exam Constitutional:      Appearance: Normal appearance.  HENT:     Head: Normocephalic and atraumatic.  Eyes:     General: No scleral icterus. Neurological:     Mental Status: She is alert.  Psychiatric:        Mood and Affect: Mood normal.        Behavior: Behavior normal.     ------------------------------------------------------------------------------------------------------------------------------------------------------------------------------------------------------------------- Assessment and Plan  ADHD Management per psychiatry.  Stable at this time.  Postprandial nausea Continued epigastric pain as well as postprandial nausea.  Right upper quadrant ultrasound ordered.  Additionally she has noted some blood in her  stool.  Placed referral to GI.   No orders of the defined types were placed in this encounter.   No follow-ups on file.    This visit occurred during the SARS-CoV-2 public health emergency.  Safety protocols were in place, including screening questions prior to the visit, additional usage of staff PPE, and extensive cleaning of exam room while observing appropriate contact time as indicated for disinfecting solutions.

## 2022-06-08 ENCOUNTER — Ambulatory Visit (INDEPENDENT_AMBULATORY_CARE_PROVIDER_SITE_OTHER): Payer: Managed Care, Other (non HMO)

## 2022-06-08 DIAGNOSIS — J309 Allergic rhinitis, unspecified: Secondary | ICD-10-CM

## 2022-06-25 ENCOUNTER — Telehealth (HOSPITAL_COMMUNITY): Payer: Self-pay | Admitting: Psychiatry

## 2022-06-25 DIAGNOSIS — F900 Attention-deficit hyperactivity disorder, predominantly inattentive type: Secondary | ICD-10-CM

## 2022-06-25 MED ORDER — AMPHETAMINE-DEXTROAMPHETAMINE 10 MG PO TABS: 10.0000 mg | ORAL_TABLET | Freq: Every day | ORAL | 0 refills | Status: DC

## 2022-06-25 NOTE — Telephone Encounter (Signed)
I have sent limited supply of Adderall to pharmacy[facility].  Will route this message to Dr. Gilmore Laroche to address once back.

## 2022-06-25 NOTE — Telephone Encounter (Signed)
Patient called requesting refill of:  amphetamine-dextroamphetamine (ADDERALL) 10 MG tablet   [id number] - [place], [place] - [address] (Ph: [phone])   Last ordered: 04/05/2022 - 30 tablets  Last visit: 04/05/2022  Next Visit: 07/07/2022

## 2022-06-29 ENCOUNTER — Ambulatory Visit (INDEPENDENT_AMBULATORY_CARE_PROVIDER_SITE_OTHER): Payer: Managed Care, Other (non HMO) | Admitting: *Deleted

## 2022-06-29 DIAGNOSIS — J309 Allergic rhinitis, unspecified: Secondary | ICD-10-CM | POA: Diagnosis not present

## 2022-07-07 ENCOUNTER — Encounter (HOSPITAL_COMMUNITY): Payer: Self-pay | Admitting: Psychiatry

## 2022-07-07 ENCOUNTER — Telehealth (INDEPENDENT_AMBULATORY_CARE_PROVIDER_SITE_OTHER): Payer: 59 | Admitting: Psychiatry

## 2022-07-07 DIAGNOSIS — F900 Attention-deficit hyperactivity disorder, predominantly inattentive type: Secondary | ICD-10-CM | POA: Diagnosis not present

## 2022-07-07 DIAGNOSIS — F411 Generalized anxiety disorder: Secondary | ICD-10-CM | POA: Diagnosis not present

## 2022-07-07 DIAGNOSIS — F4310 Post-traumatic stress disorder, unspecified: Secondary | ICD-10-CM | POA: Diagnosis not present

## 2022-07-07 DIAGNOSIS — F5102 Adjustment insomnia: Secondary | ICD-10-CM

## 2022-07-07 MED ORDER — AMPHETAMINE-DEXTROAMPHETAMINE 10 MG PO TABS: 10.0000 mg | ORAL_TABLET | Freq: Every day | ORAL | 0 refills | Status: DC

## 2022-07-07 MED ORDER — TRAZODONE HCL 50 MG PO TABS: 75.0000 mg | ORAL_TABLET | Freq: Every evening | ORAL | 1 refills | Status: DC | PRN

## 2022-07-07 MED ORDER — SERTRALINE HCL 25 MG PO TABS: 25.0000 mg | ORAL_TABLET | Freq: Every day | ORAL | 1 refills | Status: DC

## 2022-07-07 NOTE — Progress Notes (Signed)
[facility] Follow up visit  Patient Identification: Barbara Sellers MRN:  [id number] Date of Evaluation:  07/07/2022 Referral Source: primary care Chief Complaint:   No chief complaint on file. Follow up anxiety, adhd Visit Diagnosis:    ICD-10-CM   1. PTSD (post-traumatic stress disorder)  F43.10 traZODone (DESYREL) 50 MG tablet    2. GAD (generalized anxiety disorder)  F41.1     3. Attention deficit hyperactivity disorder (ADHD), predominantly inattentive type  F90.0 amphetamine-dextroamphetamine (ADDERALL) 10 MG tablet    4. Adjustment insomnia  F51.02       Virtual Visit via Video Note  I connected with Barbara Sellers on 07/07/22 at 10:15 AM by a video enabled telemedicine application and verified that I am speaking with the correct person using two identifiers.  Location: Patient: car Provider: home office   I discussed the limitations of evaluation and management by telemedicine and the availability of in person appointments. The patient expressed understanding and agreed to proceed.       I discussed the assessment and treatment plan with the patient. The patient was provided an opportunity to ask questions and all were answered. The patient agreed with the plan and demonstrated an understanding of the instructions.   The patient was advised to call back or seek an in-person evaluation if the symptoms worsen or if the condition fails to improve as anticipated.  I provided 15 -20  minutes of non-face-to-face time during this encounter.      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  She is planning to go to college to learn cyber security.   Overall doing teaching and training Divorce stressful, money wise and finances, also has to see neurology to review seizure concern if any with TBI  Inattention ok with adderall, not takes it regularly Has some sleep concerns  altought trazadone helping Stress at times high due to circumstances  Going thru divorce  Patient does not no psychotic symptoms does not also endorse manic symptoms  Aggravating factors: traumatic brain injury .  Relationship difficulties,  financial concerns not happy with her job she is planning to go to school again  Modifying factors; dogs, brother Duration since age 50  Severity better   Past Psychiatric History: PTSD, Adhd  Previous Psychotropic Medications: Yes   Substance Abuse History in the last 12 months:  No.  Consequences of Substance Abuse: Drinks one a week, denies concerns  Past Medical History:  Past Medical History:  Diagnosis Date   ADHD (attention deficit hyperactivity disorder)    Asthma    Depression    Eczema    Family history of breast cancer    Family history of melanoma    Family history of pancreatic cancer    Family history of prostate cancer    Migraines    Ocular migraine 10/23/2019   PTSD (post-traumatic stress disorder)    Recurrent upper respiratory infection (URI)    Rheumatoid arthritis (HCC)    Seasonal allergies    Swine flu 2009   Urticaria    Vertigo     Past Surgical History:  Procedure Laterality Date   APPENDECTOMY  1996   lasik Bilateral    ORIF DISTAL RADIUS FRACTURE     WISDOM TOOTH EXTRACTION        Family History:  Family History  Problem Relation Age of Onset   Macular degeneration Mother  Urticaria Mother    Colon polyps Father    Diabetes Father    Diverticulitis Father    Cancer Father    Cancer - Other Father        Adenocarcinoma   Allergic rhinitis Father    Asthma Father    Eczema Father    Allergic rhinitis Brother    Arrhythmia Brother        half-brother   Allergic rhinitis Brother    Diabetes Maternal Aunt    Allergic rhinitis Maternal Aunt    Cancer Maternal Grandmother        SKIN   Cancer Maternal Grandfather        SKIN   Hypertension Paternal Grandmother    Skin cancer  Paternal Grandmother    Allergic rhinitis Paternal Grandmother    Asthma Paternal Grandmother    Hypertension Paternal Grandfather    Cancer Paternal Grandfather        melanoma, kidney, bladder, prostate, lung, liver cancer   Skin cancer Paternal Grandfather    Melanoma Paternal Aunt    Allergic rhinitis Paternal Aunt    Asthma Paternal Aunt    Pancreatic cancer Other    Breast cancer Maternal Aunt 59   Allergic rhinitis Maternal Aunt    Pancreatic cancer Maternal Aunt    Allergic rhinitis Maternal Aunt    Allergic rhinitis Maternal Aunt    Asthma Maternal Aunt    Melanoma Paternal Aunt    Allergic rhinitis Paternal Aunt    Asthma Paternal Aunt    Melanoma Other     Social History:   Social History   Socioeconomic History   Marital status: Legally Separated    Spouse name: Not on file   Number of children: Not on file   Years of education: Not on file   Highest education level: Not on file  Occupational History   Occupation: Ship broker   Occupation: POLICE OFFICER    Employer: [employer]  Tobacco Use   Smoking status: Former    Types: Cigarettes   Smokeless tobacco: Never  Vaping Use   Vaping Use: Never used  Substance and Sexual Activity   Alcohol use: Yes    Alcohol/week: 2.0 standard drinks of alcohol    Types: 2 Standard drinks or equivalent per week    Comment: rare   Drug use: Never   Sexual activity: Yes    Partners: Female    Comment: Same sex marriage  Other Topics Concern   Not on file  Social History Narrative   She exercises at the gym and does kickboxing and soccer.   Social Determinants of Health   Financial Resource Strain: Not on file  Food Insecurity: Not on file  Transportation Needs: Not on file  Physical Activity: Not on file  Stress: Not on file  Social Connections: Not on file     Allergies:   Allergies  Allergen Reactions   Cayenne Anaphylaxis   Azithromycin Itching and Rash    Also burning.   Also burning. Also  burning.   Doxycycline Other (See Comments) and Swelling    Esophagus issues. "felt like razor blades" Throat swelling  Other reaction(s): Other (See Comments) Throat swelling Esophagus issues. "felt like razor blades" Esophagus issues. "felt like razor blades" Throat swelling   Doxycycline Calcium Other (See Comments)   Buspar [Buspirone] Anxiety    Rx caused muscle spasms    Metabolic Disorder Labs: No results found for: "HGBA1C", "MPG" No results found for: "PROLACTIN" No results  found for: "CHOL", "TRIG", "HDL", "CHOLHDL", "VLDL", "LDLCALC" Lab Results  Component Value Date   TSH 1.33 10/07/2021    Therapeutic Level Labs: No results found for: "LITHIUM" No results found for: "CBMZ" No results found for: "VALPROATE"  Current Medications: Current Outpatient Medications  Medication Sig Dispense Refill   sertraline (ZOLOFT) 25 MG tablet Take 1 tablet (25 mg total) by mouth daily. 30 tablet 1   albuterol (VENTOLIN HFA) 108 (90 Base) MCG/ACT inhaler Inhale 1-2 puffs into the lungs every 4 (four) hours as needed for wheezing or shortness of breath. 8 g 1   amphetamine-dextroamphetamine (ADDERALL) 10 MG tablet Take 1 tablet (10 mg total) by mouth daily. 30 tablet 0   pantoprazole (PROTONIX) 40 MG tablet TAKE 1 TABLET BY MOUTH EVERY DAY 90 tablet 1   sertraline (ZOLOFT) 100 MG tablet TAKE 1 TABLET BY MOUTH EVERY DAY 90 tablet 1   traZODone (DESYREL) 50 MG tablet Take 1.5 tablets (75 mg total) by mouth at bedtime as needed. for sleep 45 tablet 1   No current facility-administered medications for this visit.     Psychiatric Specialty Exam: Review of Systems  Cardiovascular:  Negative for chest pain.  Neurological:  Negative for tremors.  Psychiatric/Behavioral:  Negative for agitation, hallucinations and self-injury. The patient is nervous/anxious.     There were no vitals taken for this visit.There is no height or weight on file to calculate BMI.  General Appearance: Neat   Eye Contact:  Good  Speech:  Clear and Coherent  Volume:  Normal  Mood:  stressed  Affect:  Congruent  Thought Process:  Goal Directed  Orientation:  Full (Time, Place, and Person)  Thought Content:  Rumination  Suicidal Thoughts:  No  Homicidal Thoughts:  No  Memory:  Immediate;   Fair  Judgement:  Fair  Insight:  Fair  Psychomotor Activity:  Normal  Concentration:  Concentration: Fair  Recall:  [employer] of Knowledge:Good  Language: Good  Akathisia:  No  Handed:    AIMS (if indicated):  not done  Assets:  Communication Skills Desire for Improvement Housing  ADL's:  Intact  Cognition: WNL  Sleep:  variable   Screenings: GAD-7    Flowsheet Row Office Visit from 10/23/2019 in [place]  Total GAD-7 Score 11      PHQ2-9    [place] Office Visit from 09/28/2021 in [place] Office Visit from 10/23/2019 in [place]  PHQ-2 Total Score 1 4  PHQ-9 Total Score -- 20      Flowsheet Row Video Visit from 04/05/2022 in [place] Video Visit from 01/08/2022 in [place] Video Visit from 11/02/2021 in [place] No Risk No Risk No Risk       Prior documentation reviewed   Assessment and Plan: as follows ADHD; fair on adderall, no side effects Generalized anxiety disorder:stressed, increase zoloft to 125mg .    Insomnia; irregular at times, increase trazadone to 75mg   PTSD; fair, continue zoloft and dose increased Going thru divorce stress, has therapist she continues Fu 33m.  Collaboration of Care: Medication Management AEB med review and referral papers reviewed,   Patient/Guardian was advised Release of Information must be obtained prior to any record release in order to collaborate their care with an  outside provider. Patient/Guardian was advised if they have  not already done so to contact the registration department to sign all necessary forms in order for [place] to release information regarding their care.   Consent: Patient/Guardian gives verbal consent for treatment and assignment of benefits for services provided during this visit. Patient/Guardian expressed understanding and agreed to proceed.   Thresa Ross, MD 1/10/202410:28 AM

## 2022-07-20 ENCOUNTER — Ambulatory Visit (INDEPENDENT_AMBULATORY_CARE_PROVIDER_SITE_OTHER): Payer: Managed Care, Other (non HMO)

## 2022-07-20 DIAGNOSIS — J309 Allergic rhinitis, unspecified: Secondary | ICD-10-CM | POA: Diagnosis not present

## 2022-07-29 ENCOUNTER — Ambulatory Visit: Payer: Managed Care, Other (non HMO)

## 2022-07-29 DIAGNOSIS — J309 Allergic rhinitis, unspecified: Secondary | ICD-10-CM | POA: Diagnosis not present

## 2022-08-03 ENCOUNTER — Ambulatory Visit (INDEPENDENT_AMBULATORY_CARE_PROVIDER_SITE_OTHER): Payer: Managed Care, Other (non HMO)

## 2022-08-03 DIAGNOSIS — J309 Allergic rhinitis, unspecified: Secondary | ICD-10-CM

## 2022-08-06 ENCOUNTER — Other Ambulatory Visit (HOSPITAL_COMMUNITY): Payer: Self-pay | Admitting: Psychiatry

## 2022-08-06 DIAGNOSIS — F4310 Post-traumatic stress disorder, unspecified: Secondary | ICD-10-CM

## 2022-08-13 ENCOUNTER — Telehealth (HOSPITAL_COMMUNITY): Payer: Self-pay

## 2022-08-13 DIAGNOSIS — F900 Attention-deficit hyperactivity disorder, predominantly inattentive type: Secondary | ICD-10-CM

## 2022-08-13 MED ORDER — AMPHETAMINE-DEXTROAMPHETAMINE 10 MG PO TABS: 10.0000 mg | ORAL_TABLET | Freq: Every day | ORAL | 0 refills | Status: DC

## 2022-08-13 NOTE — Telephone Encounter (Signed)
Medication refill - Call message from patient requesting Dr. De Nurse send in a new Adderall 10 mg order [facility] [address]. Medication order last provided 07/07/22 and pt returns next on 09/08/22.

## 2022-08-13 NOTE — Telephone Encounter (Signed)
Medicaiton management - message left for patient that Dr. De Nurse had sent in her requested new Adderall 10 mg [facility] [address] and requested patient call this nurse back if any questions,

## 2022-08-26 ENCOUNTER — Ambulatory Visit (INDEPENDENT_AMBULATORY_CARE_PROVIDER_SITE_OTHER): Payer: Managed Care, Other (non HMO)

## 2022-08-26 DIAGNOSIS — J309 Allergic rhinitis, unspecified: Secondary | ICD-10-CM | POA: Diagnosis not present

## 2022-09-08 ENCOUNTER — Telehealth (HOSPITAL_COMMUNITY): Payer: 59 | Admitting: Psychiatry

## 2022-09-12 ENCOUNTER — Other Ambulatory Visit (HOSPITAL_COMMUNITY): Payer: Self-pay | Admitting: Psychiatry

## 2022-09-14 ENCOUNTER — Ambulatory Visit (INDEPENDENT_AMBULATORY_CARE_PROVIDER_SITE_OTHER): Payer: Managed Care, Other (non HMO)

## 2022-09-14 DIAGNOSIS — J309 Allergic rhinitis, unspecified: Secondary | ICD-10-CM | POA: Diagnosis not present

## 2022-09-20 ENCOUNTER — Telehealth (HOSPITAL_COMMUNITY): Payer: 59 | Admitting: Psychiatry

## 2022-09-20 ENCOUNTER — Encounter (HOSPITAL_COMMUNITY): Payer: Self-pay | Admitting: Psychiatry

## 2022-09-20 DIAGNOSIS — F4310 Post-traumatic stress disorder, unspecified: Secondary | ICD-10-CM | POA: Diagnosis not present

## 2022-09-20 DIAGNOSIS — F411 Generalized anxiety disorder: Secondary | ICD-10-CM | POA: Diagnosis not present

## 2022-09-20 DIAGNOSIS — F900 Attention-deficit hyperactivity disorder, predominantly inattentive type: Secondary | ICD-10-CM

## 2022-09-20 DIAGNOSIS — F5102 Adjustment insomnia: Secondary | ICD-10-CM

## 2022-09-20 NOTE — Progress Notes (Signed)
[facility] Follow up visit  Patient Identification: Barbara Sellers MRN:  [id number] Date of Evaluation:  09/20/2022 Referral Source: primary care Chief Complaint:   Follow up adhd, anxiety   Visit Diagnosis:    ICD-10-CM   1. GAD (generalized anxiety disorder)  F41.1     2. PTSD (post-traumatic stress disorder)  F43.10     3. Attention deficit hyperactivity disorder (ADHD), predominantly inattentive type  F90.0     4. Adjustment insomnia  F51.02     Virtual Visit via Video Note  I connected with Barbara Sellers on 09/20/22 at  3:30 PM EDT by a video enabled telemedicine application and verified that I am speaking with the correct person using two identifiers.  Location: Patient: work Provider: home office   I discussed the limitations of evaluation and management by telemedicine and the availability of in person appointments. The patient expressed understanding and agreed to proceed.    I discussed the assessment and treatment plan with the patient. The patient was provided an opportunity to ask questions and all were answered. The patient agreed with the plan and demonstrated an understanding of the instructions.   The patient was advised to call back or seek an in-person evaluation if the symptoms worsen or if the condition fails to improve as anticipated.  I provided 20 minutes of non-face-to-face time during this encounter.      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  She is planning to go to college to learn cyber security.   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 Zoloft increase has helped   Going thru divorce  Patient does not no psychotic symptoms does not also endorse manic symptoms  Aggravating factors: traumatic brain injury .   Divorce, finances Modifying factors;dogs  Duration since age 59  Severity  stressed but working thru    Past Psychiatric History: PTSD, Adhd  Previous Psychotropic Medications: Yes   Substance Abuse History in the last 12 months:  No.  Consequences of Substance Abuse: Drinks one a week, denies concerns  Past Medical History:  Past Medical History:  Diagnosis Date   ADHD (attention deficit hyperactivity disorder)    Asthma    Depression    Eczema    Family history of breast cancer    Family history of melanoma    Family history of pancreatic cancer    Family history of prostate cancer    Migraines    Ocular migraine 10/23/2019   PTSD (post-traumatic stress disorder)    Recurrent upper respiratory infection (URI)    Rheumatoid arthritis ([place])    Seasonal allergies    Swine flu 2009   Urticaria    Vertigo     Past Surgical History:  Procedure Laterality Date   APPENDECTOMY  1996   lasik Bilateral    ORIF DISTAL RADIUS FRACTURE     WISDOM TOOTH EXTRACTION        Family History:  Family History  Problem Relation Age of Onset   Macular degeneration Mother    Urticaria Mother    Colon polyps Father    Diabetes Father    Diverticulitis Father    Cancer Father    Cancer - Other Father        Adenocarcinoma   Allergic rhinitis Father  Asthma Father    Eczema Father    Allergic rhinitis Brother    Arrhythmia Brother        half-brother   Allergic rhinitis Brother    Diabetes Maternal Aunt    Allergic rhinitis Maternal Aunt    Cancer Maternal Grandmother        SKIN   Cancer Maternal Grandfather        SKIN   Hypertension Paternal Grandmother    Skin cancer Paternal Grandmother    Allergic rhinitis Paternal Grandmother    Asthma Paternal Grandmother    Hypertension Paternal Grandfather    Cancer Paternal Grandfather        melanoma, kidney, bladder, prostate, lung, liver cancer   Skin cancer Paternal Grandfather    Melanoma Paternal Aunt     Allergic rhinitis Paternal Aunt    Asthma Paternal Aunt    Pancreatic cancer Other    Breast cancer Maternal Aunt 59   Allergic rhinitis Maternal Aunt    Pancreatic cancer Maternal Aunt    Allergic rhinitis Maternal Aunt    Allergic rhinitis Maternal Aunt    Asthma Maternal Aunt    Melanoma Paternal Aunt    Allergic rhinitis Paternal Aunt    Asthma Paternal Aunt    Melanoma Other     Social History:   Social History   Socioeconomic History   Marital status: Legally Separated    Spouse name: Not on file   Number of children: Not on file   Years of education: Not on file   Highest education level: Not on file  Occupational History   Occupation: Ship broker   Occupation: POLICE OFFICER    Employer: [employer]  Tobacco Use   Smoking status: Former    Types: Cigarettes   Smokeless tobacco: Never  Vaping Use   Vaping Use: Never used  Substance and Sexual Activity   Alcohol use: Yes    Alcohol/week: 2.0 standard drinks of alcohol    Types: 2 Standard drinks or equivalent per week    Comment: rare   Drug use: Never   Sexual activity: Yes    Partners: Female    Comment: Same sex marriage  Other Topics Concern   Not on file  Social History Narrative   She exercises at the gym and does kickboxing and soccer.   Social Determinants of Health   Financial Resource Strain: Not on file  Food Insecurity: Not on file  Transportation Needs: Not on file  Physical Activity: Not on file  Stress: Not on file  Social Connections: Not on file     Allergies:   Allergies  Allergen Reactions   Cayenne Anaphylaxis   Azithromycin Itching and Rash    Also burning.   Also burning. Also burning.   Doxycycline Other (See Comments) and Swelling    Esophagus issues. "felt like razor blades" Throat swelling  Other reaction(s): Other (See Comments) Throat swelling Esophagus issues. "felt like razor blades" Esophagus issues. "felt like razor blades" Throat swelling   Doxycycline  Calcium Other (See Comments)   Buspar [Buspirone] Anxiety    Rx caused muscle spasms    Metabolic Disorder Labs: No results found for: "HGBA1C", "MPG" No results found for: "PROLACTIN" No results found for: "CHOL", "TRIG", "HDL", "CHOLHDL", "VLDL", "LDLCALC" Lab Results  Component Value Date   TSH 1.33 10/07/2021    Therapeutic Level Labs: No results found for: "LITHIUM" No results found for: "CBMZ" No results found for: "VALPROATE"  Current Medications: Current Outpatient Medications  Medication Sig Dispense Refill   albuterol (VENTOLIN HFA) 108 (90 Base) MCG/ACT inhaler Inhale 1-2 puffs into the lungs every 4 (four) hours as needed for wheezing or shortness of breath. 8 g 1   amphetamine-dextroamphetamine (ADDERALL) 10 MG tablet Take 1 tablet (10 mg total) by mouth daily. 30 tablet 0   pantoprazole (PROTONIX) 40 MG tablet TAKE 1 TABLET BY MOUTH EVERY DAY 90 tablet 1   sertraline (ZOLOFT) 100 MG tablet TAKE 1 TABLET BY MOUTH EVERY DAY 90 tablet 0   sertraline (ZOLOFT) 25 MG tablet Take 1 tablet (25 mg total) by mouth daily. 90 tablet 0   traZODone (DESYREL) 50 MG tablet TAKE 1.5 TABLETS BY MOUTH AT BEDTIME AS NEEDED. FOR SLEEP *INS REQUIRES A 90 DAY SUPPLY* 135 tablet 1   No current facility-administered medications for this visit.     Psychiatric Specialty Exam: Review of Systems  Cardiovascular:  Negative for chest pain.  Neurological:  Negative for tremors.  Psychiatric/Behavioral:  Negative for agitation, hallucinations and self-injury. The patient is nervous/anxious.     There were no vitals taken for this visit.There is no height or weight on file to calculate BMI.  General Appearance: Neat  Eye Contact:  Good  Speech:  Clear and Coherent  Volume:  Normal  Mood:  stressed  Affect:  Congruent  Thought Process:  Goal Directed  Orientation:  Full (Time, Place, and Person)  Thought Content:  Rumination  Suicidal Thoughts:  No  Homicidal Thoughts:  No  Memory:   Immediate;   Fair  Judgement:  Fair  Insight:  Fair  Psychomotor Activity:  Normal  Concentration:  Concentration: Fair  Recall:  [employer] of Knowledge:Good  Language: Good  Akathisia:  No  Handed:    AIMS (if indicated):  not done  Assets:  Communication Skills Desire for Improvement Housing  ADL's:  Intact  Cognition: WNL  Sleep:  variable   Screenings: GAD-7    Flowsheet Row Office Visit from 10/23/2019 in [place] at [facility]  Total GAD-7 Score 11      PHQ2-9    [place] Office Visit from 09/28/2021 in [place] at [place] [facility] Visit from 10/23/2019 in [place] at [facility]  PHQ-2 Total Score 1 4  PHQ-9 Total Score -- 20      Flowsheet Row Video Visit from 04/05/2022 in [place] at [facility] Video Visit from 01/08/2022 in [place] at [facility] Video Visit from 11/02/2021 in [place] at [place] No Risk No Risk No Risk       Prior documentation reviewed   Assessment and Plan: as follows  Prior documentation reviewed  ADHD;fair on adderall continue  Generalized anxiety disorder:still stress but some better while increase dose of zolof tcontinue 125mg    Has refills for now  Insomnia; irregular at times, increase trazadone to 75mg   PTSD; baseline continue zolot  Divorce stress is high, provided supportive therapy and continue counselling and medications Balance life and work hours Fu 84m.  Collaboration of Care: Medication Management AEB med review and referral papers reviewed,   Patient/Guardian was advised Release of Information must be obtained prior to any record release in order to collaborate their care with an outside provider. Patient/Guardian was advised  if they have not already done so to contact the registration department  to sign all necessary forms in order for [place] to release information regarding their care.   Consent: Patient/Guardian gives verbal consent for treatment and assignment of benefits for services provided during this visit. Patient/Guardian expressed understanding and agreed to proceed.   Merian Capron, MD 3/25/20243:50 PM

## 2022-10-05 ENCOUNTER — Ambulatory Visit (INDEPENDENT_AMBULATORY_CARE_PROVIDER_SITE_OTHER): Payer: Managed Care, Other (non HMO)

## 2022-10-05 DIAGNOSIS — J309 Allergic rhinitis, unspecified: Secondary | ICD-10-CM

## 2022-10-09 ENCOUNTER — Other Ambulatory Visit: Payer: Self-pay | Admitting: Family Medicine

## 2022-10-12 ENCOUNTER — Ambulatory Visit (INDEPENDENT_AMBULATORY_CARE_PROVIDER_SITE_OTHER): Payer: Managed Care, Other (non HMO)

## 2022-10-12 DIAGNOSIS — R110 Nausea: Secondary | ICD-10-CM

## 2022-10-14 ENCOUNTER — Telehealth: Payer: Self-pay | Admitting: Family Medicine

## 2022-10-14 DIAGNOSIS — K8020 Calculus of gallbladder without cholecystitis without obstruction: Secondary | ICD-10-CM

## 2022-10-14 NOTE — Telephone Encounter (Signed)
General surgery referral entered.   CM

## 2022-10-14 NOTE — Telephone Encounter (Signed)
Pt called and wants to proceed with the referral to the surgeon for her gall bladder issue. Symptoms are getting worse.

## 2022-10-20 ENCOUNTER — Telehealth (HOSPITAL_COMMUNITY): Payer: Self-pay | Admitting: Psychiatry

## 2022-10-20 DIAGNOSIS — F900 Attention-deficit hyperactivity disorder, predominantly inattentive type: Secondary | ICD-10-CM

## 2022-10-20 MED ORDER — AMPHETAMINE-DEXTROAMPHETAMINE 10 MG PO TABS: 10.0000 mg | ORAL_TABLET | Freq: Every day | ORAL | 0 refills | Status: DC

## 2022-10-20 NOTE — Telephone Encounter (Signed)
Patient called requesting refill of:  amphetamine-dextroamphetamine (ADDERALL) 10 MG tablet   [id number] [place], [place] - [address] (Ph: [phone])   Last ordered: 08/13/2022 - 30 tablets  Last visit: 09/20/2022  Next visit:  12/15/2022

## 2022-10-22 NOTE — Telephone Encounter (Signed)
Medication management - Telephone call message left for patient to inform Dr. Gilmore Laroche had sent in her requested new [facility] [place] and to call back if any issues filling.

## 2022-10-26 ENCOUNTER — Ambulatory Visit (INDEPENDENT_AMBULATORY_CARE_PROVIDER_SITE_OTHER): Payer: Managed Care, Other (non HMO)

## 2022-10-26 DIAGNOSIS — J309 Allergic rhinitis, unspecified: Secondary | ICD-10-CM | POA: Diagnosis not present

## 2022-10-27 HISTORY — PX: CHOLECYSTECTOMY: SHX55

## 2022-11-06 ENCOUNTER — Other Ambulatory Visit (HOSPITAL_COMMUNITY): Payer: Self-pay | Admitting: Psychiatry

## 2022-11-10 DIAGNOSIS — J301 Allergic rhinitis due to pollen: Secondary | ICD-10-CM | POA: Diagnosis not present

## 2022-11-10 NOTE — Progress Notes (Signed)
EXP 11/10/23 

## 2022-11-18 ENCOUNTER — Ambulatory Visit: Payer: Managed Care, Other (non HMO)

## 2022-11-18 DIAGNOSIS — J309 Allergic rhinitis, unspecified: Secondary | ICD-10-CM

## 2022-11-19 ENCOUNTER — Other Ambulatory Visit: Payer: Self-pay | Admitting: General Surgery

## 2022-11-25 ENCOUNTER — Telehealth: Payer: Self-pay | Admitting: Family Medicine

## 2022-11-25 NOTE — Telephone Encounter (Signed)
There is no message.

## 2022-11-25 NOTE — Telephone Encounter (Signed)
I;m sorry she changed her mind she is calling her surgeon

## 2022-12-06 ENCOUNTER — Telehealth (HOSPITAL_COMMUNITY): Payer: Self-pay | Admitting: Psychiatry

## 2022-12-06 DIAGNOSIS — F900 Attention-deficit hyperactivity disorder, predominantly inattentive type: Secondary | ICD-10-CM

## 2022-12-06 MED ORDER — AMPHETAMINE-DEXTROAMPHETAMINE 10 MG PO TABS
10.0000 mg | ORAL_TABLET | Freq: Every day | ORAL | 0 refills | Status: DC
Start: 2022-12-06 — End: 2023-02-18

## 2022-12-06 NOTE — Telephone Encounter (Signed)
Medication management- Message left for patient that Dr.Akhtar had sent in her [facility] [address] and to call back if any questions.

## 2022-12-06 NOTE — Telephone Encounter (Signed)
Patient left voicemail 12/03/2022@1418  requsting refill of: amphetamine-dextroamphetamine (ADDERALL) 10 MG tablet.  [phone] [place], [place] - [address] Phone: [phone]  Fax: [phone]   Last ordered:  10/20/2022 - 30 tablets  Last visit: 09/20/2022  Next visit: 12/15/2022  Patient stated she had gall bladder surgery recently but has been cleared by that provider to resume this medication.

## 2022-12-07 ENCOUNTER — Ambulatory Visit (INDEPENDENT_AMBULATORY_CARE_PROVIDER_SITE_OTHER): Payer: Managed Care, Other (non HMO)

## 2022-12-07 DIAGNOSIS — J309 Allergic rhinitis, unspecified: Secondary | ICD-10-CM | POA: Diagnosis not present

## 2022-12-14 ENCOUNTER — Other Ambulatory Visit (HOSPITAL_COMMUNITY): Payer: Self-pay | Admitting: Psychiatry

## 2022-12-15 ENCOUNTER — Encounter (HOSPITAL_COMMUNITY): Payer: Self-pay | Admitting: Psychiatry

## 2022-12-15 ENCOUNTER — Telehealth (INDEPENDENT_AMBULATORY_CARE_PROVIDER_SITE_OTHER): Payer: 59 | Admitting: Psychiatry

## 2022-12-15 DIAGNOSIS — F411 Generalized anxiety disorder: Secondary | ICD-10-CM | POA: Diagnosis not present

## 2022-12-15 DIAGNOSIS — F5102 Adjustment insomnia: Secondary | ICD-10-CM

## 2022-12-15 DIAGNOSIS — F4310 Post-traumatic stress disorder, unspecified: Secondary | ICD-10-CM

## 2022-12-15 DIAGNOSIS — F900 Attention-deficit hyperactivity disorder, predominantly inattentive type: Secondary | ICD-10-CM | POA: Diagnosis not present

## 2022-12-15 NOTE — Progress Notes (Signed)
[facility] Follow up visit  Patient Identification: Barbara Sellers MRN:  [id number] Date of Evaluation:  12/15/2022 Referral Source: primary care Chief Complaint:   Follow up adhd, anxiety   Visit Diagnosis:    ICD-10-CM   1. PTSD (post-traumatic stress disorder)  F43.10     2. GAD (generalized anxiety disorder)  F41.1     3. Attention deficit hyperactivity disorder (ADHD), predominantly inattentive type  F90.0     4. Adjustment insomnia  F51.02     Virtual Visit via Video Note  I connected with Lakitha Stromme on 12/15/22 at  2:00 PM EDT by a video enabled telemedicine application and verified that I am speaking with the correct person using two identifiers.  Location: Patient: work Provider: home office   I discussed the limitations of evaluation and management by telemedicine and the availability of in person appointments. The patient expressed understanding and agreed to proceed.     I discussed the assessment and treatment plan with the patient. The patient was provided an opportunity to ask questions and all were answered. The patient agreed with the plan and demonstrated an understanding of the instructions.   The patient was advised to call back or seek an in-person evaluation if the symptoms worsen or if the condition fails to improve as anticipated.  I provided 15 minutes of non-face-to-face time during this encounter.         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  She is planning to go to college to learn cyber security.   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  Divorce, ffinances Modifying factors;dogs  Duration since age 38  Severity some better  Past Psychiatric History: PTSD, Adhd  Previous Psychotropic Medications: Yes   Substance Abuse History in the last 12 months:  No.  Consequences of Substance Abuse: Drinks one a week, denies concerns  Past Medical History:  Past Medical History:  Diagnosis Date   ADHD (attention deficit hyperactivity disorder)    Asthma    Depression    Eczema    Family history of breast cancer    Family history of melanoma    Family history of pancreatic cancer    Family history of prostate cancer    Migraines    Ocular migraine 10/23/2019   PTSD (post-traumatic stress disorder)    Recurrent upper respiratory infection (URI)    Rheumatoid arthritis (HCC)    Seasonal allergies    Swine flu 2009   Urticaria    Vertigo     Past Surgical History:  Procedure Laterality Date   APPENDECTOMY  1996   lasik Bilateral    ORIF DISTAL RADIUS FRACTURE     WISDOM TOOTH EXTRACTION        Family History:  Family History  Problem Relation Age of Onset   Macular degeneration Mother    Urticaria Mother    Colon polyps Father    Diabetes Father    Diverticulitis Father    Cancer Father    Cancer - Other Father        Adenocarcinoma   Allergic rhinitis  Father    Asthma Father    Eczema Father    Allergic rhinitis Brother    Arrhythmia Brother        half-brother   Allergic rhinitis Brother    Diabetes Maternal Aunt    Allergic rhinitis Maternal Aunt    Cancer Maternal Grandmother        SKIN   Cancer Maternal Grandfather        SKIN   Hypertension Paternal Grandmother    Skin cancer Paternal Grandmother    Allergic rhinitis Paternal Grandmother    Asthma Paternal Grandmother    Hypertension Paternal Grandfather    Cancer Paternal Grandfather        melanoma, kidney, bladder, prostate, lung, liver cancer   Skin cancer Paternal Grandfather    Melanoma Paternal Aunt     Allergic rhinitis Paternal Aunt    Asthma Paternal Aunt    Pancreatic cancer Other    Breast cancer Maternal Aunt 59   Allergic rhinitis Maternal Aunt    Pancreatic cancer Maternal Aunt    Allergic rhinitis Maternal Aunt    Allergic rhinitis Maternal Aunt    Asthma Maternal Aunt    Melanoma Paternal Aunt    Allergic rhinitis Paternal Aunt    Asthma Paternal Aunt    Melanoma Other     Social History:   Social History   Socioeconomic History   Marital status: Legally Separated    Spouse name: Not on file   Number of children: Not on file   Years of education: Not on file   Highest education level: Not on file  Occupational History   Occupation: Consulting civil engineer   Occupation: POLICE OFFICER    Employer: [employer]  Tobacco Use   Smoking status: Former    Types: Cigarettes   Smokeless tobacco: Never  Vaping Use   Vaping Use: Never used  Substance and Sexual Activity   Alcohol use: Yes    Alcohol/week: 2.0 standard drinks of alcohol    Types: 2 Standard drinks or equivalent per week    Comment: rare   Drug use: Never   Sexual activity: Yes    Partners: Female    Comment: Same sex marriage  Other Topics Concern   Not on file  Social History Narrative   She exercises at the gym and does kickboxing and soccer.   Social Determinants of Health   Financial Resource Strain: Not on file  Food Insecurity: Not on file  Transportation Needs: Not on file  Physical Activity: Not on file  Stress: Not on file  Social Connections: Not on file     Allergies:   Allergies  Allergen Reactions   Cayenne Anaphylaxis   Azithromycin Itching and Rash    Also burning.   Also burning. Also burning.   Doxycycline Other (See Comments) and Swelling    Esophagus issues. "felt like razor blades" Throat swelling  Other reaction(s): Other (See Comments) Throat swelling Esophagus issues. "felt like razor blades" Esophagus issues. "felt like razor blades" Throat swelling   Doxycycline  Calcium Other (See Comments)   Buspar [Buspirone] Anxiety    Rx caused muscle spasms    Metabolic Disorder Labs: No results found for: "HGBA1C", "MPG" No results found for: "PROLACTIN" No results found for: "CHOL", "TRIG", "HDL", "CHOLHDL", "VLDL", "LDLCALC" Lab Results  Component Value Date   TSH 1.33 10/07/2021    Therapeutic Level Labs: No results found for: "LITHIUM" No results found for: "CBMZ" No results found for: "VALPROATE"  Current  Medications: Current Outpatient Medications  Medication Sig Dispense Refill   albuterol (VENTOLIN HFA) 108 (90 Base) MCG/ACT inhaler Inhale 1-2 puffs into the lungs every 4 (four) hours as needed for wheezing or shortness of breath. 8 g 1   amphetamine-dextroamphetamine (ADDERALL) 10 MG tablet Take 1 tablet (10 mg total) by mouth daily. 30 tablet 0   pantoprazole (PROTONIX) 40 MG tablet TAKE 1 TABLET BY MOUTH EVERY DAY 90 tablet 1   sertraline (ZOLOFT) 100 MG tablet TAKE 1 TABLET BY MOUTH EVERY DAY 90 tablet 0   sertraline (ZOLOFT) 25 MG tablet TAKE 1 TABLET (25 MG TOTAL) BY MOUTH DAILY. 90 tablet 0   traZODone (DESYREL) 50 MG tablet TAKE 1.5 TABLETS BY MOUTH AT BEDTIME AS NEEDED. FOR SLEEP *INS REQUIRES A 90 DAY SUPPLY* 135 tablet 1   No current facility-administered medications for this visit.     Psychiatric Specialty Exam: Review of Systems  Cardiovascular:  Negative for chest pain.  Neurological:  Negative for tremors.  Psychiatric/Behavioral:  Negative for agitation, hallucinations and self-injury. The patient is nervous/anxious.     There were no vitals taken for this visit.There is no height or weight on file to calculate BMI.  General Appearance: Neat  Eye Contact:  Good  Speech:  Clear and Coherent  Volume:  Normal  Mood:  better  Affect:  Congruent  Thought Process:  Goal Directed  Orientation:  Full (Time, Place, and Person)  Thought Content:  Rumination  Suicidal Thoughts:  No  Homicidal Thoughts:  No  Memory:   Immediate;   Fair  Judgement:  Fair  Insight:  Fair  Psychomotor Activity:  Normal  Concentration:  Concentration: Fair  Recall:  Fair  Fund of Knowledge:Good  Language: Good  Akathisia:  No  Handed:    AIMS (if indicated):  not done  Assets:  Communication Skills Desire for Improvement Housing  ADL's:  Intact  Cognition: WNL  Sleep:  variable   Screenings: GAD-7    Flowsheet Row Office Visit from 10/23/2019 in [facility]  Total GAD-7 Score 11      PHQ2-9    Flowsheet Row Office Visit from 09/28/2021 in [facility] at [facility] Office Visit from 10/23/2019 in [facility] at [facility]  PHQ-2 Total Score 1 4  PHQ-9 Total Score -- 20      Flowsheet Row Video Visit from 04/05/2022 in [facility] at [facility] Video Visit from 01/08/2022 in [facility] at [facility] Video Visit from 11/02/2021 in [facility] at [facility]  C-SSRS RISK CATEGORY No Risk No Risk No Risk        Assessment and Plan: as follows  Prior documentation reviewed  ADHD; fair but at times feel dose is low, discussed to balance work hours, sleep and not to exhaust herself Generalized anxiety disorder:improved conitnue zoloft 125mg     Insomnia; can be irregular at times, continue trazadone it helps  PTSD; baseline continue zolot  Fu  87m. Renewed meds which were due   Collaboration of Care: Medication Management AEB med review and referral papers reviewed,   Patient/Guardian was advised Release of Information must be obtained prior to any record release in order to collaborate their care with an outside provider. Patient/Guardian was advised if they have not already done so to contact the registration department to sign all necessary forms  in  order for [place] to release information regarding their care.   Consent: Patient/Guardian gives verbal consent for treatment and assignment of benefits for services provided during this visit. Patient/Guardian expressed understanding and agreed to proceed.   Thresa Ross, MD 6/19/20242:10 PM

## 2022-12-28 ENCOUNTER — Ambulatory Visit: Payer: Managed Care, Other (non HMO)

## 2022-12-28 DIAGNOSIS — J309 Allergic rhinitis, unspecified: Secondary | ICD-10-CM | POA: Diagnosis not present

## 2023-01-03 NOTE — Progress Notes (Unsigned)
Follow Up Note  RE: Barbara Sellers MRN: [id number] DOB: [birth year] Date of Office Visit: 01/04/2023  Referring provider: Everrett Coombe, DO Primary care provider: Everrett Coombe, DO  Chief Complaint: No chief complaint on file.  History of Present Illness: I had the pleasure of seeing Barbara Sellers for a follow up visit at the [facility] on 01/03/2023. She is a 32 y.o. female, who is being followed for asthma, allergic rhinoconjunctivitis on AIT, adverse food reaction and drug reaction. Her previous allergy office visit was on 10/01/2021 with Dr. Selena Batten. Today is a regular follow up visit.  Mild intermittent asthma without complication Past history - Diagnosed with asthma as a child and was doing well up until recently. She had COVID-19 in January 2021. Recently started on Incruse and using albuterol less but was using it multiple times per day. Prednisone did not help. CXR on 07/11/2019 - Small focus of airspace disease in the right lung base. 2021 spirometry was normal with no improvement in FEV1 post bronchodilator treatment. Clinically feeling the same.  Interim history - stopped maintenance inhaler 1 year ago due to insurance issues. No flare in symptoms. Today's spirometry was normal.  May use albuterol rescue inhaler 2 puffs every 4 to 6 hours as needed for shortness of breath, chest tightness, coughing, and wheezing. May use albuterol rescue inhaler 2 puffs 5 to 15 minutes prior to strenuous physical activities. Monitor frequency of use.    Seasonal and perennial allergic rhinoconjunctivitis Past history - Perennial rhino conjunctivitis symptoms for 20+ years with worsening in the spring and summer. Other triggers include cats. 2015 skin testing in the past was positive to multiple pollens, cat, mold per patient report. On allergy injections in the 1990s with good benefit. 2021 skin testing showed: Positive to grass, ragweed. Borderline to mold and cat. Started AIT on 12/20/2019  (G-RW and M-C).  Interim history - doing well with below regimen with no issues. Continue environmental control measures. Continue allergy injections - given today. Okay to build up new vial with 0.41mL, 0.36mL, 0.15mL schedule.  Only take the allergy medication as needed and not daily. Definitely take on the days of injections and the days when doing yardwork.   Adverse food reaction Past history - Anaphylactic reaction to cayenne pepper in the past. Tolerates other peppers including jalapenos with no issues. Last testing done in 2015. Interim history - accidentally had a food with small amount of cayenne pepper and only had throat itching for 30 seconds.  Continue strict avoidance of cayenne pepper. For mild symptoms you can take over the counter antihistamines such as Benadryl and monitor symptoms closely. If symptoms worsen or if you have severe symptoms including breathing issues, throat closure, significant swelling, whole body hives, severe diarrhea and vomiting, lightheadedness then inject epinephrine and seek immediate medical care afterwards. Emergency action plan in place.    Drug reaction Past history - Reactions to azithromycin and doxycycline in the past. Continue to avoid.  Consider drug challenges in the future.    Return in about 1 year (around 10/02/2022).  Assessment and Plan: Barbara Sellers is a 31 y.o. female with: No problem-specific Assessment & Plan notes found for this encounter.  No follow-ups on file.  No orders of the defined types were placed in this encounter.  Lab Orders  No laboratory test(s) ordered today    Diagnostics: Spirometry:  Tracings reviewed. Her effort: {Blank single:19197::"Good reproducible efforts.","It was hard to get consistent efforts and there is a  question as to whether this reflects a maximal maneuver.","Poor effort, data can not be interpreted."} FVC: ***L FEV1: ***L, ***% predicted FEV1/FVC ratio: ***% Interpretation: {Blank  single:19197::"Spirometry consistent with mild obstructive disease","Spirometry consistent with moderate obstructive disease","Spirometry consistent with severe obstructive disease","Spirometry consistent with possible restrictive disease","Spirometry consistent with mixed obstructive and restrictive disease","Spirometry uninterpretable due to technique","Spirometry consistent with normal pattern","No overt abnormalities noted given today's efforts"}.  Please see scanned spirometry results for details.  Skin Testing: {Blank single:19197::"Select foods","Environmental allergy panel","Environmental allergy panel and select foods","Food allergy panel","None","Deferred due to recent antihistamines use"}. *** Results discussed with patient/family.   Medication List:  Current Outpatient Medications  Medication Sig Dispense Refill  . albuterol (VENTOLIN HFA) 108 (90 Base) MCG/ACT inhaler Inhale 1-2 puffs into the lungs every 4 (four) hours as needed for wheezing or shortness of breath. 8 g 1  . amphetamine-dextroamphetamine (ADDERALL) 10 MG tablet Take 1 tablet (10 mg total) by mouth daily. 30 tablet 0  . pantoprazole (PROTONIX) 40 MG tablet TAKE 1 TABLET BY MOUTH EVERY DAY 90 tablet 1  . sertraline (ZOLOFT) 100 MG tablet TAKE 1 TABLET BY MOUTH EVERY DAY 90 tablet 0  . sertraline (ZOLOFT) 25 MG tablet TAKE 1 TABLET (25 MG TOTAL) BY MOUTH DAILY. 90 tablet 0  . traZODone (DESYREL) 50 MG tablet TAKE 1.5 TABLETS BY MOUTH AT BEDTIME AS NEEDED. FOR SLEEP *INS REQUIRES A 90 DAY SUPPLY* 135 tablet 1   No current facility-administered medications for this visit.   Allergies: Allergies  Allergen Reactions  . Cayenne Anaphylaxis  . Azithromycin Itching and Rash    Also burning.   Also burning. Also burning.  . Doxycycline Other (See Comments) and Swelling    Esophagus issues. "felt like razor blades" Throat swelling  Other reaction(s): Other (See Comments) Throat swelling Esophagus issues. "felt  like razor blades" Esophagus issues. "felt like razor blades" Throat swelling  . Doxycycline Calcium Other (See Comments)  . Buspar [Buspirone] Anxiety    Rx caused muscle spasms   I reviewed her past medical history, social history, family history, and environmental history and no significant changes have been reported from her previous visit.  Review of Systems  Constitutional:  Negative for appetite change, chills, fever and unexpected weight change.  HENT:  Negative for congestion, rhinorrhea and sneezing.   Eyes:  Negative for itching.  Respiratory:  Negative for cough, chest tightness, shortness of breath and wheezing.   Cardiovascular:  Negative for chest pain.  Gastrointestinal:  Negative for abdominal pain.  Genitourinary:  Negative for difficulty urinating.  Skin:  Negative for rash.  Allergic/Immunologic: Positive for environmental allergies and food allergies.   Objective: There were no vitals taken for this visit. There is no height or weight on file to calculate BMI. Physical Exam Vitals and nursing note reviewed.  Constitutional:      Appearance: She is well-developed.  HENT:     Head: Normocephalic and atraumatic.     Right Ear: External ear normal.     Left Ear: External ear normal.     Nose: Nose normal.     Mouth/Throat:     Mouth: Mucous membranes are moist.     Pharynx: Oropharynx is clear.  Eyes:     Conjunctiva/sclera: Conjunctivae normal.  Cardiovascular:     Rate and Rhythm: Normal rate and regular rhythm.     Heart sounds: Normal heart sounds. No murmur heard.    No friction rub. No gallop.  Pulmonary:     Effort: Pulmonary  effort is normal.     Breath sounds: Normal breath sounds. No wheezing or rales.  Musculoskeletal:     Cervical back: Neck supple.  Skin:    General: Skin is warm.     Findings: No rash.  Neurological:     Mental Status: She is alert and oriented to person, place, and time.  Psychiatric:        Behavior: Behavior  normal.  Previous notes and tests were reviewed. The plan was reviewed with the patient/family, and all questions/concerned were addressed.  It was my pleasure to see Barbara Sellers today and participate in her care. Please feel free to contact me with any questions or concerns.  Sincerely,  Wyline Mood, DO Allergy & Immunology  [facility] office: [phone] [facility] office: [phone]

## 2023-01-04 ENCOUNTER — Ambulatory Visit: Payer: Managed Care, Other (non HMO) | Admitting: Allergy

## 2023-01-04 ENCOUNTER — Encounter: Payer: Self-pay | Admitting: Allergy

## 2023-01-04 VITALS — BP 114/76 | HR 88 | Temp 98.1°F | Resp 18 | Ht 61.0 in | Wt 134.0 lb

## 2023-01-04 DIAGNOSIS — J4520 Mild intermittent asthma, uncomplicated: Secondary | ICD-10-CM

## 2023-01-04 DIAGNOSIS — T781XXD Other adverse food reactions, not elsewhere classified, subsequent encounter: Secondary | ICD-10-CM

## 2023-01-04 DIAGNOSIS — J302 Other seasonal allergic rhinitis: Secondary | ICD-10-CM

## 2023-01-04 DIAGNOSIS — H1013 Acute atopic conjunctivitis, bilateral: Secondary | ICD-10-CM

## 2023-01-04 DIAGNOSIS — Z888 Allergy status to other drugs, medicaments and biological substances status: Secondary | ICD-10-CM | POA: Diagnosis not present

## 2023-01-04 DIAGNOSIS — H1010 Acute atopic conjunctivitis, unspecified eye: Secondary | ICD-10-CM

## 2023-01-04 DIAGNOSIS — T50905D Adverse effect of unspecified drugs, medicaments and biological substances, subsequent encounter: Secondary | ICD-10-CM

## 2023-01-04 NOTE — Patient Instructions (Addendum)
Environmental allergies Past skin testing showed: Positive to grass, ragweed. Borderline to mold and cat. Continue environmental control measures. Continue allergy injections.  May use over the counter antihistamines such as Zyrtec (cetirizine), Claritin (loratadine), Allegra (fexofenadine), or Xyzal (levocetirizine) daily as needed. May take twice a day during allergy flares. May switch antihistamines every few months.  Asthma: May use albuterol rescue inhaler 2 puffs every 4 to 6 hours as needed for shortness of breath, chest tightness, coughing, and wheezing. May use albuterol rescue inhaler 2 puffs 5 to 15 minutes prior to strenuous physical activities. Monitor frequency of use.  Asthma control goals:  Full participation in all desired activities (may need albuterol before activity) Albuterol use two times or less a week on average (not counting use with activity) Cough interfering with sleep two times or less a month Oral steroids no more than once a year No hospitalizations  Food allergy: Continue strict avoidance of cayenne pepper. For mild symptoms you can take over the counter antihistamines such as Benadryl and monitor symptoms closely. If symptoms worsen or if you have severe symptoms including breathing issues, throat closure, significant swelling, whole body hives, severe diarrhea and vomiting, lightheadedness then inject epinephrine and seek immediate medical care afterwards. Emergency action plan in place.   Follow up in 12 months or sooner if needed.

## 2023-01-04 NOTE — Assessment & Plan Note (Signed)
Past history - Perennial rhino conjunctivitis symptoms for 20+ years with worsening in the spring and summer. Other triggers include cats. 2015 skin testing in the past was positive to multiple pollens, cat, mold per patient report. On allergy injections in the 1990s with good benefit. 2021 skin testing showed: Positive to grass, ragweed. Borderline to mold and cat. Started AIT on 12/20/2019 (G-RW and M-C).  Interim history - doing well and AIT is helping.  Continue environmental control measures. Continue allergy injections.  May use over the counter antihistamines such as Zyrtec (cetirizine), Claritin (loratadine), Allegra (fexofenadine), or Xyzal (levocetirizine) daily as needed. May take twice a day during allergy flares. May switch antihistamines every few months.

## 2023-01-04 NOTE — Assessment & Plan Note (Signed)
Past history - Reactions to azithromycin and doxycycline in the past. Continue to avoid.  Not interested in drug challenges.

## 2023-01-04 NOTE — Assessment & Plan Note (Signed)
Past history - Diagnosed with asthma as a child and was doing well up until recently. She had COVID-19 in January 2021. Recently started on Incruse and using albuterol less but was using it multiple times per day. Prednisone did not help. CXR on 07/11/2019 - Small focus of airspace disease in the right lung base. 2021 spirometry was normal with no improvement in FEV1 post bronchodilator treatment. Clinically feeling the same.  Interim history - asymptomatic. Only used albuterol prior surgery. May use albuterol rescue inhaler 2 puffs every 4 to 6 hours as needed for shortness of breath, chest tightness, coughing, and wheezing. May use albuterol rescue inhaler 2 puffs 5 to 15 minutes prior to strenuous physical activities. Monitor frequency of use.

## 2023-01-04 NOTE — Assessment & Plan Note (Signed)
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 Tolerates other peppers including jalapenos with no issues. Last testing done in 2015. Interim history - no reactions.   Continue strict avoidance of cayenne pepper.  For mild symptoms you can take over the counter antihistamines such as Benadryl and monitor symptoms closely. If symptoms worsen or if you have severe symptoms including breathing issues, throat closure, significant swelling, whole body hives, severe diarrhea and vomiting, lightheadedness then inject epinephrine and seek immediate medical care afterwards.  Emergency action plan in place.  

## 2023-01-18 ENCOUNTER — Ambulatory Visit: Payer: Managed Care, Other (non HMO)

## 2023-01-18 DIAGNOSIS — J309 Allergic rhinitis, unspecified: Secondary | ICD-10-CM

## 2023-01-27 ENCOUNTER — Ambulatory Visit (INDEPENDENT_AMBULATORY_CARE_PROVIDER_SITE_OTHER): Payer: Managed Care, Other (non HMO)

## 2023-01-27 DIAGNOSIS — J309 Allergic rhinitis, unspecified: Secondary | ICD-10-CM | POA: Diagnosis not present

## 2023-02-03 ENCOUNTER — Ambulatory Visit: Payer: Managed Care, Other (non HMO)

## 2023-02-03 DIAGNOSIS — J309 Allergic rhinitis, unspecified: Secondary | ICD-10-CM | POA: Diagnosis not present

## 2023-02-08 ENCOUNTER — Other Ambulatory Visit (HOSPITAL_COMMUNITY): Payer: Self-pay | Admitting: Psychiatry

## 2023-02-08 DIAGNOSIS — F4310 Post-traumatic stress disorder, unspecified: Secondary | ICD-10-CM

## 2023-02-14 ENCOUNTER — Ambulatory Visit
Admission: EM | Admit: 2023-02-14 | Discharge: 2023-02-14 | Disposition: A | Discharge status: Home / Self Care | Payer: Managed Care, Other (non HMO) | Attending: Family Medicine | Admitting: Family Medicine

## 2023-02-14 ENCOUNTER — Encounter: Payer: Self-pay | Admitting: Emergency Medicine

## 2023-02-14 DIAGNOSIS — J3489 Other specified disorders of nose and nasal sinuses: Secondary | ICD-10-CM | POA: Diagnosis not present

## 2023-02-14 DIAGNOSIS — R059 Cough, unspecified: Secondary | ICD-10-CM | POA: Diagnosis not present

## 2023-02-14 DIAGNOSIS — J0100 Acute maxillary sinusitis, unspecified: Secondary | ICD-10-CM

## 2023-02-14 MED ORDER — BENZONATATE 200 MG PO CAPS: 200.0000 mg | ORAL_CAPSULE | Freq: Three times a day (TID) | ORAL | 0 refills | Status: AC | PRN

## 2023-02-14 MED ORDER — AMOXICILLIN-POT CLAVULANATE 875-125 MG PO TABS: 1.0000 | ORAL_TABLET | Freq: Two times a day (BID) | ORAL | 0 refills | Status: AC

## 2023-02-14 MED ORDER — PREDNISONE 10 MG (21) PO TBPK: ORAL_TABLET | Freq: Every day | ORAL | 0 refills | Status: DC

## 2023-02-14 NOTE — ED Triage Notes (Signed)
Patient c/o cough, sore throat, sinus congestion, nasal drainage x 1 week.  Low grade fever.  Patient has taken Dayquil and Sudafed.

## 2023-02-14 NOTE — Discharge Instructions (Addendum)
Advised patient to take medications as directed with food to completion.  Advised patient to take prednisone with first dose of Augmentin for the next 10 days.  Advised may take Tessalon capsules daily or as needed for cough.  Encouraged increase daily water intake to 64 ounces per day while taking these medications.  Advised if symptoms worsen and/or unresolved please follow-up PCP or here for further evaluation.

## 2023-02-14 NOTE — ED Provider Notes (Signed)
[facility]    CSN: [id number] Arrival date & time: 02/14/23  1134      History   Chief Complaint Chief Complaint  Patient presents with   Sinus Infection    HPI Barbara Sellers is a 32 y.o. female.   HPI 32 year old female presents with cough, sore throat, sinus congestion, and drainage for 1 week.  Additionally, patient reports loss of voice.  Patient reports taking OTC DayQuil and Sudafed with little to no relief.  PMH significant for migraine, PTSD, and ADHD.  Past Medical History:  Diagnosis Date   ADHD (attention deficit hyperactivity disorder)    Asthma    Depression    Eczema    Family history of breast cancer    Family history of melanoma    Family history of pancreatic cancer    Family history of prostate cancer    Migraines    Ocular migraine 10/23/2019   PTSD (post-traumatic stress disorder)    Recurrent upper respiratory infection (URI)    Rheumatoid arthritis (HCC)    Seasonal allergies    Swine flu 2009   Urticaria    Vertigo     Patient Active Problem List   Diagnosis Date Noted   Blood in stool 06/06/2022   Postprandial nausea 06/06/2022   Right lower quadrant abdominal pain 12/02/2021   Diverticulosis 12/02/2021   History of diverticulitis 12/02/2021   Abdominal guarding 12/02/2021   ADHD 10/07/2021   Abnormal weight gain 10/07/2021   Mild intermittent asthma without complication 10/01/2021   Anxiety 09/28/2021   Migraine 09/28/2021   Hemifacial spasm of left side of face 01/15/2021   Acute abdominal pain in left lower quadrant 11/07/2020   Concussion 10/24/2020   Acute pain of right shoulder 09/26/2020   Seasonal and perennial allergic rhinoconjunctivitis 03/04/2020   Genetic testing 12/10/2019   Asthma 11/29/2019   Adverse food reaction 11/29/2019   Drug reaction 11/29/2019   Family history of melanoma    Family history of breast cancer    Family history of pancreatic cancer    Family history of prostate cancer    Ocular  migraine 10/23/2019   Cubital tunnel syndrome, left 07/19/2018   S/P hardware removal 10/09/2015   Fracture of metacarpal base of left hand, closed 11/15/2014   Closed fracture of distal end of left radius 11/11/2014    Past Surgical History:  Procedure Laterality Date   APPENDECTOMY  1996   lasik Bilateral    ORIF DISTAL RADIUS FRACTURE     WISDOM TOOTH EXTRACTION      OB History     Gravida  0   Para      Term      Preterm      AB      Living         SAB      IAB      Ectopic      Multiple      Live Births               Home Medications    Prior to Admission medications   Medication Sig Start Date End Date Taking? Authorizing Provider  albuterol (VENTOLIN HFA) 108 (90 Base) MCG/ACT inhaler Inhale 1-2 puffs into the lungs every 4 (four) hours as needed for wheezing or shortness of breath. 01/13/21  Yes Carlis Stable, PA-C  amoxicillin-clavulanate (AUGMENTIN) 875-125 MG tablet Take 1 tablet by mouth 2 (two) times daily for 10 days. 02/14/23 02/24/23 Yes Trevor Iha,  FNP  benzonatate (TESSALON) 200 MG capsule Take 1 capsule (200 mg total) by mouth 3 (three) times daily as needed for up to 7 days. 02/14/23 02/21/23 Yes Trevor Iha, FNP  pantoprazole (PROTONIX) 40 MG tablet TAKE 1 TABLET BY MOUTH EVERY DAY 10/11/22  Yes Everrett Coombe, DO  predniSONE (STERAPRED UNI-PAK 21 TAB) 10 MG (21) TBPK tablet Take by mouth daily. Take 6 tabs by mouth daily  for 2 days, then 5 tabs for 2 days, then 4 tabs for 2 days, then 3 tabs for 2 days, 2 tabs for 2 days, then 1 tab by mouth daily for 2 days 02/14/23  Yes Trevor Iha, FNP  sertraline (ZOLOFT) 100 MG tablet TAKE 1 TABLET BY MOUTH EVERY DAY 12/14/22  Yes Thresa Ross, MD  sertraline (ZOLOFT) 25 MG tablet TAKE 1 TABLET (25 MG TOTAL) BY MOUTH DAILY. 02/08/23 02/08/24 Yes Thresa Ross, MD  traZODone (DESYREL) 50 MG tablet TAKE 1.5 TABLETS BY MOUTH AT BEDTIME AS NEEDED. FOR SLEEP *INS REQUIRES A 90 DAY  SUPPLY* 02/08/23  Yes Thresa Ross, MD  amphetamine-dextroamphetamine (ADDERALL) 10 MG tablet Take 1 tablet (10 mg total) by mouth daily. 12/06/22 01/05/23  Thresa Ross, MD    Family History Family History  Problem Relation Age of Onset   Macular degeneration Mother    Urticaria Mother    Colon polyps Father    Diabetes Father    Diverticulitis Father    Cancer Father    Cancer - Other Father        Adenocarcinoma   Allergic rhinitis Father    Asthma Father    Eczema Father    Allergic rhinitis Brother    Arrhythmia Brother        half-brother   Allergic rhinitis Brother    Diabetes Maternal Aunt    Allergic rhinitis Maternal Aunt    Cancer Maternal Grandmother        SKIN   Cancer Maternal Grandfather        SKIN   Hypertension Paternal Grandmother    Skin cancer Paternal Grandmother    Allergic rhinitis Paternal Grandmother    Asthma Paternal Grandmother    Hypertension Paternal Grandfather    Cancer Paternal Grandfather        melanoma, kidney, bladder, prostate, lung, liver cancer   Skin cancer Paternal Grandfather    Melanoma Paternal Aunt    Allergic rhinitis Paternal Aunt    Asthma Paternal Aunt    Pancreatic cancer Other    Breast cancer Maternal Aunt 59   Allergic rhinitis Maternal Aunt    Pancreatic cancer Maternal Aunt    Allergic rhinitis Maternal Aunt    Allergic rhinitis Maternal Aunt    Asthma Maternal Aunt    Melanoma Paternal Aunt    Allergic rhinitis Paternal Aunt    Asthma Paternal Aunt    Melanoma Other     Social History Social History   Tobacco Use   Smoking status: Former    Types: Cigarettes   Smokeless tobacco: Never  Vaping Use   Vaping status: Never Used  Substance Use Topics   Alcohol use: Yes    Alcohol/week: 2.0 standard drinks of alcohol    Types: 2 Standard drinks or equivalent per week    Comment: rare   Drug use: Never     Allergies   Cayenne, Azithromycin, Doxycycline, Doxycycline calcium, and Buspar  [buspirone]   Review of Systems Review of Systems  HENT:  Positive for congestion, ear pain, sinus pressure, sinus  pain and voice change.   All other systems reviewed and are negative.    Physical Exam Triage Vital Signs ED Triage Vitals  Encounter Vitals Group     BP 02/14/23 1143 110/75     Systolic BP Percentile --      Diastolic BP Percentile --      Pulse Rate 02/14/23 1143 84     Resp 02/14/23 1143 16     Temp 02/14/23 1143 98.1 F (36.7 C)     Temp Source 02/14/23 1143 Oral     SpO2 02/14/23 1143 95 %     Weight 02/14/23 1144 128 lb (58.1 kg)     Height 02/14/23 1144 5\' 1"  (1.549 m)     Head Circumference --      Peak Flow --      Pain Score 02/14/23 1144 5     Pain Loc --      Pain Education --      Exclude from Growth Chart --    No data found.  Updated Vital Signs BP 110/75 (BP Location: Right Arm)   Pulse 84   Temp 98.1 F (36.7 C) (Oral)   Resp 16   Ht 5\' 1"  (1.549 m)   Wt 128 lb (58.1 kg)   SpO2 95%   BMI 24.19 kg/m      Physical Exam Vitals and nursing note reviewed.  Constitutional:      General: She is not in acute distress.    Appearance: Normal appearance. She is normal weight. She is ill-appearing.  HENT:     Head: Normocephalic and atraumatic.     Right Ear: Tympanic membrane and external ear normal.     Left Ear: Tympanic membrane and external ear normal.     Ears:     Comments: Significant eustachian tube dysfunction noted bilaterally    Nose: Nose normal.     Mouth/Throat:     Mouth: Mucous membranes are moist.     Pharynx: Oropharynx is clear.  Eyes:     Extraocular Movements: Extraocular movements intact.     Conjunctiva/sclera: Conjunctivae normal.     Pupils: Pupils are equal, round, and reactive to light.  Cardiovascular:     Rate and Rhythm: Normal rate and regular rhythm.     Pulses: Normal pulses.     Heart sounds: Normal heart sounds.  Pulmonary:     Effort: Pulmonary effort is normal.     Breath sounds: Normal  breath sounds. No wheezing, rhonchi or rales.     Comments: Infrequent nonproductive cough noted on exam Musculoskeletal:        General: Normal range of motion.     Cervical back: Normal range of motion and neck supple.  Skin:    General: Skin is warm and dry.  Neurological:     General: No focal deficit present.     Mental Status: She is alert and oriented to person, place, and time. Mental status is at baseline.  Psychiatric:        Mood and Affect: Mood normal.        Behavior: Behavior normal.        Thought Content: Thought content normal.      UC Treatments / Results  Labs (all labs ordered are listed, but only abnormal results are displayed) Labs Reviewed - No data to display  EKG   Radiology No results found.  Procedures Procedures (including critical care time)  Medications Ordered in UC Medications - No data  to display  Initial Impression / Assessment and Plan / UC Course  I have reviewed the triage vital signs and the nursing notes.  Pertinent labs & imaging results that were available during my care of the patient were reviewed by me and considered in my medical decision making (see chart for details).     MDM: 1.  Acute maxillary sinusitis, recurrence not specified-Rx'd Augmentin 875/125 mg tablet twice daily x 10 days; 2.  Sinus pressure-Rx'd Sterapred Unipak (tapering from 60 mg to 10 mg over 10 days); 3.  Cough, unspecified type-Rx'd Tessalon capsules 200 mg 3 times daily, as needed for cough. Advised patient to take medications as directed with food to completion.  Advised patient to take prednisone with first dose of Augmentin for the next 10 days.  Advised may take Tessalon capsules daily or as needed for cough.  Encouraged increase daily water intake to 64 ounces per day while taking these medications.  Advised if symptoms worsen and/or unresolved please follow-up PCP or here for further evaluation.  Work note provided to patient prior to discharge  today.  Patient discharged home, hemodynamically stable. Final Clinical Impressions(s) / UC Diagnoses   Final diagnoses:  Acute maxillary sinusitis, recurrence not specified  Sinus pressure  Cough, unspecified type     Discharge Instructions      Advised patient to take medications as directed with food to completion.  Advised patient to take prednisone with first dose of Augmentin for the next 10 days.  Advised may take Tessalon capsules daily or as needed for cough.  Encouraged increase daily water intake to 64 ounces per day while taking these medications.  Advised if symptoms worsen and/or unresolved please follow-up PCP or here for further evaluation.     ED Prescriptions     Medication Sig Dispense Auth. Provider   amoxicillin-clavulanate (AUGMENTIN) 875-125 MG tablet Take 1 tablet by mouth 2 (two) times daily for 10 days. 20 tablet Trevor Iha, FNP   predniSONE (STERAPRED UNI-PAK 21 TAB) 10 MG (21) TBPK tablet Take by mouth daily. Take 6 tabs by mouth daily  for 2 days, then 5 tabs for 2 days, then 4 tabs for 2 days, then 3 tabs for 2 days, 2 tabs for 2 days, then 1 tab by mouth daily for 2 days 42 tablet Trevor Iha, FNP   benzonatate (TESSALON) 200 MG capsule Take 1 capsule (200 mg total) by mouth 3 (three) times daily as needed for up to 7 days. 40 capsule Trevor Iha, FNP      PDMP not reviewed this encounter.   Trevor Iha, FNP 02/14/23 1226

## 2023-02-15 ENCOUNTER — Encounter: Payer: Self-pay | Admitting: Family Medicine

## 2023-02-18 ENCOUNTER — Telehealth (HOSPITAL_COMMUNITY): Payer: Self-pay | Admitting: *Deleted

## 2023-02-18 DIAGNOSIS — F900 Attention-deficit hyperactivity disorder, predominantly inattentive type: Secondary | ICD-10-CM

## 2023-02-18 MED ORDER — AMPHETAMINE-DEXTROAMPHETAMINE 10 MG PO TABS
10.0000 mg | ORAL_TABLET | Freq: Every day | ORAL | 0 refills | Status: DC
Start: 2023-02-18 — End: 2023-04-13

## 2023-02-18 NOTE — Telephone Encounter (Signed)
PATIENT REFILL REQUEST [facility] [phone] - [place], [place] - [address]   amphetamine-dextroamphetamine (ADDERALL) 10 MG tablet   NEXT APPT  04/13/23 NEXT APPT  12/05/22

## 2023-02-18 NOTE — Addendum Note (Signed)
Addended by: Thresa Ross on: 02/18/2023 12:28 PM   Modules accepted: Orders

## 2023-03-10 ENCOUNTER — Ambulatory Visit: Payer: Managed Care, Other (non HMO)

## 2023-03-10 DIAGNOSIS — J309 Allergic rhinitis, unspecified: Secondary | ICD-10-CM | POA: Diagnosis not present

## 2023-03-11 ENCOUNTER — Other Ambulatory Visit (HOSPITAL_COMMUNITY): Payer: Self-pay | Admitting: Psychiatry

## 2023-03-25 ENCOUNTER — Other Ambulatory Visit: Payer: Self-pay | Admitting: General Surgery

## 2023-03-25 DIAGNOSIS — K429 Umbilical hernia without obstruction or gangrene: Secondary | ICD-10-CM

## 2023-03-29 ENCOUNTER — Ambulatory Visit (INDEPENDENT_AMBULATORY_CARE_PROVIDER_SITE_OTHER): Payer: Managed Care, Other (non HMO)

## 2023-03-29 DIAGNOSIS — J309 Allergic rhinitis, unspecified: Secondary | ICD-10-CM | POA: Diagnosis not present

## 2023-04-01 ENCOUNTER — Ambulatory Visit
Admission: RE | Admit: 2023-04-01 | Discharge: 2023-04-01 | Disposition: A | Discharge status: Home / Self Care | Payer: Managed Care, Other (non HMO) | Source: Ambulatory Visit | Attending: General Surgery

## 2023-04-01 DIAGNOSIS — K429 Umbilical hernia without obstruction or gangrene: Secondary | ICD-10-CM

## 2023-04-08 ENCOUNTER — Other Ambulatory Visit: Payer: Self-pay | Admitting: Family Medicine

## 2023-04-13 ENCOUNTER — Encounter (HOSPITAL_COMMUNITY): Payer: Self-pay | Admitting: Psychiatry

## 2023-04-13 ENCOUNTER — Telehealth (INDEPENDENT_AMBULATORY_CARE_PROVIDER_SITE_OTHER): Payer: 59 | Admitting: Psychiatry

## 2023-04-13 DIAGNOSIS — F5102 Adjustment insomnia: Secondary | ICD-10-CM | POA: Diagnosis not present

## 2023-04-13 DIAGNOSIS — F411 Generalized anxiety disorder: Secondary | ICD-10-CM | POA: Diagnosis not present

## 2023-04-13 DIAGNOSIS — F900 Attention-deficit hyperactivity disorder, predominantly inattentive type: Secondary | ICD-10-CM

## 2023-04-13 DIAGNOSIS — F4310 Post-traumatic stress disorder, unspecified: Secondary | ICD-10-CM

## 2023-04-13 MED ORDER — AMPHETAMINE-DEXTROAMPHETAMINE 15 MG PO TABS: 15.0000 mg | ORAL_TABLET | Freq: Every day | ORAL | 0 refills | Status: DC

## 2023-04-13 NOTE — Progress Notes (Signed)
[facility] Follow up visit  Patient Identification: Barbara Sellers MRN:  [id number] Date of Evaluation:  04/13/2023 Referral Source: primary care Chief Complaint:   Follow up adhd, anxiety   Visit Diagnosis:    ICD-10-CM   1. PTSD (post-traumatic stress disorder)  F43.10     2. GAD (generalized anxiety disorder)  F41.1     3. Adjustment insomnia  F51.02     4. Attention deficit hyperactivity disorder (ADHD), predominantly inattentive type  F90.0 amphetamine-dextroamphetamine (ADDERALL) 15 MG tablet     Virtual Visit via Video Note  I connected with Barbara Sellers on 04/13/23 at  2:00 PM EDT by a video enabled telemedicine application and verified that I am speaking with the correct person using two identifiers.  Location: Patient: work Provider: home office   I discussed the limitations of evaluation and management by telemedicine and the availability of in person appointments. The patient expressed understanding and agreed to proceed.     I discussed the assessment and treatment plan with the patient. The patient was provided an opportunity to ask questions and all were answered. The patient agreed with the plan and demonstrated an understanding of the instructions.   The patient was advised to call back or seek an in-person evaluation if the symptoms worsen or if the condition fails to improve as anticipated.  I provided 20 minutes of non-face-to-face time during this encounter.    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  Divorce,  finances Modifying factors; dogs  Duration since age 49  Severity gets distracted, feels dose  is low  Past Psychiatric History: PTSD, Adhd  Previous Psychotropic Medications: Yes   Substance Abuse History in the last 12 months:  No.  Consequences of Substance Abuse: Drinks one a week, denies concerns  Past Medical History:  Past Medical History:  Diagnosis Date   ADHD (attention deficit hyperactivity disorder)    Asthma    Depression    Eczema    Family history of breast cancer    Family history of melanoma    Family history of pancreatic cancer    Family history of prostate cancer    Migraines    Ocular migraine 10/23/2019   PTSD (post-traumatic stress disorder)    Recurrent upper respiratory infection (URI)    Rheumatoid arthritis (HCC)    Seasonal allergies    Swine flu 2009   Urticaria    Vertigo     Past Surgical History:  Procedure Laterality Date   APPENDECTOMY  1996   lasik Bilateral    ORIF DISTAL RADIUS FRACTURE     WISDOM TOOTH EXTRACTION        Family History:  Family History  Problem Relation Age of Onset   Macular degeneration Mother    Urticaria Mother    Colon polyps Father    Diabetes Father    Diverticulitis Father    Cancer Father    Cancer - Other Father        Adenocarcinoma   Allergic rhinitis Father    Asthma Father    Eczema Father    Allergic rhinitis Brother    Arrhythmia Brother  half-brother   Allergic rhinitis Brother    Diabetes Maternal Aunt    Allergic rhinitis Maternal Aunt    Cancer Maternal Grandmother        SKIN   Cancer Maternal Grandfather        SKIN   Hypertension Paternal Grandmother    Skin cancer Paternal Grandmother    Allergic rhinitis Paternal Grandmother    Asthma Paternal Grandmother    Hypertension Paternal Grandfather    Cancer Paternal Grandfather        melanoma, kidney, bladder, prostate, lung, liver cancer   Skin cancer Paternal Grandfather    Melanoma Paternal Aunt    Allergic rhinitis Paternal Aunt    Asthma Paternal Aunt    Pancreatic cancer Other    Breast cancer  Maternal Aunt 59   Allergic rhinitis Maternal Aunt    Pancreatic cancer Maternal Aunt    Allergic rhinitis Maternal Aunt    Allergic rhinitis Maternal Aunt    Asthma Maternal Aunt    Melanoma Paternal Aunt    Allergic rhinitis Paternal Aunt    Asthma Paternal Aunt    Melanoma Other     Social History:   Social History   Socioeconomic History   Marital status: Legally Separated    Spouse name: Not on file   Number of children: Not on file   Years of education: Not on file   Highest education level: Not on file  Occupational History   Occupation: Consulting civil engineer   Occupation: POLICE OFFICER    Employer: [employer]  Tobacco Use   Smoking status: Former    Types: Cigarettes   Smokeless tobacco: Never  Vaping Use   Vaping status: Never Used  Substance and Sexual Activity   Alcohol use: Yes    Alcohol/week: 2.0 standard drinks of alcohol    Types: 2 Standard drinks or equivalent per week    Comment: rare   Drug use: Never   Sexual activity: Yes    Partners: Female    Comment: Same sex marriage  Other Topics Concern   Not on file  Social History Narrative   She exercises at the gym and does kickboxing and soccer.   Social Determinants of Health   Financial Resource Strain: Not on file  Food Insecurity: No Food Insecurity (01/28/2021)   Received from [facility]   Hunger Vital Sign    Worried About Running Out of Food in the Last Year: Never true    Ran Out of Food in the Last Year: Never true  Transportation Needs: Not on file  Physical Activity: Not on file  Stress: Not on file  Social Connections: Unknown (10/26/2021)   Received from [facility]   Social Network    Social Network: Not on file     Allergies:   Allergies  Allergen Reactions   Cayenne Anaphylaxis   Azithromycin Itching and Rash    Also burning.   Also burning. Also burning.   Doxycycline Other (See Comments) and Swelling    Esophagus issues. "felt like razor  blades" Throat swelling  Other reaction(s): Other (See Comments) Throat swelling Esophagus issues. "felt like razor blades" Esophagus issues. "felt like razor blades" Throat swelling   Doxycycline Calcium Other (See Comments)   Buspar [Buspirone] Anxiety    Rx caused muscle spasms    Metabolic Disorder Labs: No results found for: "HGBA1C", "MPG" No results found for: "PROLACTIN" No results found for: "CHOL", "TRIG", "HDL", "CHOLHDL", "VLDL", "LDLCALC" Lab Results  Component Value Date   TSH 1.33 10/07/2021    Therapeutic Level Labs: No results found for: "LITHIUM" No results found for: "CBMZ" No results found for: "VALPROATE"  Current Medications: Current Outpatient Medications  Medication Sig Dispense Refill   albuterol (VENTOLIN HFA) 108 (90 Base) MCG/ACT inhaler Inhale 1-2 puffs into the lungs every 4 (four) hours as needed for wheezing or shortness of breath. 8 g 1   amphetamine-dextroamphetamine (ADDERALL) 15 MG tablet Take 1 tablet by mouth daily. 30 tablet 0   pantoprazole (PROTONIX) 40 MG tablet TAKE 1 TABLET BY MOUTH EVERY DAY 90 tablet 0   predniSONE (STERAPRED UNI-PAK 21 TAB) 10 MG (21) TBPK tablet Take by mouth daily. Take 6 tabs by mouth daily  for 2 days, then 5 tabs for 2 days, then 4 tabs for 2 days, then 3 tabs for 2 days, 2 tabs for 2 days, then 1 tab by mouth daily for 2 days 42 tablet 0   sertraline (ZOLOFT) 100 MG tablet TAKE 1 TABLET BY MOUTH EVERY DAY 90 tablet 0   sertraline (ZOLOFT) 25 MG tablet TAKE 1 TABLET (25 MG TOTAL) BY MOUTH DAILY. 90 tablet 0   traZODone (DESYREL) 50 MG tablet TAKE 1.5 TABLETS BY MOUTH AT BEDTIME AS NEEDED. FOR SLEEP *INS REQUIRES A 90 DAY SUPPLY* 135 tablet 0   No current facility-administered medications for this visit.     Psychiatric Specialty Exam: Review of Systems  Cardiovascular:  Negative for chest pain.  Neurological:  Negative for tremors.  Psychiatric/Behavioral:  Negative for agitation, hallucinations and  self-injury. The patient is nervous/anxious.     There were no vitals taken for this visit.There is no height or weight on file to calculate BMI.  General Appearance: Neat  Eye Contact:  Good  Speech:  Clear and Coherent  Volume:  Normal  Mood:  fair  Affect:  Congruent  Thought Process:  Goal Directed  Orientation:  Full (Time, Place, and Person)  Thought Content:  Rumination  Suicidal Thoughts:  No  Homicidal Thoughts:  No  Memory:  Immediate;   Fair  Judgement:  Fair  Insight:  Fair  Psychomotor Activity:  Normal  Concentration:  Concentration: Fair  Recall:  Fair  Fund of Knowledge:Good  Language: Good  Akathisia:  No  Handed:    AIMS (if indicated):  not done  Assets:  Communication Skills Desire for Improvement Housing  ADL's:  Intact  Cognition: WNL  Sleep:  variable   Screenings: GAD-7    Flowsheet Row Office Visit from 10/23/2019 in [facility] at [facility]  Total GAD-7 Score 11      PHQ2-9    Flowsheet Row Office Visit from 09/28/2021 in [facility] at [facility] Office Visit from 10/23/2019 in [facility] & [facility] Medicine at [facility]  PHQ-2 Total Score 1 4  PHQ-9 Total Score -- 20      Flowsheet Row ED from 02/14/2023 in [facility] at [facility] Video Visit from 04/05/2022 in [facility] at [facility] Video Visit from 01/08/2022 in [facility] Out[facility] Behavior[facility] at [facility]  C-SSRS RISK CATEGORY No Risk No Risk No Risk        Assessment and Plan: as follows  Prior documentation reviewed  ADHD; gets distracted and has to double the dose at times we will increase from 10 to 15mg . Take drug holidays Generalized anxiety disorder:manageble with zoloft 125mg   continue    Insomnia; irregular without trazadone, reveiewed sleep hygiene and it helps   PTSD; baseline  continue zolot Fu 81m.    Collaboration of Care: Medication Management AEB med review and referral papers reviewed,   Patient/Guardian was advised Release of Information must be obtained prior to any record release in order to collaborate their care with an outside provider. Patient/Guardian was advised if they have not already done so to contact the registration department to sign all necessary forms in order for [place] to release information regarding their care.   Consent: Patient/Guardian gives verbal consent for treatment and assignment of benefits for services provided during this visit. Patient/Guardian expressed understanding and agreed to proceed.   Thresa Ross, MD 10/16/20242:11 PM

## 2023-04-19 ENCOUNTER — Ambulatory Visit (INDEPENDENT_AMBULATORY_CARE_PROVIDER_SITE_OTHER): Payer: Managed Care, Other (non HMO)

## 2023-04-19 DIAGNOSIS — J309 Allergic rhinitis, unspecified: Secondary | ICD-10-CM

## 2023-05-05 ENCOUNTER — Other Ambulatory Visit (HOSPITAL_COMMUNITY): Payer: Self-pay | Admitting: Psychiatry

## 2023-05-05 DIAGNOSIS — F4310 Post-traumatic stress disorder, unspecified: Secondary | ICD-10-CM

## 2023-05-10 ENCOUNTER — Ambulatory Visit (INDEPENDENT_AMBULATORY_CARE_PROVIDER_SITE_OTHER): Payer: Managed Care, Other (non HMO)

## 2023-05-10 DIAGNOSIS — J309 Allergic rhinitis, unspecified: Secondary | ICD-10-CM

## 2023-05-19 NOTE — Progress Notes (Signed)
VIALS EXP 05-18-24

## 2023-05-20 DIAGNOSIS — J301 Allergic rhinitis due to pollen: Secondary | ICD-10-CM | POA: Diagnosis not present

## 2023-05-23 ENCOUNTER — Telehealth (HOSPITAL_COMMUNITY): Payer: Self-pay | Admitting: *Deleted

## 2023-05-23 DIAGNOSIS — F900 Attention-deficit hyperactivity disorder, predominantly inattentive type: Secondary | ICD-10-CM

## 2023-05-23 NOTE — Telephone Encounter (Signed)
Patient Refill Request amphetamine-dextroamphetamine (ADDERALL) 15 MG tablet   [id number] - [place], [place] - [address]   Last Appt 07/13/23 Next Appt 04/13/23

## 2023-05-24 MED ORDER — AMPHETAMINE-DEXTROAMPHETAMINE 15 MG PO TABS
15.0000 mg | ORAL_TABLET | Freq: Every day | ORAL | 0 refills | Status: DC
Start: 2023-05-24 — End: 2023-07-14

## 2023-05-24 NOTE — Addendum Note (Signed)
Addended by: Thresa Ross on: 05/24/2023 08:49 AM   Modules accepted: Orders

## 2023-05-31 ENCOUNTER — Ambulatory Visit (INDEPENDENT_AMBULATORY_CARE_PROVIDER_SITE_OTHER): Payer: Managed Care, Other (non HMO)

## 2023-05-31 DIAGNOSIS — J309 Allergic rhinitis, unspecified: Secondary | ICD-10-CM

## 2023-06-07 ENCOUNTER — Encounter: Payer: Self-pay | Admitting: Family Medicine

## 2023-06-07 ENCOUNTER — Ambulatory Visit: Payer: Managed Care, Other (non HMO) | Admitting: Family Medicine

## 2023-06-07 VITALS — BP 100/63 | HR 75 | Ht 61.0 in | Wt 138.7 lb

## 2023-06-07 DIAGNOSIS — J189 Pneumonia, unspecified organism: Secondary | ICD-10-CM | POA: Diagnosis not present

## 2023-06-07 MED ORDER — ALBUTEROL SULFATE HFA 108 (90 BASE) MCG/ACT IN AERS: 2.0000 | INHALATION_SPRAY | Freq: Four times a day (QID) | RESPIRATORY_TRACT | 1 refills | Status: AC | PRN

## 2023-06-07 MED ORDER — LEVOFLOXACIN 750 MG PO TABS: 750.0000 mg | ORAL_TABLET | Freq: Every day | ORAL | 0 refills | Status: AC

## 2023-06-07 MED ORDER — PREDNISONE 20 MG PO TABS: 20.0000 mg | ORAL_TABLET | Freq: Every day | ORAL | 0 refills | Status: DC

## 2023-06-07 NOTE — Progress Notes (Signed)
Barbara Sellers - 32 y.o. female MRN [id number]  Date of birth: 24-Jun-1991  Subjective Chief Complaint  Patient presents with   URI    HPI Barbara Sellers is a 32 y.o. female here today with complaint of cough, congestion, wheezing, and dyspnea.  Symptoms started about 10 days ago.  She has had fever and sweats with this.  She started on augmentin that she had left over at home and has been taking for about 8 days.  She has not really noted a whole lot of improvement with this.  She has been exposed to individuals dx with walking pneumonia.  She denies body aches, headache or GI symptoms.  She has used old inhaler which has helped some.   ROS:  A comprehensive ROS was completed and negative except as noted per HPI  Allergies  Allergen Reactions   Cayenne Anaphylaxis   Azithromycin Itching and Rash    Also burning.   Also burning. Also burning.   Doxycycline Other (See Comments) and Swelling    Esophagus issues. "felt like razor blades" Throat swelling  Other reaction(s): Other (See Comments) Throat swelling Esophagus issues. "felt like razor blades" Esophagus issues. "felt like razor blades" Throat swelling   Doxycycline Calcium Other (See Comments)   Buspar [Buspirone] Anxiety    Rx caused muscle spasms    Past Medical History:  Diagnosis Date   ADHD (attention deficit hyperactivity disorder)    Asthma    Depression    Eczema    Family history of breast cancer    Family history of melanoma    Family history of pancreatic cancer    Family history of prostate cancer    Migraines    Ocular migraine 10/23/2019   PTSD (post-traumatic stress disorder)    Recurrent upper respiratory infection (URI)    Rheumatoid arthritis (HCC)    Seasonal allergies    Swine flu 2009   Urticaria    Vertigo     Past Surgical History:  Procedure Laterality Date   APPENDECTOMY  1996   lasik Bilateral    ORIF DISTAL RADIUS FRACTURE     WISDOM TOOTH EXTRACTION      Social History    Socioeconomic History   Marital status: Legally Separated    Spouse name: Not on file   Number of children: Not on file   Years of education: Not on file   Highest education level: Not on file  Occupational History   Occupation: student   Occupation: POLICE OFFICER    Employer: [employer]  Tobacco Use   Smoking status: Former    Types: Cigarettes   Smokeless tobacco: Never  Vaping Use   Vaping status: Never Used  Substance and Sexual Activity   Alcohol use: Yes    Alcohol/week: 2.0 standard drinks of alcohol    Types: 2 Standard drinks or equivalent per week    Comment: rare   Drug use: Never   Sexual activity: Yes    Partners: Female    Comment: Same sex marriage  Other Topics Concern   Not on file  Social History Narrative   She exercises at the gym and does kickboxing and soccer.   Social Determinants of Health   Financial Resource Strain: Not on file  Food Insecurity: No Food Insecurity (01/28/2021)   Received from [facility], [facility]   Hunger Vital Sign    Worried About Running Out of Food in the Last Year: Never true    Ran Out of  Food in the Last Year: Never true  Transportation Needs: Not on file  Physical Activity: Not on file  Stress: Not on file  Social Connections: Unknown (10/26/2021)   Received from [facility], [facility]   Social Network    Social Network: Not on file    Family History  Problem Relation Age of Onset   Macular degeneration Mother    Urticaria Mother    Colon polyps Father    Diabetes Father    Diverticulitis Father    Cancer Father    Cancer - Other Father        Adenocarcinoma   Allergic rhinitis Father    Asthma Father    Eczema Father    Allergic rhinitis Brother    Arrhythmia Brother        half-brother   Allergic rhinitis Brother    Diabetes Maternal Aunt    Allergic rhinitis Maternal Aunt    Cancer Maternal Grandmother        SKIN   Cancer Maternal Grandfather        SKIN   Hypertension Paternal  Grandmother    Skin cancer Paternal Grandmother    Allergic rhinitis Paternal Grandmother    Asthma Paternal Grandmother    Hypertension Paternal Grandfather    Cancer Paternal Grandfather        melanoma, kidney, bladder, prostate, lung, liver cancer   Skin cancer Paternal Grandfather    Melanoma Paternal Aunt    Allergic rhinitis Paternal Aunt    Asthma Paternal Aunt    Pancreatic cancer Other    Breast cancer Maternal Aunt 59   Allergic rhinitis Maternal Aunt    Pancreatic cancer Maternal Aunt    Allergic rhinitis Maternal Aunt    Allergic rhinitis Maternal Aunt    Asthma Maternal Aunt    Melanoma Paternal Aunt    Allergic rhinitis Paternal Aunt    Asthma Paternal Aunt    Melanoma Other     Health Maintenance  Topic Date Due   HIV Screening  Never done   Hepatitis C Screening  Never done   INFLUENZA VACCINE  01/27/2023   COVID-19 Vaccine (3 - 2023-24 season) 02/27/2023   Cervical Cancer Screening (HPV/Pap Cotest)  09/09/2025   DTaP/Tdap/Td (2 - Td or Tdap) 04/01/2029   HPV VACCINES  Aged Out     ----------------------------------------------------------------------------------------------------------------------------------------------------------------------------------------------------------------- Physical Exam BP 100/63 (BP Location: Left Arm, Patient Position: Sitting, Cuff Size: Normal)   Pulse 75   Ht 5\' 1"  (1.549 m)   Wt 138 lb 11.2 oz (62.9 kg)   SpO2 96%   BMI 26.21 kg/m   Physical Exam Constitutional:      Appearance: Normal appearance.  Eyes:     General: No scleral icterus. Cardiovascular:     Rate and Rhythm: Normal rate and regular rhythm.  Pulmonary:     Effort: Pulmonary effort is normal.     Comments: Scattered rhonchi.  Musculoskeletal:     Cervical back: Neck supple.  Neurological:     General: No focal deficit present.     Mental Status: She is alert.  Psychiatric:        Mood and Affect: Mood normal.        Behavior: Behavior  normal.     ------------------------------------------------------------------------------------------------------------------------------------------------------------------------------------------------------------------- Assessment and Plan  Atypical pneumonia Strong suspicion of atypical pneumonia given exposure, history and exam findings.  She has allergies to azithromycin and doxycycline.  Discussed with her that augmentin doesn't have good activity against mycoplasma species.  Will  treat with levaquin 750mg  x5 days and burst of prednisone.  Albuterol inhaler renewed.  Precautions and red flags reviewed.    Meds ordered this encounter  Medications   predniSONE (DELTASONE) 20 MG tablet    Sig: Take 1 tablet (20 mg total) by mouth daily with breakfast.    Dispense:  10 tablet    Refill:  0   levofloxacin (LEVAQUIN) 750 MG tablet    Sig: Take 1 tablet (750 mg total) by mouth daily for 5 days.    Dispense:  5 tablet    Refill:  0   albuterol (VENTOLIN HFA) 108 (90 Base) MCG/ACT inhaler    Sig: Inhale 2 puffs into the lungs every 6 (six) hours as needed for wheezing or shortness of breath.    Dispense:  8 g    Refill:  1    No follow-ups on file.    This visit occurred during the SARS-CoV-2 public health emergency.  Safety protocols were in place, including screening questions prior to the visit, additional usage of staff PPE, and extensive cleaning of exam room while observing appropriate contact time as indicated for disinfecting solutions.

## 2023-06-07 NOTE — Assessment & Plan Note (Signed)
Strong suspicion of atypical pneumonia given exposure, history and exam findings.  She has allergies to azithromycin and doxycycline.  Discussed with her that augmentin doesn't have good activity against mycoplasma species.  Will treat with levaquin 750mg  x5 days and burst of prednisone.  Albuterol inhaler renewed.  Precautions and red flags reviewed.

## 2023-06-07 NOTE — Patient Instructions (Signed)
Stop augmentin. .  Start levofloxacin.  Start prednisone Stay well hydrated.  Let me know if symptoms worsen.

## 2023-06-16 ENCOUNTER — Other Ambulatory Visit (HOSPITAL_COMMUNITY): Payer: Self-pay | Admitting: Psychiatry

## 2023-06-30 ENCOUNTER — Ambulatory Visit (INDEPENDENT_AMBULATORY_CARE_PROVIDER_SITE_OTHER): Payer: Managed Care, Other (non HMO)

## 2023-06-30 DIAGNOSIS — J309 Allergic rhinitis, unspecified: Secondary | ICD-10-CM | POA: Diagnosis not present

## 2023-07-09 ENCOUNTER — Other Ambulatory Visit: Payer: Self-pay | Admitting: Family Medicine

## 2023-07-11 NOTE — Telephone Encounter (Signed)
 Requesting rx rf of pantoprazole 40mg   Last written 04/08/2023 Last OV 06/07/2023 ( sick visit)  Upcoming appt 07/13/23

## 2023-07-13 ENCOUNTER — Ambulatory Visit (INDEPENDENT_AMBULATORY_CARE_PROVIDER_SITE_OTHER): Payer: Managed Care, Other (non HMO) | Admitting: Family Medicine

## 2023-07-13 ENCOUNTER — Encounter: Payer: Self-pay | Admitting: Family Medicine

## 2023-07-13 ENCOUNTER — Telehealth (HOSPITAL_COMMUNITY): Payer: Self-pay | Admitting: *Deleted

## 2023-07-13 ENCOUNTER — Telehealth (HOSPITAL_COMMUNITY): Payer: 59 | Admitting: Psychiatry

## 2023-07-13 VITALS — BP 100/66 | HR 89 | Resp 20 | Ht 61.0 in | Wt 141.0 lb

## 2023-07-13 DIAGNOSIS — Z13220 Encounter for screening for lipoid disorders: Secondary | ICD-10-CM | POA: Diagnosis not present

## 2023-07-13 DIAGNOSIS — F900 Attention-deficit hyperactivity disorder, predominantly inattentive type: Secondary | ICD-10-CM

## 2023-07-13 DIAGNOSIS — Z0000 Encounter for general adult medical examination without abnormal findings: Secondary | ICD-10-CM | POA: Insufficient documentation

## 2023-07-13 NOTE — Progress Notes (Signed)
 Barbara Sellers - 33 y.o. female MRN [id number]  Date of birth: January 11, 1991  Subjective Chief Complaint  Patient presents with   Annual Exam    HPI Barbara Sellers is a 33 y.o. female here today for annual exam.    She reports that she is doing well.    She continues to stay moderately active.  She feels that diet is pretty good.   Non-smoker.  Occasional EtOH.   Review of Systems  Constitutional:  Negative for chills, fever, malaise/fatigue and weight loss.  HENT:  Negative for congestion, ear pain and sore throat.   Eyes:  Negative for blurred vision, double vision and pain.  Respiratory:  Negative for cough and shortness of breath.   Cardiovascular:  Negative for chest pain and palpitations.  Gastrointestinal:  Negative for abdominal pain, blood in stool, constipation, heartburn and nausea.  Genitourinary:  Negative for dysuria and urgency.  Musculoskeletal:  Negative for joint pain and myalgias.  Neurological:  Negative for dizziness and headaches.  Endo/Heme/Allergies:  Does not bruise/bleed easily.  Psychiatric/Behavioral:  Negative for depression. The patient is not nervous/anxious and does not have insomnia.     Allergies  Allergen Reactions   Cayenne Anaphylaxis   Azithromycin Itching and Rash    Also burning.   Also burning. Also burning.   Doxycycline  Other (See Comments) and Swelling    Esophagus issues. "felt like razor blades" Throat swelling  Other reaction(s): Other (See Comments) Throat swelling Esophagus issues. "felt like razor blades" Esophagus issues. "felt like razor blades" Throat swelling   Doxycycline  Calcium Other (See Comments)   Buspar [Buspirone] Anxiety    Rx caused muscle spasms    Past Medical History:  Diagnosis Date   ADHD (attention deficit hyperactivity disorder)    Asthma    Depression    Eczema    Family history of breast cancer    Family history of melanoma    Family history of pancreatic cancer    Family history of  prostate cancer    Migraines    Ocular migraine 10/23/2019   PTSD (post-traumatic stress disorder)    Recurrent upper respiratory infection (URI)    Rheumatoid arthritis (HCC)    Seasonal allergies    Swine flu 2009   Urticaria    Vertigo     Past Surgical History:  Procedure Laterality Date   APPENDECTOMY  1996   lasik Bilateral    ORIF DISTAL RADIUS FRACTURE     WISDOM TOOTH EXTRACTION      Social History   Socioeconomic History   Marital status: Legally Separated    Spouse name: Not on file   Number of children: Not on file   Years of education: Not on file   Highest education level: Not on file  Occupational History   Occupation: student   Occupation: POLICE OFFICER    Employer: [employer]  Tobacco Use   Smoking status: Former    Types: Cigarettes   Smokeless tobacco: Never  Vaping Use   Vaping status: Never Used  Substance and Sexual Activity   Alcohol use: Yes    Alcohol/week: 2.0 standard drinks of alcohol    Types: 2 Standard drinks or equivalent per week    Comment: rare   Drug use: Never   Sexual activity: Yes    Partners: Female    Comment: Same sex marriage  Other Topics Concern   Not on file  Social History Narrative   She exercises at the gym and does  kickboxing and soccer.   Social Drivers of Health   Financial Resource Strain: Patient Declined (07/11/2023)   Received from [facility]   Overall Financial Resource Strain (CARDIA)    Difficulty of Paying Living Expenses: Patient declined  Food Insecurity: Patient Declined (07/11/2023)   Received from [facility]   Hunger Vital Sign    Worried About Running Out of Food in the Last Year: Patient declined    Ran Out of Food in the Last Year: Patient declined  Transportation Needs: Patient Declined (07/11/2023)   Received from [facility]    Lack of Transportation (Medical): Patient declined    Lack of Transportation (Non-Medical): Patient declined  Physical  Activity: Not on file  Stress: Not on file  Social Connections: Unknown (10/26/2021)   Received from [facility], [facility]   Social Network    Social Network: Not on file    Family History  Problem Relation Age of Onset   Macular degeneration Mother    Urticaria Mother    Colon polyps Father    Diabetes Father    Diverticulitis Father    Cancer Father    Cancer - Other Father        Adenocarcinoma   Allergic rhinitis Father    Asthma Father    Eczema Father    Allergic rhinitis Brother    Arrhythmia Brother        half-brother   Allergic rhinitis Brother    Diabetes Maternal Aunt    Allergic rhinitis Maternal Aunt    Cancer Maternal Grandmother        SKIN   Cancer Maternal Grandfather        SKIN   Hypertension Paternal Grandmother    Skin cancer Paternal Grandmother    Allergic rhinitis Paternal Grandmother    Asthma Paternal Grandmother    Hypertension Paternal Grandfather    Cancer Paternal Grandfather        melanoma, kidney, bladder, prostate, lung, liver cancer   Skin cancer Paternal Grandfather    Melanoma Paternal Aunt    Allergic rhinitis Paternal Aunt    Asthma Paternal Aunt    Pancreatic cancer Other    Breast cancer Maternal Aunt 59   Allergic rhinitis Maternal Aunt    Pancreatic cancer Maternal Aunt    Allergic rhinitis Maternal Aunt    Allergic rhinitis Maternal Aunt    Asthma Maternal Aunt    Melanoma Paternal Aunt    Allergic rhinitis Paternal Aunt    Asthma Paternal Aunt    Melanoma Other     Health Maintenance  Topic Date Due   HIV Screening  Never done   Hepatitis C Screening  Never done   COVID-19 Vaccine (3 - 2024-25 season) 07/29/2023 (Originally 02/27/2023)   INFLUENZA VACCINE  09/26/2023 (Originally 01/27/2023)   Pneumococcal Vaccine 8-60 Years old (1 of 2 - PCV) 07/12/2024 (Originally 05/17/1995)   Cervical Cancer Screening (HPV/Pap Cotest)  09/09/2025   DTaP/Tdap/Td (2 - Td or Tdap) 04/01/2029   HPV VACCINES  Aged Out      ----------------------------------------------------------------------------------------------------------------------------------------------------------------------------------------------------------------- Physical Exam BP 100/66 (BP Location: Left Arm, Cuff Size: Normal)   Pulse 89   Resp 20   Ht 5\' 1"  (1.549 m)   Wt 141 lb 0.6 oz (64 kg)   SpO2 98%   BMI 26.65 kg/m   Physical Exam Constitutional:      General: She is not in acute distress. HENT:     Head: Normocephalic and atraumatic.  Right Ear: Tympanic membrane and ear canal normal.     Left Ear: Tympanic membrane and ear canal normal.     Nose: Nose normal.  Eyes:     General: No scleral icterus.    Conjunctiva/sclera: Conjunctivae normal.  Neck:     Thyroid : No thyromegaly.  Cardiovascular:     Rate and Rhythm: Normal rate and regular rhythm.     Heart sounds: Normal heart sounds.  Pulmonary:     Effort: Pulmonary effort is normal.     Breath sounds: Normal breath sounds.  Abdominal:     General: Bowel sounds are normal. There is no distension.     Palpations: Abdomen is soft.     Tenderness: There is no abdominal tenderness. There is no guarding.  Musculoskeletal:        General: Normal range of motion.     Cervical back: Normal range of motion and neck supple.  Lymphadenopathy:     Cervical: No cervical adenopathy.  Skin:    General: Skin is warm and dry.     Findings: No rash.  Neurological:     General: No focal deficit present.     Mental Status: She is alert and oriented to person, place, and time.     Cranial Nerves: No cranial nerve deficit.     Coordination: Coordination normal.  Psychiatric:        Mood and Affect: Mood normal.        Behavior: Behavior normal.      ------------------------------------------------------------------------------------------------------------------------------------------------------------------------------------------------------------------- Assessment and Plan  No problem-specific Assessment & Plan notes found for this encounter.   No orders of the defined types were placed in this encounter.   No follow-ups on file.    This visit occurred during the SARS-CoV-2 public health emergency.  Safety protocols were in place, including screening questions prior to the visit, additional usage of staff PPE, and extensive cleaning of exam room while observing appropriate contact time as indicated for disinfecting solutions.

## 2023-07-13 NOTE — Telephone Encounter (Signed)
 REFILL REQUEST   [id number] - [place], [place] - [address]  amphetamine -dextroamphetamine  (ADDERALL) 15 MG tablet   Last Appt  07/13/23 Next Appt   ??

## 2023-07-13 NOTE — Assessment & Plan Note (Signed)
 Well adult Orders Placed This Encounter  Procedures   CMP14+EGFR   CBC with Differential/Platelet   Lipid Panel With LDL/HDL Ratio  Screening: per lab orders Immunizations:  UTD Anticipatory guidance/Risk factor reduction:  Recommendations per AVS

## 2023-07-13 NOTE — Telephone Encounter (Signed)
 OPENED IN ERROR

## 2023-07-13 NOTE — Patient Instructions (Signed)

## 2023-07-14 LAB — CMP14+EGFR
ALT: 13 [IU]/L (ref 0–32)
AST: 15 [IU]/L (ref 0–40)
Albumin: 4.7 g/dL (ref 3.9–4.9)
Alkaline Phosphatase: 78 [IU]/L (ref 44–121)
BUN/Creatinine Ratio: 13 (ref 9–23)
BUN: 10 mg/dL (ref 6–20)
Bilirubin Total: 0.5 mg/dL (ref 0.0–1.2)
CO2: 24 mmol/L (ref 20–29)
Calcium: 10 mg/dL (ref 8.7–10.2)
Chloride: 99 mmol/L (ref 96–106)
Creatinine, Ser: 0.77 mg/dL (ref 0.57–1.00)
Globulin, Total: 2.5 g/dL (ref 1.5–4.5)
Glucose: 93 mg/dL (ref 70–99)
Potassium: 4.2 mmol/L (ref 3.5–5.2)
Sodium: 137 mmol/L (ref 134–144)
Total Protein: 7.2 g/dL (ref 6.0–8.5)
eGFR: 104 mL/min/{1.73_m2} (ref >59)

## 2023-07-14 LAB — CBC WITH DIFFERENTIAL/PLATELET
Basophils Absolute: 0 10*3/uL (ref 0.0–0.2)
Basos: 1 %
EOS (ABSOLUTE): 0 10*3/uL (ref 0.0–0.4)
Eos: 1 %
Hematocrit: 43.7 % (ref 34.0–46.6)
Hemoglobin: 14.1 g/dL (ref 11.1–15.9)
Immature Grans (Abs): 0 10*3/uL (ref 0.0–0.1)
Immature Granulocytes: 0 %
Lymphocytes Absolute: 2.5 10*3/uL (ref 0.7–3.1)
Lymphs: 28 %
MCH: 28.1 pg (ref 26.6–33.0)
MCHC: 32.3 g/dL (ref 31.5–35.7)
MCV: 87 fL (ref 79–97)
Monocytes Absolute: 0.7 10*3/uL (ref 0.1–0.9)
Monocytes: 8 %
Neutrophils Absolute: 5.5 10*3/uL (ref 1.4–7.0)
Neutrophils: 62 %
Platelets: 346 10*3/uL (ref 150–450)
RBC: 5.02 x10E6/uL (ref 3.77–5.28)
RDW: 12.4 % (ref 11.7–15.4)
WBC: 8.8 10*3/uL (ref 3.4–10.8)

## 2023-07-14 LAB — LIPID PANEL WITH LDL/HDL RATIO
Cholesterol, Total: 203 mg/dL — ABNORMAL HIGH (ref 100–199)
HDL: 70 mg/dL (ref >39)
LDL Chol Calc (NIH): 122 mg/dL — ABNORMAL HIGH (ref 0–99)
LDL/HDL Ratio: 1.7 {ratio} (ref 0.0–3.2)
Triglycerides: 62 mg/dL (ref 0–149)
VLDL Cholesterol Cal: 11 mg/dL (ref 5–40)

## 2023-07-14 MED ORDER — AMPHETAMINE-DEXTROAMPHETAMINE 15 MG PO TABS: 15.0000 mg | ORAL_TABLET | Freq: Every day | ORAL | 0 refills | Status: DC

## 2023-07-14 NOTE — Addendum Note (Signed)
Addended by: Thresa Ross on: 07/14/2023 08:31 AM   Modules accepted: Orders

## 2023-07-15 ENCOUNTER — Encounter: Payer: Self-pay | Admitting: Family Medicine

## 2023-07-19 ENCOUNTER — Ambulatory Visit (INDEPENDENT_AMBULATORY_CARE_PROVIDER_SITE_OTHER): Payer: Managed Care, Other (non HMO)

## 2023-07-19 DIAGNOSIS — J309 Allergic rhinitis, unspecified: Secondary | ICD-10-CM

## 2023-08-01 ENCOUNTER — Encounter (HOSPITAL_COMMUNITY): Payer: Self-pay | Admitting: Psychiatry

## 2023-08-01 ENCOUNTER — Telehealth (INDEPENDENT_AMBULATORY_CARE_PROVIDER_SITE_OTHER): Payer: 59 | Admitting: Psychiatry

## 2023-08-01 DIAGNOSIS — F411 Generalized anxiety disorder: Secondary | ICD-10-CM

## 2023-08-01 DIAGNOSIS — F900 Attention-deficit hyperactivity disorder, predominantly inattentive type: Secondary | ICD-10-CM

## 2023-08-01 DIAGNOSIS — F4310 Post-traumatic stress disorder, unspecified: Secondary | ICD-10-CM | POA: Diagnosis not present

## 2023-08-01 MED ORDER — TRAZODONE HCL 50 MG PO TABS
50.0000 mg | ORAL_TABLET | Freq: Every day | ORAL | 0 refills | Status: DC
Start: 2023-08-01 — End: 2023-08-09

## 2023-08-01 MED ORDER — SERTRALINE HCL 25 MG PO TABS: 25.0000 mg | ORAL_TABLET | Freq: Every day | ORAL | 0 refills | Status: DC

## 2023-08-01 MED ORDER — AMPHETAMINE-DEXTROAMPHETAMINE 10 MG PO TABS: 10.0000 mg | ORAL_TABLET | Freq: Every day | ORAL | 0 refills | Status: DC

## 2023-08-01 NOTE — Progress Notes (Signed)
[facility] Follow up visit  Patient Identification: Barbara Sellers MRN:  [id number] Date of Evaluation:  08/01/2023 Referral Source: primary care Chief Complaint:   Follow up adhd, anxiety   Visit Diagnosis:    ICD-10-CM   1. PTSD (post-traumatic stress disorder)  F43.10 traZODone (DESYREL) 50 MG tablet    2. Attention deficit hyperactivity disorder (ADHD), predominantly inattentive type  F90.0     3. GAD (generalized anxiety disorder)  F41.1     Virtual Visit via Video Note  I connected with Barbara Sellers on 08/01/23 at  4:30 PM EST by a video enabled telemedicine application and verified that I am speaking with the correct person using two identifiers.  Location: Patient: home Provider: home office   I discussed the limitations of evaluation and management by telemedicine and the availability of in person appointments. The patient expressed understanding and agreed to proceed.      I discussed the assessment and treatment plan with the patient. The patient was provided an opportunity to ask questions and all were answered. The patient agreed with the plan and demonstrated an understanding of the instructions.   The patient was advised to call back or seek an in-person evaluation if the symptoms worsen or if the condition fails to improve as anticipated.  I provided 18 minutes of non-face-to-face time during this encounter.   History of Present Illness: Patient is a 33 years old married Caucasian female has worked in the police department   Has increased adderall last visit for attention Worries are high at times related to divorce, friends death and jobs hours  Is in therapy , going for seizure evaluation says proably stress related and come at night Will do monitoring, says providers are aware of her meds  Have cut down coffee and trying to work on getting good sleep, is on trazadone  Understands to work down on hours to avoid extra stress   Patient does not no psychotic symptoms  does not also endorse manic symptoms  Aggravating factors: traumatic brain injury .  Divorce,  finances Modifying factors; dogs  Duration since age 62  Severity ; gets stressed  Past Psychiatric History: PTSD, Adhd  Previous Psychotropic Medications: Yes   Substance Abuse History in the last 12 months:  No.  Consequences of Substance Abuse: Drinks one a week, denies concerns  Past Medical History:  Past Medical History:  Diagnosis Date   ADHD (attention deficit hyperactivity disorder)    Asthma    Depression    Eczema    Family history of breast cancer    Family history of melanoma    Family history of pancreatic cancer    Family history of prostate cancer    Migraines    Ocular migraine 10/23/2019   PTSD (post-traumatic stress disorder)    Recurrent upper respiratory infection (URI)    Rheumatoid arthritis (HCC)    Seasonal allergies    Swine flu 2009   Urticaria    Vertigo     Past Surgical History:  Procedure Laterality Date   APPENDECTOMY  1996   lasik Bilateral    ORIF DISTAL RADIUS FRACTURE     WISDOM TOOTH EXTRACTION        Family History:  Family History  Problem Relation Age of Onset   Macular degeneration Mother    Urticaria Mother    Colon polyps Father    Diabetes Father    Diverticulitis Father    Cancer Father    Cancer - Other Father  Adenocarcinoma   Allergic rhinitis Father    Asthma Father    Eczema Father    Allergic rhinitis Brother    Arrhythmia Brother        half-brother   Allergic rhinitis Brother    Diabetes Maternal Aunt    Allergic rhinitis Maternal Aunt    Cancer Maternal Grandmother        SKIN   Cancer Maternal Grandfather        SKIN   Hypertension Paternal Grandmother    Skin cancer Paternal Grandmother    Allergic rhinitis Paternal Grandmother    Asthma Paternal Grandmother    Hypertension Paternal Grandfather    Cancer Paternal Grandfather        melanoma, kidney, bladder, prostate, lung, liver  cancer   Skin cancer Paternal Grandfather    Melanoma Paternal Aunt    Allergic rhinitis Paternal Aunt    Asthma Paternal Aunt    Pancreatic cancer Other    Breast cancer Maternal Aunt 59   Allergic rhinitis Maternal Aunt    Pancreatic cancer Maternal Aunt    Allergic rhinitis Maternal Aunt    Allergic rhinitis Maternal Aunt    Asthma Maternal Aunt    Melanoma Paternal Aunt    Allergic rhinitis Paternal Aunt    Asthma Paternal Aunt    Melanoma Other     Social History:   Social History   Socioeconomic History   Marital status: Legally Separated    Spouse name: Not on file   Number of children: Not on file   Years of education: Not on file   Highest education level: Not on file  Occupational History   Occupation: Consulting civil engineer   Occupation: POLICE OFFICER    Employer: [employer]  Tobacco Use   Smoking status: Former    Types: Cigarettes   Smokeless tobacco: Never  Vaping Use   Vaping status: Never Used  Substance and Sexual Activity   Alcohol use: Yes    Alcohol/week: 2.0 standard drinks of alcohol    Types: 2 Standard drinks or equivalent per week    Comment: rare   Drug use: Never   Sexual activity: Yes    Partners: Female    Comment: Same sex marriage  Other Topics Concern   Not on file  Social History Narrative   She exercises at the gym and does kickboxing and soccer.   Social Drivers of Health   Financial Resource Strain: Patient Declined (07/11/2023)   Received from [facility]   Overall Financial Resource Strain (CARDIA)    Difficulty of Paying Living Expenses: Patient declined  Food Insecurity: Patient Declined (07/11/2023)   Received from [facility]   Hunger Vital Sign    Worried About Running Out of Food in the Last Year: Patient declined    Ran Out of Food in the Last Year: Patient declined  Transportation Needs: Patient Declined (07/11/2023)   Received from [facility] - Transportation    Lack of Transportation (Medical): Patient  declined    Lack of Transportation (Non-Medical): Patient declined  Physical Activity: Not on file  Stress: Not on file  Social Connections: Unknown (10/26/2021)   Received from [facility]   Social Network    Social Network: Not on file     Allergies:   Allergies  Allergen Reactions   Cayenne Anaphylaxis   Azithromycin Itching and Rash    Also burning.   Also burning. Also burning.   Doxycycline Other (See Comments)  and Swelling    Esophagus issues. "felt like razor blades" Throat swelling  Other reaction(s): Other (See Comments) Throat swelling Esophagus issues. "felt like razor blades" Esophagus issues. "felt like razor blades" Throat swelling   Doxycycline Calcium Other (See Comments)   Buspar [Buspirone] Anxiety    Rx caused muscle spasms    Metabolic Disorder Labs: No results found for: "HGBA1C", "MPG" No results found for: "PROLACTIN" Lab Results  Component Value Date   CHOL 203 (H) 07/13/2023   TRIG 62 07/13/2023   HDL 70 07/13/2023   LDLCALC 122 (H) 07/13/2023   Lab Results  Component Value Date   TSH 1.33 10/07/2021    Therapeutic Level Labs: No results found for: "LITHIUM" No results found for: "CBMZ" No results found for: "VALPROATE"  Current Medications: Current Outpatient Medications  Medication Sig Dispense Refill   amphetamine-dextroamphetamine (ADDERALL) 10 MG tablet Take 1 tablet (10 mg total) by mouth daily. 30 tablet 0   albuterol (VENTOLIN HFA) 108 (90 Base) MCG/ACT inhaler Inhale 2 puffs into the lungs every 6 (six) hours as needed for wheezing or shortness of breath. 8 g 1   pantoprazole (PROTONIX) 40 MG tablet TAKE 1 TABLET BY MOUTH EVERY DAY 90 tablet 0   sertraline (ZOLOFT) 100 MG tablet TAKE 1 TABLET BY MOUTH EVERY DAY 90 tablet 0   sertraline (ZOLOFT) 25 MG tablet Take 1 tablet (25 mg total) by mouth daily. 90 tablet 0   traZODone (DESYREL) 50 MG tablet Take 1 tablet (50 mg total) by mouth at bedtime. 90 tablet  0   No current facility-administered medications for this visit.     Psychiatric Specialty Exam: Review of Systems  Cardiovascular:  Negative for chest pain.  Neurological:  Negative for tremors.  Psychiatric/Behavioral:  Negative for agitation, hallucinations and self-injury.     There were no vitals taken for this visit.There is no height or weight on file to calculate BMI.  General Appearance: Neat  Eye Contact:  Good  Speech:  Clear and Coherent  Volume:  Normal  Mood:  somewhat stressed  Affect:  Congruent  Thought Process:  Goal Directed  Orientation:  Full (Time, Place, and Person)  Thought Content:  Rumination  Suicidal Thoughts:  No  Homicidal Thoughts:  No  Memory:  Immediate;   Fair  Judgement:  Fair  Insight:  Fair  Psychomotor Activity:  Normal  Concentration:  Concentration: Fair  Recall:  Fair  Fund of Knowledge:Good  Language: Good  Akathisia:  No  Handed:    AIMS (if indicated):  not done  Assets:  Communication Skills Desire for Improvement Housing  ADL's:  Intact  Cognition: WNL  Sleep:  variable   Screenings: GAD-7    Flowsheet Row Office Visit from 10/23/2019 in [facility] at [facility]  Total GAD-7 Score 11      PHQ2-9    Flowsheet Row Office Visit from 06/07/2023 in [facility] at [facility] Office Visit from 09/28/2021 in [facility] at [facility] Office Visit from 10/23/2019 in [facility] at [facility] Total Score 0 1 4  PHQ-9 Total Score -- -- 20      Flowsheet Row ED from 02/14/2023 in [facility] at [facility] Video Visit from 04/05/2022 in [facility] at [facility] Video Visit from 01/08/2022 in [facility] at [facility]  C-SSRS RISK CATEGORY No Risk No Risk No Risk         Assessment and Plan: as follows  Prior documentation reviewed   ADHD;  manageable understands the risk of meds, will lower it to 10mg  take drug holidays and being eval for seizures  Generalized anxiety disorder:flucutated, understand to work down on work hours if can, is in therapy . She does not feel need to increase zoloft, will continue 154m.   Insomnia; can be irregular, trazadone helps, will continue PTSD; baseline, continue zoloft and woking in therapy, going thru divorce  Fu 2 m. Or earlier if needed   Collaboration of Care: Medication Management AEB med review and referral papers reviewed,   Patient/Guardian was advised Release of Information must be obtained prior to any record release in order to collaborate their care with an outside provider. Patient/Guardian was advised if they have not already done so to contact the registration department to sign all necessary forms in order for [place] to release information regarding their care.   Consent: Patient/Guardian gives verbal consent for treatment and assignment of benefits for services provided during this visit. Patient/Guardian expressed understanding and agreed to proceed.   Thresa Ross, MD 2/3/20254:40 PM

## 2023-08-09 ENCOUNTER — Other Ambulatory Visit (HOSPITAL_COMMUNITY): Payer: Self-pay | Admitting: *Deleted

## 2023-08-09 ENCOUNTER — Telehealth (HOSPITAL_COMMUNITY): Payer: Self-pay | Admitting: *Deleted

## 2023-08-09 DIAGNOSIS — F4310 Post-traumatic stress disorder, unspecified: Secondary | ICD-10-CM

## 2023-08-09 MED ORDER — TRAZODONE HCL 50 MG PO TABS
50.0000 mg | ORAL_TABLET | Freq: Every day | ORAL | 0 refills | Status: DC
Start: 2023-08-09 — End: 2024-05-09

## 2023-08-09 NOTE — Telephone Encounter (Signed)
sent, co sign  Trazodone 50 mg tablet

## 2023-08-09 NOTE — Telephone Encounter (Signed)
Refill Request [id number] - [place], [place] - [address]   traZODone (DESYREL) 50 MG tablet   Next Appt  10/03/23 Last Appt  08/01/23

## 2023-08-10 ENCOUNTER — Telehealth: Payer: Managed Care, Other (non HMO) | Admitting: Physician Assistant

## 2023-08-10 DIAGNOSIS — R6889 Other general symptoms and signs: Secondary | ICD-10-CM

## 2023-08-10 DIAGNOSIS — J4520 Mild intermittent asthma, uncomplicated: Secondary | ICD-10-CM | POA: Diagnosis not present

## 2023-08-10 MED ORDER — PSEUDOEPH-BROMPHEN-DM 30-2-10 MG/5ML PO SYRP
5.0000 mL | ORAL_SOLUTION | Freq: Four times a day (QID) | ORAL | 0 refills | Status: DC | PRN
Start: 2023-08-10 — End: 2024-01-19

## 2023-08-10 MED ORDER — PREDNISONE 20 MG PO TABS
40.0000 mg | ORAL_TABLET | Freq: Every day | ORAL | 0 refills | Status: DC
Start: 2023-08-10 — End: 2024-01-19

## 2023-08-10 MED ORDER — OSELTAMIVIR PHOSPHATE 75 MG PO CAPS: 75.0000 mg | ORAL_CAPSULE | Freq: Two times a day (BID) | ORAL | 0 refills | Status: DC

## 2023-08-10 NOTE — Patient Instructions (Signed)
Ardelle Balls, thank you for joining Margaretann Loveless, PA-C for today's virtual visit.  While this provider is not your primary care provider (PCP), if your PCP is located in our provider database this encounter information will be shared with them immediately following your visit.   A [place] MyChart account gives you access to today's visit and all your visits, tests, and labs performed at [facility] " click here if you don't have a [place] MyChart account or go to [email or web address]  Consent: (Patient) Adalind Weitz provided verbal consent for this virtual visit at the beginning of the encounter.  Current Medications:  Current Outpatient Medications:    brompheniramine-pseudoephedrine-DM 30-2-10 MG/5ML syrup, Take 5 mLs by mouth 4 (four) times daily as needed., Disp: 120 mL, Rfl: 0   oseltamivir (TAMIFLU) 75 MG capsule, Take 1 capsule (75 mg total) by mouth 2 (two) times daily., Disp: 10 capsule, Rfl: 0   predniSONE (DELTASONE) 20 MG tablet, Take 2 tablets (40 mg total) by mouth daily with breakfast., Disp: 10 tablet, Rfl: 0   albuterol (VENTOLIN HFA) 108 (90 Base) MCG/ACT inhaler, Inhale 2 puffs into the lungs every 6 (six) hours as needed for wheezing or shortness of breath., Disp: 8 g, Rfl: 1   amphetamine-dextroamphetamine (ADDERALL) 10 MG tablet, Take 1 tablet (10 mg total) by mouth daily., Disp: 30 tablet, Rfl: 0   pantoprazole (PROTONIX) 40 MG tablet, TAKE 1 TABLET BY MOUTH EVERY DAY, Disp: 90 tablet, Rfl: 0   sertraline (ZOLOFT) 100 MG tablet, TAKE 1 TABLET BY MOUTH EVERY DAY, Disp: 90 tablet, Rfl: 0   sertraline (ZOLOFT) 25 MG tablet, Take 1 tablet (25 mg total) by mouth daily., Disp: 90 tablet, Rfl: 0   traZODone (DESYREL) 50 MG tablet, Take 1 tablet (50 mg total) by mouth at bedtime., Disp: 90 tablet, Rfl: 0   Medications ordered in this encounter:  Meds ordered this encounter  Medications   oseltamivir (TAMIFLU) 75 MG capsule    Sig: Take 1 capsule  (75 mg total) by mouth 2 (two) times daily.    Dispense:  10 capsule    Refill:  0    Supervising Provider:   Merrilee Jansky [[id number]]   brompheniramine-pseudoephedrine-DM 30-2-10 MG/5ML syrup    Sig: Take 5 mLs by mouth 4 (four) times daily as needed.    Dispense:  120 mL    Refill:  0    Supervising Provider:   Merrilee Jansky [[id number]]   predniSONE (DELTASONE) 20 MG tablet    Sig: Take 2 tablets (40 mg total) by mouth daily with breakfast.    Dispense:  10 tablet    Refill:  0    Supervising Provider:   Merrilee Jansky [[id number]]     *If you need refills on other medications prior to your next appointment, please contact your pharmacy*  Follow-Up: Call back or seek an in-person evaluation if the symptoms worsen or if the condition fails to improve as anticipated.  [facility] [phone]  Other Instructions Influenza, Adult Influenza is also called the flu. It's an infection that affects your respiratory tract. This includes your nose, throat, windpipe, and lungs. The flu is contagious. This means it spreads easily from person to person. It causes symptoms that are like a cold. It can also cause a high fever and body aches. What are the causes? The flu is caused by the influenza virus. You can get it by: Breathing in droplets that are in the  air after an infected person coughs or sneezes. Touching something that has the virus on it and then touching your mouth, nose, or eyes. What increases the risk? You may be more likely to get the flu if: You don't wash your hands often. You're near a lot of people during cold and flu season. You touch your mouth, eyes, or nose without washing your hands first. You don't get a flu shot each year. You may also be more at risk for the flu and serious problems, such as a lung infection called pneumonia, if: You're older than 65. You're pregnant. Your immune system is weak. Your immune system is your body's defense  system. You have a long-term, or chronic, condition, such as: Heart, kidney, or lung disease. Diabetes. A liver disorder. Asthma. You're very overweight. You have anemia. This is when you don't have enough red blood cells in your body. What are the signs or symptoms? Flu symptoms often start all of a sudden. They may last 4-14 days and include: Fever and chills. Headaches, body aches, or muscle aches. Sore throat. Cough. Runny or stuffy nose. Discomfort in your chest. Not wanting to eat as much as normal. Feeling weak or tired. Feeling dizzy. Nausea or vomiting. How is this diagnosed? The flu may be diagnosed based on your symptoms and medical history. You may also have a physical exam. A swab may be taken from your nose or throat and tested for the virus. How is this treated? If the flu is found early, you can be treated with antiviral medicine. This may be given to you by mouth or through an IV. It can help you feel less sick and get better faster. Taking care of yourself at home can also help your symptoms get better. Your health care provider may tell you to: Take over-the-counter medicines. Drink lots of fluids. The flu often goes away on its own. If you have very bad symptoms or problems caused by the flu, you may need to be treated in a hospital. Follow these instructions at home: Activity Rest as needed. Get lots of sleep. Stay home from work or school as told by your provider. Leave home only to go see your provider. Do not leave home for other reasons until you don't have a fever for 24 hours without taking medicine. Eating and drinking Take an oral rehydration solution (ORS). This is a drink that is sold at pharmacies and stores. Drink enough fluid to keep your pee pale yellow. Try to drink small amounts of clear fluids. These include water, ice chips, fruit juice mixed with water, and low-calorie sports drinks. Try to eat bland foods that are easy to digest. These  include bananas, applesauce, rice, lean meats, toast, and crackers. Avoid drinks that have a lot of sugar or caffeine in them. These include energy drinks, regular sports drinks, and soda. Do not drink alcohol. Do not eat spicy or fatty foods. General instructions     Take your medicines only as told by your provider. Use a cool mist humidifier to add moisture to the air in your home. This can make it easier for you to breathe. You should also clean the humidifier every day. To do so: Empty the water. Pour clean water in. Cover your mouth and nose when you cough or sneeze. Wash your hands with soap and water often and for at least 20 seconds. It's extra important to do so after you cough or sneeze. If you can't use soap and water,  use hand sanitizer. How is this prevented?  Get a flu shot every year. Ask your provider when you should get your flu shot. Stay away from people who are sick during fall and winter. Fall and winter are cold and flu season. Contact a health care provider if: You get new symptoms. You have chest pain. You have watery poop, also called diarrhea. You have a fever. Your cough gets worse. You start to have more mucus. You feel like you may vomit, or you vomit. Get help right away if: You become short of breath or have trouble breathing. Your skin or nails turn blue. You have very bad pain or stiffness in your neck. You get a sudden headache or pain in your face or ear. You vomit each time you eat or drink. These symptoms may be an emergency. Call 911 right away. Do not wait to see if the symptoms will go away. Do not drive yourself to the hospital. This information is not intended to replace advice given to you by your health care provider. Make sure you discuss any questions you have with your health care provider. Document Revised: 03/17/2023 Document Reviewed: 07/22/2022 Elsevier Patient Education  2024 Elsevier Inc.   If you have been instructed to  have an in-person evaluation today at a local Urgent Care facility, please use the link below. It will take you to a list of all of our available [place] [facility], including address, phone number and hours of operation. Please do not delay care.  [facility]  If you or a family member do not have a primary care provider, use the link below to schedule a visit and establish care. When you choose a [place] primary care physician or advanced practice provider, you gain a long-term partner in health. Find a Primary Care Provider  Learn more about [place]'s in-office and virtual care options: [facility] - Get Care Now

## 2023-08-10 NOTE — Progress Notes (Signed)
 Virtual Visit Consent   Barbara Sellers, you are scheduled for a virtual visit with a [facility] provider today. Just as with appointments in the office, your consent must be obtained to participate. Your consent will be active for this visit and any virtual visit you may have with one of our providers in the next 365 days. If you have a MyChart account, a copy of this consent can be sent to you electronically.  As this is a virtual visit, video technology does not allow for your provider to perform a traditional examination. This may limit your provider's ability to fully assess your condition. If your provider identifies any concerns that need to be evaluated in person or the need to arrange testing (such as labs, EKG, etc.), we will make arrangements to do so. Although advances in technology are sophisticated, we cannot ensure that it will always work on either your end or our end. If the connection with a video visit is poor, the visit may have to be switched to a telephone visit. With either a video or telephone visit, we are not always able to ensure that we have a secure connection.  By engaging in this virtual visit, you consent to the provision of healthcare and authorize for your insurance to be billed (if applicable) for the services provided during this visit. Depending on your insurance coverage, you may receive a charge related to this service.  I need to obtain your verbal consent now. Are you willing to proceed with your visit today? [name] has provided verbal consent on 08/10/2023 for a virtual visit (video or telephone). Margaretann Loveless, PA-C  Date: 08/10/2023 5:38 PM   Virtual Visit via Video Note   I, Margaretann Loveless, connected with  [name]  ([id number], 1990/08/31) on 08/10/23 at  5:30 PM EST by a video-enabled telemedicine application and verified that I am speaking with the correct person using two identifiers.  Location: Patient: Virtual Visit Location Patient:  Home Provider: Virtual Visit Location Provider: Home Office   I discussed the limitations of evaluation and management by telemedicine and the availability of in person appointments. The patient expressed understanding and agreed to proceed.    History of Present Illness: Barbara Sellers is a 33 y.o. who identifies as a female who was assigned female at birth, and is being seen today for flu-like symptoms.  HPI: URI  This is a new problem. The current episode started yesterday. The problem has been gradually worsening. Maximum temperature: subjective fever. Associated symptoms include congestion, coughing (dry; deep bronchial barking cough), diarrhea, ear pain, headaches, nausea, a plugged ear sensation, rhinorrhea, sinus pain, a sore throat and swollen glands. Pertinent negatives include no chest pain, vomiting or wheezing. Associated symptoms comments: Fatigue, dizziness, woke up drenched in sweat, body aches. She has tried acetaminophen, NSAIDs and increased fluids (dayquil) for the symptoms. The treatment provided no relief.  At home Covid 19 testing is negative Has been around flu positive at work, multiple cases  Problems:  Patient Active Problem List   Diagnosis Date Noted   Well adult exam 07/13/2023   Atypical pneumonia 06/07/2023   Blood in stool 06/06/2022   Postprandial nausea 06/06/2022   Right lower quadrant abdominal pain 12/02/2021   Diverticulosis 12/02/2021   History of diverticulitis 12/02/2021   Abdominal guarding 12/02/2021   ADHD 10/07/2021   Abnormal weight gain 10/07/2021   Mild intermittent asthma without complication 10/01/2021   Anxiety 09/28/2021   Migraine 09/28/2021   Hemifacial  spasm of left side of face 01/15/2021   Acute abdominal pain in left lower quadrant 11/07/2020   Concussion 10/24/2020   Acute pain of right shoulder 09/26/2020   Seasonal and perennial allergic rhinoconjunctivitis 03/04/2020   Genetic testing 12/10/2019   Asthma 11/29/2019    Adverse food reaction 11/29/2019   Drug reaction 11/29/2019   Family history of melanoma    Family history of breast cancer    Family history of pancreatic cancer    Family history of prostate cancer    Ocular migraine 10/23/2019   Cubital tunnel syndrome, left 07/19/2018   S/P hardware removal 10/09/2015   Fracture of metacarpal base of left hand, closed 11/15/2014   Closed fracture of distal end of left radius 11/11/2014    Allergies:  Allergies  Allergen Reactions   Cayenne Anaphylaxis   Azithromycin Itching and Rash    Also burning.   Also burning. Also burning.   Doxycycline Other (See Comments) and Swelling    Esophagus issues. "felt like razor blades" Throat swelling  Other reaction(s): Other (See Comments) Throat swelling Esophagus issues. "felt like razor blades" Esophagus issues. "felt like razor blades" Throat swelling   Doxycycline Calcium Other (See Comments)   Buspar [Buspirone] Anxiety    Rx caused muscle spasms   Medications:  Current Outpatient Medications:    albuterol (VENTOLIN HFA) 108 (90 Base) MCG/ACT inhaler, Inhale 2 puffs into the lungs every 6 (six) hours as needed for wheezing or shortness of breath., Disp: 8 g, Rfl: 1   amphetamine-dextroamphetamine (ADDERALL) 10 MG tablet, Take 1 tablet (10 mg total) by mouth daily., Disp: 30 tablet, Rfl: 0   brompheniramine-pseudoephedrine-DM 30-2-10 MG/5ML syrup, Take 5 mLs by mouth 4 (four) times daily as needed., Disp: 120 mL, Rfl: 0   oseltamivir (TAMIFLU) 75 MG capsule, Take 1 capsule (75 mg total) by mouth 2 (two) times daily., Disp: 10 capsule, Rfl: 0   pantoprazole (PROTONIX) 40 MG tablet, TAKE 1 TABLET BY MOUTH EVERY DAY, Disp: 90 tablet, Rfl: 0   predniSONE (DELTASONE) 20 MG tablet, Take 2 tablets (40 mg total) by mouth daily with breakfast., Disp: 10 tablet, Rfl: 0   sertraline (ZOLOFT) 100 MG tablet, TAKE 1 TABLET BY MOUTH EVERY DAY, Disp: 90 tablet, Rfl: 0   sertraline (ZOLOFT) 25 MG tablet, Take  1 tablet (25 mg total) by mouth daily., Disp: 90 tablet, Rfl: 0   traZODone (DESYREL) 50 MG tablet, Take 1 tablet (50 mg total) by mouth at bedtime., Disp: 90 tablet, Rfl: 0  Observations/Objective: Patient is well-developed, well-nourished in no acute distress.  Resting comfortably at home.  Head is normocephalic, atraumatic.  No labored breathing.  Speech is clear and coherent with logical content.  Patient is alert and oriented at baseline.  Deep bronchial cough heard   Assessment and Plan: 1. Flu-like symptoms (Primary) - oseltamivir (TAMIFLU) 75 MG capsule; Take 1 capsule (75 mg total) by mouth 2 (two) times daily.  Dispense: 10 capsule; Refill: 0 - brompheniramine-pseudoephedrine-DM 30-2-10 MG/5ML syrup; Take 5 mLs by mouth 4 (four) times daily as needed.  Dispense: 120 mL; Refill: 0 - predniSONE (DELTASONE) 20 MG tablet; Take 2 tablets (40 mg total) by mouth daily with breakfast.  Dispense: 10 tablet; Refill: 0  2. Mild intermittent asthma without complication  - Suspect influenza due to symptoms and positive exposure, negative Covid testing - Tamiflu prescribed - Bromfed DM for cough - Prednisone to have on hand if asthma worsens - Continue OTC medication of choice for  symptomatic management - Push fluids - Rest - Seek in person evaluation if symptoms worsen or fail to improve   Follow Up Instructions: I discussed the assessment and treatment plan with the patient. The patient was provided an opportunity to ask questions and all were answered. The patient agreed with the plan and demonstrated an understanding of the instructions.  A copy of instructions were sent to the patient via MyChart unless otherwise noted below.    The patient was advised to call back or seek an in-person evaluation if the symptoms worsen or if the condition fails to improve as anticipated.    Margaretann Loveless, PA-C

## 2023-08-25 ENCOUNTER — Ambulatory Visit (INDEPENDENT_AMBULATORY_CARE_PROVIDER_SITE_OTHER): Payer: Managed Care, Other (non HMO)

## 2023-08-25 DIAGNOSIS — J309 Allergic rhinitis, unspecified: Secondary | ICD-10-CM

## 2023-09-01 ENCOUNTER — Ambulatory Visit (INDEPENDENT_AMBULATORY_CARE_PROVIDER_SITE_OTHER)

## 2023-09-01 DIAGNOSIS — J309 Allergic rhinitis, unspecified: Secondary | ICD-10-CM

## 2023-09-12 ENCOUNTER — Ambulatory Visit: Payer: Self-pay

## 2023-09-13 ENCOUNTER — Ambulatory Visit (INDEPENDENT_AMBULATORY_CARE_PROVIDER_SITE_OTHER)

## 2023-09-13 DIAGNOSIS — J309 Allergic rhinitis, unspecified: Secondary | ICD-10-CM | POA: Diagnosis not present

## 2023-09-14 ENCOUNTER — Telehealth (HOSPITAL_COMMUNITY): Payer: Self-pay | Admitting: *Deleted

## 2023-09-14 MED ORDER — SERTRALINE HCL 100 MG PO TABS: 100.0000 mg | ORAL_TABLET | Freq: Every day | ORAL | 0 refills | Status: DC

## 2023-09-14 MED ORDER — SERTRALINE HCL 25 MG PO TABS: 25.0000 mg | ORAL_TABLET | Freq: Every day | ORAL | 0 refills | Status: DC

## 2023-09-14 NOTE — Addendum Note (Signed)
 Addended by: Thresa Ross on: 09/14/2023 08:50 AM   Modules accepted: Orders

## 2023-09-14 NOTE — Telephone Encounter (Signed)
 Rx REFILL [facility]QUEST [id number] [place], [place] - [address]   sertraline (ZOLOFT) 25 MG tablet             && sertraline (ZOLOFT) 100 MG tablet  90 tablet  LAST FILL DATE 06/16/23   Next Appt  10/03/23 Last Appt  08/01/23

## 2023-10-03 ENCOUNTER — Telehealth (INDEPENDENT_AMBULATORY_CARE_PROVIDER_SITE_OTHER): Payer: 59 | Admitting: Psychiatry

## 2023-10-03 ENCOUNTER — Encounter (HOSPITAL_COMMUNITY): Payer: Self-pay | Admitting: Psychiatry

## 2023-10-03 DIAGNOSIS — F5102 Adjustment insomnia: Secondary | ICD-10-CM

## 2023-10-03 DIAGNOSIS — F4310 Post-traumatic stress disorder, unspecified: Secondary | ICD-10-CM

## 2023-10-03 DIAGNOSIS — F900 Attention-deficit hyperactivity disorder, predominantly inattentive type: Secondary | ICD-10-CM

## 2023-10-03 DIAGNOSIS — F411 Generalized anxiety disorder: Secondary | ICD-10-CM | POA: Diagnosis not present

## 2023-10-03 MED ORDER — AMPHETAMINE-DEXTROAMPHETAMINE 15 MG PO TABS: 15.0000 mg | ORAL_TABLET | Freq: Every day | ORAL | 0 refills | Status: DC

## 2023-10-03 NOTE — Progress Notes (Signed)
 [facility] Follow up visit  Patient Identification: Barbara Sellers MRN:  [id number] Date of Evaluation:  10/03/2023 Referral Source: primary care Chief Complaint:   Follow up adhd, anxiety   Visit Diagnosis:    ICD-10-CM   1. PTSD (post-traumatic stress disorder)  F43.10     2. GAD (generalized anxiety disorder)  F41.1     3. Attention deficit hyperactivity disorder (ADHD), predominantly inattentive type  F90.0     4. Adjustment insomnia  F51.02     Virtual Visit via Video Note  I connected with [name] on 10/03/23 at  3:30 PM EDT by a video enabled telemedicine application and verified that I am speaking with the correct person using two identifiers.  Location: Patient: home  Provider: home office   I discussed the limitations of evaluation and management by telemedicine and the availability of in person appointments. The patient expressed understanding and agreed to proceed.        I discussed the assessment and treatment plan with the patient. The patient was provided an opportunity to ask questions and all were answered. The patient agreed with the plan and demonstrated an understanding of the instructions.   The patient was advised to call back or seek an in-person evaluation if the symptoms worsen or if the condition fails to improve as anticipated.  I provided 18 minutes of non-face-to-face time during this encounter.  History of Present Illness: Patient is a 33 years old married Caucasian female has worked in the police department  She has noticed increased concern with in attention and anxiety, stressed out since adderall lowered Wants to go back to 15mg  Doing 2 jobs and stress related with finances but focusing is a concern  Has cut down coffee intake   Patient does not no psychotic symptoms does not also endorse manic symptoms  Aggravating factors: traumatic brain injury .  Divorce,  finances Modifying factors; dogs  Duration since age 54  Severity ; gets  stressed  Past Psychiatric History: PTSD, Adhd  Previous Psychotropic Medications: Yes   Substance Abuse History in the last 12 months:  No.  Consequences of Substance Abuse: Drinks one a week, denies concerns  Past Medical History:  Past Medical History:  Diagnosis Date   ADHD (attention deficit hyperactivity disorder)    Asthma    Depression    Eczema    Family history of breast cancer    Family history of melanoma    Family history of pancreatic cancer    Family history of prostate cancer    Migraines    Ocular migraine 10/23/2019   PTSD (post-traumatic stress disorder)    Recurrent upper respiratory infection (URI)    Rheumatoid arthritis (HCC)    Seasonal allergies    Swine flu 2009   Urticaria    Vertigo     Past Surgical History:  Procedure Laterality Date   APPENDECTOMY  1996   lasik Bilateral    ORIF DISTAL RADIUS FRACTURE     WISDOM TOOTH EXTRACTION        Family History:  Family History  Problem Relation Age of Onset   Macular degeneration Mother    Urticaria Mother    Colon polyps Father    Diabetes Father    Diverticulitis Father    Cancer Father    Cancer - Other Father        Adenocarcinoma   Allergic rhinitis Father    Asthma Father    Eczema Father    Allergic rhinitis Brother  Arrhythmia Brother        half-brother   Allergic rhinitis Brother    Diabetes Maternal Aunt    Allergic rhinitis Maternal Aunt    Cancer Maternal Grandmother        SKIN   Cancer Maternal Grandfather        SKIN   Hypertension Paternal Grandmother    Skin cancer Paternal Grandmother    Allergic rhinitis Paternal Grandmother    Asthma Paternal Grandmother    Hypertension Paternal Grandfather    Cancer Paternal Grandfather        melanoma, kidney, bladder, prostate, lung, liver cancer   Skin cancer Paternal Grandfather    Melanoma Paternal Aunt    Allergic rhinitis Paternal Aunt    Asthma Paternal Aunt    Pancreatic cancer Other    Breast cancer  Maternal Aunt 59   Allergic rhinitis Maternal Aunt    Pancreatic cancer Maternal Aunt    Allergic rhinitis Maternal Aunt    Allergic rhinitis Maternal Aunt    Asthma Maternal Aunt    Melanoma Paternal Aunt    Allergic rhinitis Paternal Aunt    Asthma Paternal Aunt    Melanoma Other     Social History:   Social History   Socioeconomic History   Marital status: Legally Separated    Spouse name: Not on file   Number of children: Not on file   Years of education: Not on file   Highest education level: Not on file  Occupational History   Occupation: Consulting civil engineer   Occupation: POLICE OFFICER    Employer: [employer]  Tobacco Use   Smoking status: Former    Types: Cigarettes   Smokeless tobacco: Never  Vaping Use   Vaping status: Never Used  Substance and Sexual Activity   Alcohol use: Yes    Alcohol/week: 2.0 standard drinks of alcohol    Types: 2 Standard drinks or equivalent per week    Comment: rare   Drug use: Never   Sexual activity: Yes    Partners: Female    Comment: Same sex marriage  Other Topics Concern   Not on file  Social History Narrative   She exercises at the gym and does kickboxing and soccer.   Social Drivers of Health   Financial Resource Strain: Patient Declined (07/11/2023)   Received from [facility]   Overall Financial Resource Strain (CARDIA)    Difficulty of Paying Living Expenses: Patient declined  Food Insecurity: Patient Declined (07/11/2023)   Received from [facility]   Hunger Vital Sign    Worried About Running Out of Food in the Last Year: Patient declined    Ran Out of Food in the Last Year: Patient declined  Transportation Needs: Patient Declined (07/11/2023)   Received from [facility] - Transportation    Lack of Transportation (Medical): Patient declined    Lack of Transportation (Non-Medical): Patient declined  Physical Activity: Not on file  Stress: Not on file  Social Connections: Unknown (10/26/2021)   Received  from [facility]   Social Network    Social Network: Not on file     Allergies:   Allergies  Allergen Reactions   Cayenne Anaphylaxis   Azithromycin Itching and Rash    Also burning.   Also burning. Also burning.   Doxycycline Other (See Comments) and Swelling    Esophagus issues. "felt like razor blades" Throat swelling  Other reaction(s): Other (See Comments) Throat swelling Esophagus issues. "felt like  razor blades" Esophagus issues. "felt like razor blades" Throat swelling   Doxycycline Calcium Other (See Comments)   Buspar [Buspirone] Anxiety    Rx caused muscle spasms    Metabolic Disorder Labs: No results found for: "HGBA1C", "MPG" No results found for: "PROLACTIN" Lab Results  Component Value Date   CHOL 203 (H) 07/13/2023   TRIG 62 07/13/2023   HDL 70 07/13/2023   LDLCALC 122 (H) 07/13/2023   Lab Results  Component Value Date   TSH 1.33 10/07/2021    Therapeutic Level Labs: No results found for: "LITHIUM" No results found for: "CBMZ" No results found for: "VALPROATE"  Current Medications: Current Outpatient Medications  Medication Sig Dispense Refill   albuterol (VENTOLIN HFA) 108 (90 Base) MCG/ACT inhaler Inhale 2 puffs into the lungs every 6 (six) hours as needed for wheezing or shortness of breath. 8 g 1   amphetamine-dextroamphetamine (ADDERALL) 15 MG tablet Take 1 tablet by mouth daily. 30 tablet 0   brompheniramine-pseudoephedrine-DM 30-2-10 MG/5ML syrup Take 5 mLs by mouth 4 (four) times daily as needed. 120 mL 0   oseltamivir (TAMIFLU) 75 MG capsule Take 1 capsule (75 mg total) by mouth 2 (two) times daily. 10 capsule 0   pantoprazole (PROTONIX) 40 MG tablet TAKE 1 TABLET BY MOUTH EVERY DAY 90 tablet 0   predniSONE (DELTASONE) 20 MG tablet Take 2 tablets (40 mg total) by mouth daily with breakfast. 10 tablet 0   sertraline (ZOLOFT) 100 MG tablet Take 1 tablet (100 mg total) by mouth daily. 90 tablet 0   sertraline (ZOLOFT)  25 MG tablet Take 1 tablet (25 mg total) by mouth daily. 90 tablet 0   traZODone (DESYREL) 50 MG tablet Take 1 tablet (50 mg total) by mouth at bedtime. 90 tablet 0   No current facility-administered medications for this visit.     Psychiatric Specialty Exam: Review of Systems  Neurological:  Negative for tremors.  Psychiatric/Behavioral:  Negative for agitation, hallucinations and self-injury.     There were no vitals taken for this visit.There is no height or weight on file to calculate BMI.  General Appearance: Neat  Eye Contact:  Good  Speech:  Clear and Coherent  Volume:  Normal  Mood:  somewhat stressed  Affect:  Congruent  Thought Process:  Goal Directed  Orientation:  Full (Time, Place, and Person)  Thought Content:  Rumination  Suicidal Thoughts:  No  Homicidal Thoughts:  No  Memory:  Immediate;   Fair  Judgement:  Fair  Insight:  Fair  Psychomotor Activity:  Normal  Concentration:  Concentration: Fair  Recall:  Fair  Fund of Knowledge:Good  Language: Good  Akathisia:  No  Handed:    AIMS (if indicated):  not done  Assets:  Communication Skills Desire for Improvement Housing  ADL's:  Intact  Cognition: WNL  Sleep:  variable   Screenings: GAD-7    Flowsheet Row Office Visit from 10/23/2019 in [facility] at [facility]  Total GAD-7 Score 11      PHQ2-9    Flowsheet Row Office Visit from 06/07/2023 in [facility] Primary [facility] & [facility] Medicine at [facility] Office Visit from 09/28/2021 in [facility] at [facility] Office Visit from 10/23/2019 in [facility] Primary Care & Sports Medicine at [facility] Total Score 0 1 4  PHQ-9 Total Score -- -- 20      Flowsheet Row Video Visit from 08/01/2023 in [facility]  Outpatient Behavioral Health at [facility] ED from 02/14/2023 in [facility] Ur[facility] Care at [facility] Video Visit from  04/05/2022 in [facility] at [facility]  C-SSRS RISK CATEGORY No Risk No Risk No Risk        Assessment and Plan: as follows   Prior documentation reviewed   ADHD; getting distracted, wants to increase adderall, understands the risk   Wil increase to 15mg  discussed to cut down work hours but she cannot  Generalized anxiety disorder: stressed but does not want to increase zoloft Understands she is is doing 2 jobs and should balance that part, consider therapy  Insomnia; manageable with trazadone, continue sleep hygiene  PTSD; baseline, continue zoloft and woking in therapy, going thru divorce  Fu 2 - 3 m. Or earlier if needed Reviewed meds, questions and concerns addressed.    Collaboration of Care: Medication Management AEB med review and referral papers reviewed,   Patient/Guardian was advised Release of Information must be obtained prior to any record release in order to collaborate their care with an outside provider. Patient/Guardian was advised if they have not already done so to contact the registration department to sign all necessary forms in order for [place] to release information regarding their care.   Consent: Patient/Guardian gives verbal consent for treatment and assignment of benefits for services provided during this visit. Patient/Guardian expressed understanding and agreed to proceed.   Thresa Ross, MD 4/7/20253:39 PM

## 2023-10-07 ENCOUNTER — Other Ambulatory Visit: Payer: Self-pay | Admitting: Family Medicine

## 2023-10-18 ENCOUNTER — Ambulatory Visit (INDEPENDENT_AMBULATORY_CARE_PROVIDER_SITE_OTHER)

## 2023-10-18 DIAGNOSIS — J309 Allergic rhinitis, unspecified: Secondary | ICD-10-CM | POA: Diagnosis not present

## 2023-11-17 ENCOUNTER — Ambulatory Visit (INDEPENDENT_AMBULATORY_CARE_PROVIDER_SITE_OTHER)

## 2023-11-17 ENCOUNTER — Telehealth (HOSPITAL_COMMUNITY): Payer: Self-pay | Admitting: *Deleted

## 2023-11-17 DIAGNOSIS — J309 Allergic rhinitis, unspecified: Secondary | ICD-10-CM | POA: Diagnosis not present

## 2023-11-17 MED ORDER — AMPHETAMINE-DEXTROAMPHETAMINE 15 MG PO TABS: 15.0000 mg | ORAL_TABLET | Freq: Every day | ORAL | 0 refills | Status: DC

## 2023-11-17 MED ORDER — SERTRALINE HCL 25 MG PO TABS: 25.0000 mg | ORAL_TABLET | Freq: Every day | ORAL | 0 refills | Status: DC

## 2023-11-17 NOTE — Addendum Note (Signed)
 Addended by: Halynn Reitano on: 11/17/2023 09:01 AM   Modules accepted: Orders

## 2023-11-17 NOTE — Telephone Encounter (Signed)
 [phone] [place], [place] - [address]  sertraline  (ZOLOFT ) 25 MG tablet  amphetamine -dextroamphetamine  (ADDERALL) 15 MG tablet   Next Appt  10/03/23 Last Appt  08/01/23

## 2023-12-13 ENCOUNTER — Ambulatory Visit (INDEPENDENT_AMBULATORY_CARE_PROVIDER_SITE_OTHER)

## 2023-12-13 DIAGNOSIS — J309 Allergic rhinitis, unspecified: Secondary | ICD-10-CM | POA: Diagnosis not present

## 2023-12-27 ENCOUNTER — Telehealth (HOSPITAL_COMMUNITY): Payer: Self-pay | Admitting: *Deleted

## 2023-12-27 MED ORDER — SERTRALINE HCL 25 MG PO TABS: 25.0000 mg | ORAL_TABLET | Freq: Every day | ORAL | 0 refills | Status: DC

## 2023-12-27 MED ORDER — SERTRALINE HCL 100 MG PO TABS: 100.0000 mg | ORAL_TABLET | Freq: Every day | ORAL | 0 refills | Status: DC

## 2023-12-27 NOTE — Addendum Note (Signed)
 Addended by: Igor Bishop on: 12/27/2023 01:52 PM   Modules accepted: Orders

## 2023-12-27 NOTE — Telephone Encounter (Signed)
 [phone] [place],  - [address]  sertraline  (ZOLOFT ) 25 MG tablet   Next Appt  01/06/24 Last Appt  10/03/23

## 2023-12-27 NOTE — Telephone Encounter (Signed)
 sertraline  (ZOLOFT ) 25 MG tablet     #90 sent on 11/17/23

## 2023-12-27 NOTE — Telephone Encounter (Signed)
 Patient just called Requested  Refill  amphetamine -dextroamphetamine   (ADDERALL) 15 MG tablet 11/17/23   #90 5/222/25

## 2023-12-28 MED ORDER — AMPHETAMINE-DEXTROAMPHETAMINE 15 MG PO TABS: 15.0000 mg | ORAL_TABLET | Freq: Every day | ORAL | 0 refills | Status: DC

## 2023-12-28 NOTE — Addendum Note (Signed)
 Addended by: GERALENE KAISER on: 12/28/2023 09:09 AM   Modules accepted: Orders

## 2024-01-04 ENCOUNTER — Other Ambulatory Visit: Payer: Self-pay | Admitting: Family Medicine

## 2024-01-05 ENCOUNTER — Other Ambulatory Visit: Payer: Self-pay | Admitting: Family Medicine

## 2024-01-05 ENCOUNTER — Ambulatory Visit (INDEPENDENT_AMBULATORY_CARE_PROVIDER_SITE_OTHER)

## 2024-01-05 DIAGNOSIS — J309 Allergic rhinitis, unspecified: Secondary | ICD-10-CM | POA: Diagnosis not present

## 2024-01-05 NOTE — Telephone Encounter (Signed)
 Copied from CRM [id number]. Topic: Clinical - Medication Refill >> Jan 05, 2024  4:00 PM Mercer PEDLAR wrote: Medication: pantoprazole  (PROTONIX ) 40 MG tablet  Has the patient contacted their pharmacy? Yes (Agent: If no, request that the patient contact the pharmacy for the refill. If patient does not wish to contact the pharmacy document the reason why and proceed with request.) (Agent: If yes, when and what did the pharmacy advise?)  This is the patient's preferred pharmacy:  [phone] [place] [place] - [address] [place] [address] Phone: [phone] Fax: [phone]  Is this the correct pharmacy for this prescription? Yes If no, delete pharmacy and type the correct one.   Has the prescription been filled recently? No  Is the patient out of the medication? Yes  Has the patient been seen for an appointment in the last year OR does the patient have an upcoming appointment? Yes  Can we respond through MyChart? Yes  Agent: Please be advised that Rx refills may take up to 3 business days. We ask that you follow-up with your pharmacy.

## 2024-01-06 ENCOUNTER — Telehealth (HOSPITAL_COMMUNITY): Admitting: Psychiatry

## 2024-01-10 ENCOUNTER — Encounter (HOSPITAL_COMMUNITY): Payer: Self-pay | Admitting: Psychiatry

## 2024-01-10 ENCOUNTER — Telehealth (INDEPENDENT_AMBULATORY_CARE_PROVIDER_SITE_OTHER): Admitting: Psychiatry

## 2024-01-10 DIAGNOSIS — F411 Generalized anxiety disorder: Secondary | ICD-10-CM

## 2024-01-10 DIAGNOSIS — F900 Attention-deficit hyperactivity disorder, predominantly inattentive type: Secondary | ICD-10-CM | POA: Diagnosis not present

## 2024-01-10 DIAGNOSIS — F4310 Post-traumatic stress disorder, unspecified: Secondary | ICD-10-CM | POA: Diagnosis not present

## 2024-01-10 DIAGNOSIS — F5102 Adjustment insomnia: Secondary | ICD-10-CM | POA: Diagnosis not present

## 2024-01-10 NOTE — Progress Notes (Signed)
 [facility] Follow up visit  Patient Identification: Barbara Sellers MRN:  [id number] Date of Evaluation:  01/10/2024 Referral Source: primary care Chief Complaint:   Follow up adhd, anxiety   Visit Diagnosis:    ICD-10-CM   1. GAD (generalized anxiety disorder)  F41.1     2. PTSD (post-traumatic stress disorder)  F43.10     3. Adjustment insomnia  F51.02     4. Attention deficit hyperactivity disorder (ADHD), predominantly inattentive type  F90.0     Virtual Visit via Video Note  I connected with Barbara Sellers on 01/10/24 at 10:30 AM EDT by a video enabled telemedicine application and verified that I am speaking with the correct person using two identifiers.  Location: Patient: work Provider: office   I discussed the limitations of evaluation and management by telemedicine and the availability of in person appointments. The patient expressed understanding and agreed to proceed.     I discussed the assessment and treatment plan with the patient. The patient was provided an opportunity to ask questions and all were answered. The patient agreed with the plan and demonstrated an understanding of the instructions.   The patient was advised to call back or seek an in-person evaluation if the symptoms worsen or if the condition fails to improve as anticipated.  I provided 18 minutes of non-face-to-face time during this encounter.    History of Present Illness: Patient is a 33 years old married Caucasian female has worked in the police department  Last visit we have increased the Adderall because of inattention she is managing it better with 50 mg reported side effects are none.  She sleeps well with the trazodone  she continues to work on sleep hygiene and is taking 2 jobs she understands to balance it out  Overall medication keeping balance she does have supportive family and friends she is trying to move away from the divorce stress and focusing on hearing now she is still in therapy  Has cut  down coffee intake   Patient does not no psychotic symptoms does not also endorse manic symptoms  Aggravating factors: traumatic brain injury .  Divorce,  finances Modifying factors; dogs Duration since age 31  Severity ; better  Past Psychiatric History: PTSD, Adhd  Previous Psychotropic Medications: Yes   Substance Abuse History in the last 12 months:  No.  Consequences of Substance Abuse: Drinks one a week, denies concerns  Past Medical History:  Past Medical History:  Diagnosis Date   ADHD (attention deficit hyperactivity disorder)    Asthma    Depression    Eczema    Family history of breast cancer    Family history of melanoma    Family history of pancreatic cancer    Family history of prostate cancer    Migraines    Ocular migraine 10/23/2019   PTSD (post-traumatic stress disorder)    Recurrent upper respiratory infection (URI)    Rheumatoid arthritis (HCC)    Seasonal allergies    Swine flu 2009   Urticaria    Vertigo     Past Surgical History:  Procedure Laterality Date   APPENDECTOMY  1996   lasik Bilateral    ORIF DISTAL RADIUS FRACTURE     WISDOM TOOTH EXTRACTION        Family History:  Family History  Problem Relation Age of Onset   Macular degeneration Mother    Urticaria Mother    Colon polyps Father    Diabetes Father    Diverticulitis Father  Cancer Father    Cancer - Other Father        Adenocarcinoma   Allergic rhinitis Father    Asthma Father    Eczema Father    Allergic rhinitis Brother    Arrhythmia Brother        half-brother   Allergic rhinitis Brother    Diabetes Maternal Aunt    Allergic rhinitis Maternal Aunt    Cancer Maternal Grandmother        SKIN   Cancer Maternal Grandfather        SKIN   Hypertension Paternal Grandmother    Skin cancer Paternal Grandmother    Allergic rhinitis Paternal Grandmother    Asthma Paternal Grandmother    Hypertension Paternal Grandfather    Cancer Paternal Grandfather         melanoma, kidney, bladder, prostate, lung, liver cancer   Skin cancer Paternal Grandfather    Melanoma Paternal Aunt    Allergic rhinitis Paternal Aunt    Asthma Paternal Aunt    Pancreatic cancer Other    Breast cancer Maternal Aunt 59   Allergic rhinitis Maternal Aunt    Pancreatic cancer Maternal Aunt    Allergic rhinitis Maternal Aunt    Allergic rhinitis Maternal Aunt    Asthma Maternal Aunt    Melanoma Paternal Aunt    Allergic rhinitis Paternal Aunt    Asthma Paternal Aunt    Melanoma Other     Social History:   Social History   Socioeconomic History   Marital status: Legally Separated    Spouse name: Not on file   Number of children: Not on file   Years of education: Not on file   Highest education level: Not on file  Occupational History   Occupation: Consulting civil engineer   Occupation: POLICE OFFICER    Employer: [employer]  Tobacco Use   Smoking status: Former    Types: Cigarettes   Smokeless tobacco: Never  Vaping Use   Vaping status: Never Used  Substance and Sexual Activity   Alcohol use: Yes    Alcohol/week: 2.0 standard drinks of alcohol    Types: 2 Standard drinks or equivalent per week    Comment: rare   Drug use: Never   Sexual activity: Yes    Partners: Female    Comment: Same sex marriage  Other Topics Concern   Not on file  Social History Narrative   She exercises at the gym and does kickboxing and soccer.   Social Drivers of Health   Financial Resource Strain: Patient Declined (07/11/2023)   Received from [facility]   Overall Financial Resource Strain (CARDIA)    Difficulty of Paying Living Expenses: Patient declined  Food Insecurity: Patient Declined (07/11/2023)   [facility] from [facility]   Hunger Vital Sign    Within the past 12 months, you worried that your food would run out before you got the money to buy more.: Patient declined    Within the past 12 months, the food you bought just didn't last and you didn't have money to get more.:  Patient declined  Transportation Needs: Patient Declined (07/11/2023)   Received from [facility] - Transportation    Lack of Transportation (Medical): Patient declined    Lack of Transportation (Non-Medical): Patient declined  Physical Activity: Not on file  Stress: Not on file  Social Connections: Unknown (10/26/2021)   [facility]   Social Network    Social Network: Not on file  Allergies:   Allergies  Allergen Reactions   Cayenne Anaphylaxis   Azithromycin Itching and Rash    Also burning.   Also burning. Also burning.   Doxycycline  Other (See Comments) and Swelling    Esophagus issues. felt like razor blades Throat swelling  Other reaction(s): Other (See Comments) Throat swelling Esophagus issues. felt like razor blades Esophagus issues. felt like razor blades Throat swelling   Doxycycline  Calcium Other (See Comments)   Buspar [Buspirone] Anxiety    Rx caused muscle spasms    Metabolic Disorder Labs: No results found for: HGBA1C, MPG No results found for: PROLACTIN Lab Results  Component Value Date   CHOL 203 (H) 07/13/2023   TRIG 62 07/13/2023   HDL 70 07/13/2023   LDLCALC 122 (H) 07/13/2023   Lab Results  Component Value Date   TSH 1.33 10/07/2021    Therapeutic Level Labs: No results found for: LITHIUM No results found for: CBMZ No results found for: VALPROATE  Current Medications: Current Outpatient Medications  Medication Sig Dispense Refill   albuterol  (VENTOLIN  HFA) 108 (90 Base) MCG/ACT inhaler Inhale 2 puffs into the lungs every 6 (six) hours as needed for wheezing or shortness of breath. 8 g 1   amphetamine -dextroamphetamine  (ADDERALL) 15 MG tablet Take 1 tablet by mouth daily. 30 tablet 0   brompheniramine-pseudoephedrine-DM 30-2-10 MG/5ML syrup Take 5 mLs by mouth 4 (four) times daily as needed. 120 mL 0   oseltamivir  (TAMIFLU ) 75 MG capsule Take 1 capsule (75 mg total) by mouth 2 (two)  times daily. 10 capsule 0   pantoprazole  (PROTONIX ) 40 MG tablet TAKE 1 TABLET BY MOUTH EVERY DAY 90 tablet 0   predniSONE  (DELTASONE ) 20 MG tablet Take 2 tablets (40 mg total) by mouth daily with breakfast. 10 tablet 0   sertraline  (ZOLOFT ) 100 MG tablet Take 1 tablet (100 mg total) by mouth daily. 90 tablet 0   sertraline  (ZOLOFT ) 25 MG tablet Take 1 tablet (25 mg total) by mouth daily. 90 tablet 0   traZODone  (DESYREL ) 50 MG tablet Take 1 tablet (50 mg total) by mouth at bedtime. 90 tablet 0   No current facility-administered medications for this visit.     Psychiatric Specialty Exam: Review of Systems  Cardiovascular:  Negative for chest pain.  Neurological:  Negative for tremors.  Psychiatric/Behavioral:  Negative for agitation, hallucinations and self-injury.     There were no vitals taken for this visit.There is no height or weight on file to calculate BMI.  General Appearance: Neat  Eye Contact:  Good  Speech:  Clear and Coherent  Volume:  Normal  Mood: Fair  Affect:  Congruent  Thought Process:  Goal Directed  Orientation:  Full (Time, Place, and Person)  Thought Content:  Rumination  Suicidal Thoughts:  No  Homicidal Thoughts:  No  Memory:  Immediate;   Fair  Judgement:  Fair  Insight:  Fair  Psychomotor Activity:  Normal  Concentration:  Concentration: Fair  Recall:  Fair  Fund of Knowledge:Good  Language: Good  Akathisia:  No  Handed:    AIMS (if indicated):  not done  Assets:  Communication Skills Desire for Improvement Housing  ADL's:  Intact  Cognition: WNL  Sleep:  variable   Screenings: GAD-7    Flowsheet Row Office Visit from 10/23/2019 in [facility] Primary Care & Sports Medicine at [facility]  Total GAD-7 Score 11   PHQ2-9    Flowsheet Row Office Visit from 06/07/2023 in [facility] Primary Care &  Sports Medicine at [facility] Office Visit from 09/28/2021 in [facility] Outpatient Behavioral Health at [facility] Office Visit from 10/23/2019 in [facility] Primary Care & Sports Medicine at [facility] Total Score 0 1 4  PHQ-9 Total Score -- -- 20   Flowsheet Row Video Visit from 10/03/2023 in [facility] Outpatient Behavioral Health at [facility] Video Visit from 08/01/2023 in [facility]al Health at [facility] UC from 02/14/2023 in [facility] at [facility] RISK CATEGORY No Risk No Risk No Risk     Assessment and Plan: as follows   Prior documentation reviewed   ADHD; manageable with Adderall 15 mg does not report having headaches or palpitations    generalized anxiety disorder: Regular stressors continue sertraline  and therapy  understands she is is doing 2 jobs and should balance that part, consider therapy  Insomnia; manageable with trazodone  continue sleep hygiene   PTSD; baseline, continue zoloft  and woking in therapy, going thru divorce  Follow-up in 3 to 4 months or earlier if needed medication reviewed and questions addressed refill sent if due   Collaboration of Care: Medication Management AEB med review and referral papers reviewed,   Patient/Guardian was advised Release of Information must be obtained prior to any record release in order to collaborate their care with an outside provider. Patient/Guardian was advised if they have not already done so to contact the registration department to sign all necessary forms in order for us  to release information regarding their care.   Consent: Patient/Guardian gives verbal consent for treatment and assignment of benefits for services provided during this visit. Patient/Guardian expressed understanding and agreed to proceed.   Jackey Flight, MD 7/15/202510:42 AM

## 2024-01-18 ENCOUNTER — Telehealth: Payer: Self-pay | Admitting: Family Medicine

## 2024-01-18 DIAGNOSIS — R300 Dysuria: Secondary | ICD-10-CM

## 2024-01-18 NOTE — Progress Notes (Signed)
  Because of the back pain and blood in your urine I would recommend an in person evaluation. These can be symptoms of a more serious infection in the kidney or a kidney stone so I feel your condition warrants further evaluation and I recommend that you be seen in a face-to-face visit. This will allow for testing of your urine and a physical exam to evaluate if this is a regular urinary tract infection or something else causing your symptoms.    NOTE: There will be NO CHARGE for this E-Visit   If you are having a true medical emergency, please call 911.     For an urgent face to face visit, [place] has multiple urgent care centers for your convenience.  Click the link below for the full list of locations and hours, walk-in wait times, appointment scheduling options and driving directions:  Urgent Care - [place], [place], [place], [place], [place], [place]  [place]     Your MyChart E-visit questionnaire answers were reviewed by a board certified advanced clinical practitioner to complete your personal care plan based on your specific symptoms.    Thank you for using e-Visits.

## 2024-01-19 ENCOUNTER — Ambulatory Visit: Payer: Self-pay | Admitting: Family Medicine

## 2024-01-19 ENCOUNTER — Encounter: Payer: Self-pay | Admitting: Family Medicine

## 2024-01-19 VITALS — BP 112/75 | HR 75 | Ht 61.0 in | Wt 151.0 lb

## 2024-01-19 DIAGNOSIS — M5416 Radiculopathy, lumbar region: Secondary | ICD-10-CM | POA: Insufficient documentation

## 2024-01-19 DIAGNOSIS — R309 Painful micturition, unspecified: Secondary | ICD-10-CM

## 2024-01-19 DIAGNOSIS — R8290 Unspecified abnormal findings in urine: Secondary | ICD-10-CM | POA: Diagnosis not present

## 2024-01-19 DIAGNOSIS — M4317 Spondylolisthesis, lumbosacral region: Secondary | ICD-10-CM

## 2024-01-19 DIAGNOSIS — N12 Tubulo-interstitial nephritis, not specified as acute or chronic: Secondary | ICD-10-CM | POA: Diagnosis not present

## 2024-01-19 LAB — POCT URINALYSIS DIP (CLINITEK)
Bilirubin, UA: NEGATIVE
Glucose, UA: NEGATIVE mg/dL
Ketones, POC UA: NEGATIVE mg/dL
Nitrite, UA: POSITIVE — AB
Spec Grav, UA: 1.01 (ref 1.010–1.025)
Urobilinogen, UA: 0.2 U/dL
pH, UA: 6 (ref 5.0–8.0)

## 2024-01-19 MED ORDER — CEFTRIAXONE SODIUM 1 G IJ SOLR
1.0000 g | Freq: Once | INTRAMUSCULAR | Status: AC
Start: 2024-01-19 — End: 2024-01-19
  Administered 2024-01-19: 1 g via INTRAMUSCULAR

## 2024-01-19 MED ORDER — CIPROFLOXACIN HCL 500 MG PO TABS: 500.0000 mg | ORAL_TABLET | Freq: Two times a day (BID) | ORAL | 0 refills | Status: AC

## 2024-01-19 NOTE — Assessment & Plan Note (Signed)
 UA consistent with UTI.  She does have R CVA tenderness.  Treating as pyelonephritis.  1g Rocephin  given in office today and will continued 7 day course of Cipro .  Red flags reviewed.

## 2024-01-19 NOTE — Progress Notes (Signed)
 Barbara Sellers - 33 y.o. female MRN [id number]  Date of birth: 05-13-1991  Subjective Chief Complaint  Patient presents with   Urinary Tract Infection    HPI Barbara Sellers is a 33 y.o. female here today with complaint of dysuria, urinary frequency and urgency and R flank pain.  Symptoms started several days ago.  She has had some intermittent blood in her urine.  She does not think she has had fever or chills.  Denies nausea or vomiting.   She does have chronic back pain which also seems to be worsening.  Reports previous MRI with spondylolisthesis.  She also reports that surgery was recommended previously but she put this off.  She does have radiation down the legs.  Increased pain and weakness with valsalva, such as sneezing.    ROS:  A comprehensive ROS was completed and negative except as noted per HPI  Allergies  Allergen Reactions   Cayenne Anaphylaxis   Azithromycin Itching and Rash    Also burning.   Also burning. Also burning.   Doxycycline  Other (See Comments) and Swelling    Esophagus issues. felt like razor blades Throat swelling  Other reaction(s): Other (See Comments) Throat swelling Esophagus issues. felt like razor blades Esophagus issues. felt like razor blades Throat swelling   Doxycycline  Calcium Other (See Comments)   Buspar [Buspirone] Anxiety    Rx caused muscle spasms    Past Medical History:  Diagnosis Date   ADHD (attention deficit hyperactivity disorder)    Asthma    Depression    Eczema    Family history of breast cancer    Family history of melanoma    Family history of pancreatic cancer    Family history of prostate cancer    Migraines    Ocular migraine 10/23/2019   PTSD (post-traumatic stress disorder)    Recurrent upper respiratory infection (URI)    Rheumatoid arthritis (HCC)    Seasonal allergies    Swine flu 2009   Urticaria    Vertigo     Past Surgical History:  Procedure Laterality Date   APPENDECTOMY  1996   lasik  Bilateral    ORIF DISTAL RADIUS FRACTURE     WISDOM TOOTH EXTRACTION      Social History   Socioeconomic History   Marital status: Divorced    Spouse name: Not on file   Number of children: Not on file   Years of education: Not on file   Highest education level: Bachelor's degree (e.g., BA, AB, BS)  Occupational History   Occupation: Consulting civil engineer   Occupation: POLICE OFFICER    Employer: [employer]  Tobacco Use   Smoking status: Former    Types: Cigarettes   Smokeless tobacco: Never  Vaping Use   Vaping status: Never Used  Substance and Sexual Activity   Alcohol use: Yes    Alcohol/week: 2.0 standard drinks of alcohol    Types: 2 Standard drinks or equivalent per week    Comment: rare   Drug use: Never   Sexual activity: Yes    Partners: Female    Comment: Same sex marriage  Other Topics Concern   Not on file  Social History Narrative   She exercises at the gym and does kickboxing and soccer.   Social Drivers of Health   Financial Resource Strain: High Risk (01/18/2024)   Overall Financial Resource Strain (CARDIA)    Difficulty of Paying Living Expenses: Very hard  Food Insecurity: Food Insecurity Present (01/18/2024)   Hunger  Vital Sign    Worried About Programme researcher, broadcasting/film/video in the Last Year: Often true    Ran Out of Food in the Last Year: Often true  Transportation Needs: No Transportation Needs (01/18/2024)   PRAPARE - Administrator, Civil Service (Medical): No    Lack of Transportation (Non-Medical): No  Physical Activity: Insufficiently Active (01/18/2024)   Exercise Vital Sign    Days of Exercise per Week: 3 days    Minutes of Exercise per Session: 30 min  Stress: Stress Concern Present (01/18/2024)   [employer] of Occupational Health - Occupational Stress Questionnaire    Feeling of Stress: Very much  Social Connections: Socially Isolated (01/18/2024)   Social Connection and Isolation Panel    Frequency of Communication with Friends and Family:  More than three times a week    Frequency of Social Gatherings with Friends and Family: Once a week    Attends Religious Services: Never    Database administrator or Organizations: No    Attends Engineer, structural: Not on file    Marital Status: Divorced    Family History  Problem Relation Age of Onset   Macular degeneration Mother    Urticaria Mother    Colon polyps Father    Diabetes Father    Diverticulitis Father    Cancer Father    Cancer - Other Father        Adenocarcinoma   Allergic rhinitis Father    Asthma Father    Eczema Father    Allergic rhinitis Brother    Arrhythmia Brother        half-brother   Allergic rhinitis Brother    Diabetes Maternal Aunt    Allergic rhinitis Maternal Aunt    Cancer Maternal Grandmother        SKIN   Cancer Maternal Grandfather        SKIN   Hypertension Paternal Grandmother    Skin cancer Paternal Grandmother    Allergic rhinitis Paternal Grandmother    Asthma Paternal Grandmother    Hypertension Paternal Grandfather    Cancer Paternal Grandfather        melanoma, kidney, bladder, prostate, lung, liver cancer   Skin cancer Paternal Grandfather    Melanoma Paternal Aunt    Allergic rhinitis Paternal Aunt    Asthma Paternal Aunt    Pancreatic cancer Other    Breast cancer Maternal Aunt 59   Allergic rhinitis Maternal Aunt    Pancreatic cancer Maternal Aunt    Allergic rhinitis Maternal Aunt    Allergic rhinitis Maternal Aunt    Asthma Maternal Aunt    Melanoma Paternal Aunt    Allergic rhinitis Paternal Aunt    Asthma Paternal Aunt    Melanoma Other     Health Maintenance  Topic Date Due   HIV Screening  Never done   Hepatitis C Screening  Never done   Hepatitis B Vaccines (1 of 3 - 19+ 3-dose series) Never done   HPV VACCINES (1 - 3-dose SCDM series) Never done   COVID-19 Vaccine (3 - 2024-25 season) 02/27/2023   Pneumococcal Vaccine 79-24 Years old (1 of 2 - PCV) 07/12/2024 (Originally 05/16/2008)    INFLUENZA VACCINE  01/27/2024   Cervical Cancer Screening (HPV/Pap Cotest)  09/09/2025   DTaP/Tdap/Td (2 - Td or Tdap) 04/01/2029   Meningococcal B Vaccine  Aged Out     ----------------------------------------------------------------------------------------------------------------------------------------------------------------------------------------------------------------- Physical Exam BP 112/75 (BP Location: Left Arm, Patient Position: Sitting, Cuff Size:  Normal)   Pulse 75   Ht 5' 1 (1.549 m)   Wt 151 lb (68.5 kg)   SpO2 96%   BMI 28.53 kg/m   Physical Exam Constitutional:      Appearance: Normal appearance.  Eyes:     General: No scleral icterus. Cardiovascular:     Rate and Rhythm: Normal rate and regular rhythm.  Abdominal:     General: There is no distension.     Tenderness: There is right CVA tenderness. There is no left CVA tenderness.  Neurological:     General: No focal deficit present.     Mental Status: She is alert.  Psychiatric:        Mood and Affect: Mood normal.        Behavior: Behavior normal.     ------------------------------------------------------------------------------------------------------------------------------------------------------------------------------------------------------------------- Assessment and Plan  Pyelonephritis UA consistent with UTI.  She does have R CVA tenderness.  Treating as pyelonephritis.  1g Rocephin  given in office today and will continued 7 day course of Cipro .  Red flags reviewed.    Chronic lumbar radiculopathy Updated MRI ordered as she is interested in further intervention for management of this.    Meds ordered this encounter  Medications   ciprofloxacin  (CIPRO ) 500 MG tablet    Sig: Take 1 tablet (500 mg total) by mouth 2 (two) times daily for 7 days.    Dispense:  14 tablet    Refill:  0    No follow-ups on file.

## 2024-01-19 NOTE — Patient Instructions (Signed)
 Pyelonephritis, Adult  Pyelonephritis is an infection in the kidney. The kidneys are the parts of the body that help clean the blood. They move waste out of the blood and into the pee (urine). This infection can happen fast, or it can last for a long time. In most cases, it clears up with treatment and does not cause other problems. What are the causes? This infection may be caused by germs (bacteria). The germs may go: From your bladder up into your kidney. This may happen after you have a bladder infection. From your blood into your kidney. What increases the risk? You are more likely to get this condition if: You are pregnant. You are older. You have: Diabetes. Prostatitis. This is irritation and swelling of the prostate gland. Kidney stones or bladder stones. Other problems with your kidney or the parts of your body that carry pee from the kidneys to the bladder (ureters). Cancer. You have a soft tube called a catheter in your bladder. You are sexually active. You use a medicine that kills sperm (spermicide). This may be used to prevent pregnancy. You have had a urinary tract infection (UTI) before. What are the signs or symptoms? Peeing often. Feeling the need to pee right away. A burning or stinging feeling when you pee. Pain in your belly, back, or side. Fever or chills. Vomiting or feeling like you may vomit. Dark pee or blood in your pee. How is this treated? You may be given antibiotics to take. You will need to drink lots of fluids. If the infection is bad, you may need to stay in the hospital. You may be given antibiotics and fluids through an IV. In some cases, other treatments may be needed. Follow these instructions at home: Eating and drinking Drink enough fluid to keep your pee pale yellow. Avoid caffeine, tea, and drinks that are bubbly (carbonated). General instructions Take over-the-counter and prescription medicines as told by your doctor. Finish your  antibiotics even if you start to feel better. Pee (urinate) often. Do not hold in your pee for long periods of time. Pee before and after you have sex. If you are female, wipe from front to back after you poop (have a bowel movement). Use each tissue only once. Keep all follow-up visits. Your doctor will want to make sure that you are getting better. Contact a doctor if: You do not feel better after 2 days. Your symptoms get worse. You have a fever or chills. You cannot take your medicine. Get help right away if: You vomit each time that you eat or drink. You have very bad pain in your back or side. You are very weak, or you faint. This information is not intended to replace advice given to you by your health care provider. Make sure you discuss any questions you have with your health care provider. Document Revised: 01/04/2022 Document Reviewed: 01/04/2022 Elsevier Patient Education  2024 [employer].

## 2024-01-19 NOTE — Assessment & Plan Note (Signed)
 Updated MRI ordered as she is interested in further intervention for management of this.

## 2024-01-19 NOTE — Addendum Note (Signed)
 Addended by: FRANCHOT ARTA PEDLAR on: 01/19/2024 11:48 AM   Modules accepted: Orders

## 2024-01-23 ENCOUNTER — Ambulatory Visit: Payer: Self-pay | Admitting: Family Medicine

## 2024-01-23 LAB — URINE CULTURE

## 2024-01-23 MED ORDER — CEFDINIR 300 MG PO CAPS: 300.0000 mg | ORAL_CAPSULE | Freq: Two times a day (BID) | ORAL | 0 refills | Status: AC

## 2024-02-07 ENCOUNTER — Ambulatory Visit (INDEPENDENT_AMBULATORY_CARE_PROVIDER_SITE_OTHER)

## 2024-02-07 DIAGNOSIS — J309 Allergic rhinitis, unspecified: Secondary | ICD-10-CM

## 2024-02-23 ENCOUNTER — Other Ambulatory Visit (HOSPITAL_COMMUNITY): Payer: Self-pay | Admitting: *Deleted

## 2024-02-23 DIAGNOSIS — J302 Other seasonal allergic rhinitis: Secondary | ICD-10-CM | POA: Diagnosis not present

## 2024-02-23 NOTE — Telephone Encounter (Signed)
 Barbara Sellers

## 2024-02-23 NOTE — Progress Notes (Signed)
 VIALS MADE 02-23-24

## 2024-02-24 DIAGNOSIS — J3081 Allergic rhinitis due to animal (cat) (dog) hair and dander: Secondary | ICD-10-CM | POA: Diagnosis not present

## 2024-02-24 MED ORDER — AMPHETAMINE-DEXTROAMPHETAMINE 15 MG PO TABS: 15.0000 mg | ORAL_TABLET | Freq: Every day | ORAL | 0 refills | Status: DC

## 2024-02-28 ENCOUNTER — Encounter: Payer: Self-pay | Admitting: Sports Medicine

## 2024-03-13 ENCOUNTER — Ambulatory Visit (INDEPENDENT_AMBULATORY_CARE_PROVIDER_SITE_OTHER)

## 2024-03-13 DIAGNOSIS — J309 Allergic rhinitis, unspecified: Secondary | ICD-10-CM | POA: Diagnosis not present

## 2024-03-23 ENCOUNTER — Other Ambulatory Visit (HOSPITAL_COMMUNITY): Payer: Self-pay | Admitting: Psychiatry

## 2024-03-30 ENCOUNTER — Telehealth (HOSPITAL_COMMUNITY): Payer: Self-pay | Admitting: Psychiatry

## 2024-03-30 MED ORDER — AMPHETAMINE-DEXTROAMPHETAMINE 15 MG PO TABS: 15.0000 mg | ORAL_TABLET | Freq: Every day | ORAL | 0 refills | Status: DC

## 2024-03-30 NOTE — Telephone Encounter (Signed)
 Patient called requesting refill of amphetamine -dextroamphetamine  (ADDERALL) 15 MG tablet .    [id number] - [place], [place] - [address] (Ph: [phone])   Last ordered: 02/24/2024 - 30 tablets Last visit: 01/10/2024 Next visit: 05/09/2024

## 2024-04-10 ENCOUNTER — Other Ambulatory Visit: Payer: Self-pay | Admitting: Family Medicine

## 2024-04-12 ENCOUNTER — Ambulatory Visit (INDEPENDENT_AMBULATORY_CARE_PROVIDER_SITE_OTHER): Admitting: Allergy

## 2024-04-12 ENCOUNTER — Encounter: Payer: Self-pay | Admitting: Allergy

## 2024-04-12 VITALS — BP 120/70 | HR 83 | Temp 97.7°F | Resp 18 | Wt 160.8 lb

## 2024-04-12 DIAGNOSIS — J3089 Other allergic rhinitis: Secondary | ICD-10-CM | POA: Diagnosis not present

## 2024-04-12 DIAGNOSIS — H1010 Acute atopic conjunctivitis, unspecified eye: Secondary | ICD-10-CM

## 2024-04-12 DIAGNOSIS — J302 Other seasonal allergic rhinitis: Secondary | ICD-10-CM | POA: Diagnosis not present

## 2024-04-12 DIAGNOSIS — J4520 Mild intermittent asthma, uncomplicated: Secondary | ICD-10-CM | POA: Diagnosis not present

## 2024-04-12 DIAGNOSIS — T7819XD Other adverse food reactions, not elsewhere classified, subsequent encounter: Secondary | ICD-10-CM

## 2024-04-12 NOTE — Patient Instructions (Addendum)
 Environmental allergies Past skin testing positive to grass, ragweed. Borderline to mold and cat. Continue environmental control measures. Continue allergy  injections - given today.  May use over the counter antihistamines such as Zyrtec (cetirizine), Claritin (loratadine), Allegra (fexofenadine), or Xyzal (levocetirizine) daily as needed. May take twice a day during allergy  flares. May switch antihistamines every few months. Start Ryaltris (olopatadine  + mometasone nasal spray combination) 1-2 sprays per nostril twice a day. Sample given. Let me know if this does not work. We can try a different nasal spray and/or refer to ENT as well.  Try to spray it 30 min before you exercise? If this works well for you then let me know and I'll send in a real prescription to the pharmacy.  Nasal saline spray (i.e., Simply Saline) or nasal saline lavage (i.e., NeilMed) is recommended as needed and prior to medicated nasal sprays.  Asthma: May use albuterol  rescue inhaler 2 puffs every 4 to 6 hours as needed for shortness of breath, chest tightness, coughing, and wheezing. May use albuterol  rescue inhaler 2 puffs 5 to 15 minutes prior to strenuous physical activities. Monitor frequency of use.  Asthma control goals:  Full participation in all desired activities (may need albuterol  before activity) Albuterol  use two times or less a week on average (not counting use with activity) Cough interfering with sleep two times or less a month Oral steroids no more than once a year No hospitalizations  Food allergy : Continue strict avoidance of cayenne pepper. For mild symptoms you can take over the counter antihistamines (zyrtec 10mg  to 20mg ) and monitor symptoms closely.  If symptoms worsen or if you have severe symptoms including breathing issues, throat closure, significant swelling, whole body hives, severe diarrhea and vomiting, lightheadedness then use epinephrine and seek immediate medical care  afterwards. Emergency action plan in place.   Follow up in 12 months or sooner if needed.

## 2024-04-12 NOTE — Progress Notes (Signed)
 Follow Up Note  RE: Barbara Sellers MRN: [id number] DOB: 01-23-1991 Date of Office Visit: 04/12/2024  Referring provider: Alvia Bring, DO Primary care provider: Alvia Bring, DO  Chief Complaint: Follow-up (Follow up on injections due today. No refills needed.)  History of Present Illness: I had the pleasure of seeing Barbara Sellers for a follow up visit at the [facility] on 04/12/2024. She is a 33 y.o. female, who is being followed for asthma, allergic rhinoconjunctivitis on AIT, adverse food reaction and drug reaction. Her previous allergy  office visit was on 01/04/2023 with Dr. Luke. Today is a regular follow up visit.  Discussed the use of AI scribe software for clinical note transcription with the patient, who gave verbal consent to proceed.    She experiences uncontrollable sneezing episodes after exercising, lasting about 30 minutes, regardless of whether she exercises indoors or outdoors. The sneezing is more intense than her usual allergy  symptoms, which typically involve only a few sneezes. She has tried different locations to rule out environmental factors like mold, but the sneezing persists.  In addition to sneezing, she experiences nasal drainage and congestion post-exercise. She has attempted to alleviate these symptoms with saline nasal spray, but it has not been effective. She exercises approximately three times a week, either at work or at home.  She has a history of allergies and has been on allergy  shots for almost four years, which have significantly improved her year-round allergy  symptoms. Previously, she suffered from constant nasal congestion and loss of smell, but now she can smell things again. She occasionally takes an allergy  pill, especially during ragweed season, but does not take it regularly.  She has a known allergy  to cayenne pepper, which previously caused anaphylactic reactions. She has been gradually exposing herself to very small amounts  to reduce the severity of her reactions, which now result in mild lip tingling and headaches rather than severe reactions. She carries an EpiPen with her at all times.  She has a history of epistaxis, which she attributes to stress or changes in altitude rather than allergies. She has a history of epistaxis and has been told she has numerous blood vessels in her nose. She also reports a history of nasal injuries from martial arts and sports.     Assessment and Plan: Barbara Sellers is a 33 y.o. female with: Seasonal and perennial allergic rhinoconjunctivitis Past history - Perennial rhino conjunctivitis symptoms for 20+ years with worsening in the spring and summer. Other triggers include cats. 2015 skin testing in the past positive to multiple pollens, cat, mold per patient report. On allergy  injections in the 1990s with good benefit. 2021 skin testing positive to grass, ragweed. Borderline to mold and cat. Started AIT on 12/20/2019 (G-RW and M-C).  Interim history - doing well and AIT is helping. Has some post exercise rhinitis symptoms for 30 min.  Continue environmental control measures. Continue allergy  injections - given today.  May use over the counter antihistamines such as Zyrtec (cetirizine), Claritin (loratadine), Allegra (fexofenadine), or Xyzal (levocetirizine) daily as needed. May take twice a day during allergy  flares. May switch antihistamines every few months. Start Ryaltris (olopatadine  + mometasone nasal spray combination) 1-2 sprays per nostril twice a day. Sample given. Let me know if this does not work. We can try a different nasal spray and/or refer to ENT as well.  Try to spray it 30 min before you exercise? If this works well for you then let me know and I'll send  in a real prescription to the pharmacy.  Nasal saline spray (i.e., Simply Saline) or nasal saline lavage (i.e., NeilMed) is recommended as needed and prior to medicated nasal sprays.  Mild intermittent asthma without  complication Past history - Diagnosed with asthma as a child and was doing well up until recently. She had COVID-19 in January 2021. Recently started on Incruse and using albuterol  less but was using it multiple times per day. Prednisone  did not help. CXR on 07/11/2019 - Small focus of airspace disease in the right lung base. 2021 spirometry was normal with no improvement in FEV1 post bronchodilator treatment. Clinically feeling the same.  Interim history - asymptomatic. Only using albuterol  during infections.  May use albuterol  rescue inhaler 2 puffs every 4 to 6 hours as needed for shortness of breath, chest tightness, coughing, and wheezing. May use albuterol  rescue inhaler 2 puffs 5 to 15 minutes prior to strenuous physical activities. Monitor frequency of use.    Adverse food reaction Past history - Anaphylactic reaction to cayenne pepper in the past. Tolerates other peppers including jalapenos with no issues. Last testing done in 2015. Interim history - Previously anaphylactic to cayenne pepper, now mild symptoms with small exposures.  Continue strict avoidance of cayenne pepper. For mild symptoms you can take over the counter antihistamines (zyrtec 10mg  to 20mg ) and monitor symptoms closely.  If symptoms worsen or if you have severe symptoms including breathing issues, throat closure, significant swelling, whole body hives, severe diarrhea and vomiting, lightheadedness then use epinephrine and seek immediate medical care afterwards. Emergency action plan in place.   Return in about 1 year (around 04/12/2025).  No orders of the defined types were placed in this encounter.  Lab Orders  No laboratory test(s) ordered today    Diagnostics: None.   Medication List:  Current Outpatient Medications  Medication Sig Dispense Refill   albuterol  (VENTOLIN  HFA) 108 (90 Base) MCG/ACT inhaler Inhale 2 puffs into the lungs every 6 (six) hours as needed for wheezing or shortness of breath. 8 g 1    amphetamine -dextroamphetamine  (ADDERALL) 15 MG tablet Take 1 tablet by mouth daily. 30 tablet 0   pantoprazole  (PROTONIX ) 40 MG tablet TAKE 1 TABLET BY MOUTH EVERY DAY 90 tablet 0   sertraline  (ZOLOFT ) 100 MG tablet TAKE 1 TABLET BY MOUTH EVERY DAY 90 tablet 0   sertraline  (ZOLOFT ) 25 MG tablet Take 1 tablet (25 mg total) by mouth daily. 90 tablet 0   traZODone  (DESYREL ) 50 MG tablet Take 1 tablet (50 mg total) by mouth at bedtime. 90 tablet 0   No current facility-administered medications for this visit.   Allergies: Allergies  Allergen Reactions   Cayenne Anaphylaxis   Azithromycin Itching and Rash    Also burning.   Also burning. Also burning.   Doxycycline  Other (See Comments) and Swelling    Esophagus issues. felt like razor blades Throat swelling  Other reaction(s): Other (See Comments) Throat swelling Esophagus issues. felt like razor blades Esophagus issues. felt like razor blades Throat swelling   Doxycycline  Calcium Other (See Comments)   Buspar [Buspirone] Anxiety    Rx caused muscle spasms   I reviewed her past medical history, social history, family history, and environmental history and no significant changes have been reported from her previous visit.  Review of Systems  Constitutional:  Negative for appetite change, chills, fever and unexpected weight change.  HENT:  Negative for congestion, rhinorrhea and sneezing.   Eyes:  Negative for itching.  Respiratory:  Negative  for cough, chest tightness, shortness of breath and wheezing.   Cardiovascular:  Negative for chest pain.  Gastrointestinal:  Negative for abdominal pain.  Genitourinary:  Negative for difficulty urinating.  Skin:  Negative for rash.  Allergic/Immunologic: Positive for environmental allergies and food allergies.    Objective: BP 120/70   Pulse 83   Temp 97.7 F (36.5 C) (Temporal)   Resp 18   Wt 160 lb 12.8 oz (72.9 kg)   SpO2 97%   BMI 30.38 kg/m  Body mass index is 30.38  kg/m. Physical Exam Vitals and nursing note reviewed.  Constitutional:      Appearance: She is well-developed.  HENT:     Head: Normocephalic and atraumatic.     Right Ear: External ear normal.     Left Ear: External ear normal.     Nose: Nose normal.     Mouth/Throat:     Mouth: Mucous membranes are moist.     Pharynx: Oropharynx is clear.  Eyes:     Conjunctiva/sclera: Conjunctivae normal.  Cardiovascular:     Rate and Rhythm: Normal rate and regular rhythm.     Heart sounds: Normal heart sounds. No murmur heard.    No friction rub. No gallop.  Pulmonary:     Effort: Pulmonary effort is normal.     Breath sounds: Normal breath sounds. No wheezing or rales.  Musculoskeletal:     Cervical back: Neck supple.  Skin:    General: Skin is warm.     Findings: No rash.  Neurological:     Mental Status: She is alert and oriented to person, place, and time.  Psychiatric:        Behavior: Behavior normal.    Previous notes and tests were reviewed. The plan was reviewed with the patient/family, and all questions/concerned were addressed.  It was my pleasure to see Barbara Sellers today and participate in her care. Please feel free to contact me with any questions or concerns.  Sincerely,  Orlan Cramp, DO Allergy  & Immunology  [facility]  [place] office: [phone] [facility] office: [phone]

## 2024-05-09 ENCOUNTER — Telehealth (INDEPENDENT_AMBULATORY_CARE_PROVIDER_SITE_OTHER): Admitting: Psychiatry

## 2024-05-09 ENCOUNTER — Encounter (HOSPITAL_COMMUNITY): Payer: Self-pay | Admitting: Psychiatry

## 2024-05-09 DIAGNOSIS — F4310 Post-traumatic stress disorder, unspecified: Secondary | ICD-10-CM

## 2024-05-09 DIAGNOSIS — F5102 Adjustment insomnia: Secondary | ICD-10-CM

## 2024-05-09 DIAGNOSIS — F900 Attention-deficit hyperactivity disorder, predominantly inattentive type: Secondary | ICD-10-CM

## 2024-05-09 DIAGNOSIS — F411 Generalized anxiety disorder: Secondary | ICD-10-CM

## 2024-05-09 MED ORDER — TRAZODONE HCL 50 MG PO TABS: 50.0000 mg | ORAL_TABLET | Freq: Every day | ORAL | 0 refills | Status: AC

## 2024-05-09 MED ORDER — AMPHETAMINE-DEXTROAMPHETAMINE 15 MG PO TABS: 15.0000 mg | ORAL_TABLET | Freq: Every day | ORAL | 0 refills | Status: AC

## 2024-05-09 NOTE — Progress Notes (Signed)
 [facility] Follow up visit  Patient Identification: Barbara Sellers MRN:  [id number] Date of Evaluation:  05/09/2024 Referral Source: primary care Chief Complaint:   Follow up adhd, anxiety   Visit Diagnosis:    ICD-10-CM   1. GAD (generalized anxiety disorder)  F41.1     2. PTSD (post-traumatic stress disorder)  F43.10 traZODone  (DESYREL ) 50 MG tablet    3. Adjustment insomnia  F51.02     4. Attention deficit hyperactivity disorder (ADHD), predominantly inattentive type  F90.0     Virtual Visit via Video Note  I connected with Barbara Sellers on 05/09/24 at 10:00 AM EST by a video enabled telemedicine application and verified that I am speaking with the correct person using two identifiers.  Location: Patient: work Provider: home offie   I discussed the limitations of evaluation and management by telemedicine and the availability of in person appointments. The patient expressed understanding and agreed to proceed.      I discussed the assessment and treatment plan with the patient. The patient was provided an opportunity to ask questions and all were answered. The patient agreed with the plan and demonstrated an understanding of the instructions.   The patient was advised to call back or seek an in-person evaluation if the symptoms worsen or if the condition fails to improve as anticipated.  I provided 18 minutes of non-face-to-face time during this encounter.     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  She forgot taking Adderall for a few weeks and felt she was off focus and not productive at work she did pick it up and is now back on it.  She does take drug holidays  Her sertraline  was 125 mg she forgot to pick up the 25 mg as well and she felt better while on the 100 mg she wants to lower the dose back to 100  mg for her anxiety  Has cut down coffee intake   Patient does not no psychotic symptoms does not also endorse manic symptoms  Aggravating factors: traumatic brain injury .  Divorce, finances Modifying factors; dogs Duration since age 67  Severity ; manageable  Past Psychiatric History: PTSD, Adhd  Previous Psychotropic Medications: Yes   Substance Abuse History in the last 12 months:  No.  Consequences of Substance Abuse: Drinks one a week, denies concerns  Past Medical History:  Past Medical History:  Diagnosis Date   ADHD (attention deficit hyperactivity disorder)    Asthma    Depression    Eczema    Family history of breast cancer    Family history of melanoma    Family history of pancreatic cancer    Family history of prostate cancer    Migraines    Ocular migraine 10/23/2019   PTSD (post-traumatic stress disorder)    Recurrent upper respiratory infection (URI)    Rheumatoid arthritis (HCC)    Seasonal allergies    Swine flu 2009   Urticaria    Vertigo     Past Surgical History:  Procedure Laterality Date   APPENDECTOMY  1996   lasik Bilateral    ORIF DISTAL RADIUS FRACTURE     WISDOM TOOTH EXTRACTION        Family History:  Family History  Problem Relation Age of Onset   Macular degeneration Mother  Urticaria Mother    Colon polyps Father    Diabetes Father    Diverticulitis Father    Cancer Father    Cancer - Other Father        Adenocarcinoma   Allergic rhinitis Father    Asthma Father    Eczema Father    Allergic rhinitis Brother    Arrhythmia Brother        half-brother   Allergic rhinitis Brother    Diabetes Maternal Aunt    Allergic rhinitis Maternal Aunt    Cancer Maternal Grandmother        SKIN   Cancer Maternal Grandfather        SKIN   Hypertension Paternal Grandmother    Skin cancer Paternal Grandmother    Allergic rhinitis Paternal Grandmother    Asthma Paternal Grandmother    Hypertension Paternal Grandfather     Cancer Paternal Grandfather        melanoma, kidney, bladder, prostate, lung, liver cancer   Skin cancer Paternal Grandfather    Melanoma Paternal Aunt    Allergic rhinitis Paternal Aunt    Asthma Paternal Aunt    Pancreatic cancer Other    Breast cancer Maternal Aunt 59   Allergic rhinitis Maternal Aunt    Pancreatic cancer Maternal Aunt    Allergic rhinitis Maternal Aunt    Allergic rhinitis Maternal Aunt    Asthma Maternal Aunt    Melanoma Paternal Aunt    Allergic rhinitis Paternal Aunt    Asthma Paternal Aunt    Melanoma Other     Social History:   Social History   Socioeconomic History   Marital status: Divorced    Spouse name: Not on file   Number of children: Not on file   Years of education: Not on file   Highest education level: Bachelor's degree (e.g., BA, AB, BS)  Occupational History   Occupation: consulting civil engineer   Occupation: POLICE OFFICER    Employer: [employer]  Tobacco Use   Smoking status: Former    Types: Cigarettes   Smokeless tobacco: Never  Vaping Use   Vaping status: Never Used  Substance and Sexual Activity   Alcohol use: Yes    Alcohol/week: 2.0 standard drinks of alcohol    Types: 2 Standard drinks or equivalent per week    Comment: rare   Drug use: Never   Sexual activity: Yes    Partners: Female    Comment: Same sex marriage  Other Topics Concern   Not on file  Social History Narrative   She exercises at the gym and does kickboxing and soccer.   Social Drivers of Health   Financial Resource Strain: High Risk (01/18/2024)   Overall Financial Resource Strain (CARDIA)    Difficulty of Paying Living Expenses: Very hard  Food Insecurity: Food Insecurity Present (01/18/2024)   Hunger Vital Sign    Worried About Running Out of Food in the Last Year: Often true    Ran Out of Food in the Last Year: Often true  Transportation Needs: No Transportation Needs (01/18/2024)   PRAPARE - Administrator, Civil Service (Medical): No    Lack  of Transportation (Non-Medical): No  Physical Activity: Insufficiently Active (01/18/2024)   Exercise Vital Sign    Days of Exercise per Week: 3 days    Minutes of Exercise per Session: 30 min  Stress: Stress Concern Present (01/18/2024)   [employer] of Occupational Health - Occupational Stress Questionnaire    Feeling of  Stress: Very much  Social Connections: Socially Isolated (01/18/2024)   Social Connection and Isolation Panel    Frequency of Communication with Friends and Family: More than three times a week    Frequency of Social Gatherings with Friends and Family: Once a week    Attends Religious Services: Never    Database Administrator or Organizations: No    Attends Engineer, Structural: Not on file    Marital Status: Divorced     Allergies:   Allergies  Allergen Reactions   Cayenne Anaphylaxis   Azithromycin Itching and Rash    Also burning.   Also burning. Also burning.   Doxycycline  Other (See Comments) and Swelling    Esophagus issues. felt like razor blades Throat swelling  Other reaction(s): Other (See Comments) Throat swelling Esophagus issues. felt like razor blades Esophagus issues. felt like razor blades Throat swelling   Doxycycline  Calcium Other (See Comments)   Buspar [Buspirone] Anxiety    Rx caused muscle spasms    Metabolic Disorder Labs: No results found for: HGBA1C, MPG No results found for: PROLACTIN Lab Results  Component Value Date   CHOL 203 (H) 07/13/2023   TRIG 62 07/13/2023   HDL 70 07/13/2023   LDLCALC 122 (H) 07/13/2023   Lab Results  Component Value Date   TSH 1.33 10/07/2021    Therapeutic Level Labs: No results found for: LITHIUM No results found for: CBMZ No results found for: VALPROATE  Current Medications: Current Outpatient Medications  Medication Sig Dispense Refill   albuterol  (VENTOLIN  HFA) 108 (90 Base) MCG/ACT inhaler Inhale 2 puffs into the lungs every 6 (six) hours as  needed for wheezing or shortness of breath. 8 g 1   amphetamine -dextroamphetamine  (ADDERALL) 15 MG tablet Take 1 tablet by mouth daily. 30 tablet 0   pantoprazole  (PROTONIX ) 40 MG tablet TAKE 1 TABLET BY MOUTH EVERY DAY 90 tablet 0   sertraline  (ZOLOFT ) 100 MG tablet TAKE 1 TABLET BY MOUTH EVERY DAY 90 tablet 0   traZODone  (DESYREL ) 50 MG tablet Take 1 tablet (50 mg total) by mouth at bedtime. 90 tablet 0   No current facility-administered medications for this visit.     Psychiatric Specialty Exam: Review of Systems  Cardiovascular:  Negative for chest pain.  Neurological:  Negative for tremors.  Psychiatric/Behavioral:  Negative for agitation, hallucinations and self-injury.     There were no vitals taken for this visit.There is no height or weight on file to calculate BMI.  General Appearance: Neat  Eye Contact:  Good  Speech:  Clear and Coherent  Volume:  Normal  Mood: Fair  Affect:  Congruent  Thought Process:  Goal Directed  Orientation:  Full (Time, Place, and Person)  Thought Content:  Rumination  Suicidal Thoughts:  No  Homicidal Thoughts:  No  Memory:  Immediate;   Fair  Judgement:  Fair  Insight:  Fair  Psychomotor Activity:  Normal  Concentration:  Concentration: Fair  Recall:  Fair  Fund of Knowledge:Good  Language: Good  Akathisia:  No  Handed:    AIMS (if indicated):  not done  Assets:  Communication Skills Desire for Improvement Housing  ADL's:  Intact  Cognition: WNL  Sleep:  variable   Screenings: AUDIT    Flowsheet Row Office Visit from 01/19/2024 in [facility] at [facility]  Alcohol Use Disorder Identification Test Final Score (AUDIT) 3    GAD-7    Flowsheet Row Office Visit from 10/23/2019  in [facility] Primary Care & Sports Medicine at [facility]  Total GAD-7 Score 11   PHQ2-9    Flowsheet Row Office Visit from 06/07/2023 in [facility] Primary Care & Sports Medicine at [facility] Office Visit from 09/28/2021 in [facility] at [facility] Office Visit from 10/23/2019 in [facility] Primary Care & Sports Medicine at [facility]  PHQ-2 Total Score 0 1 4  PHQ-9 Total Score -- -- 20   Flowsheet Row Video Visit from 10/03/2023 in [facility] Outpatient Behavioral Health at [facility] Video Visit from 08/01/2023 in [facility] Outpatient Behavioral Health at [facility] from 02/14/2023 in [facility] Urgent Care at [facility] RISK CATEGORY No Risk No Risk No Risk     Assessment and Plan: as follows  Prior documentation reviewed   ADHD; manageable on Adderall 50 mg she does take drug holidays she feels nonproductive when she does not take the medication does not report having any headaches or insomnia  generalized anxiety disorder: Regular stressors sertraline  does help she wants to lower it to 100 mg we will discontinue the 25 mg sertraline     insomnia; manageable sleep hygiene and trazodone  25 to 50 mg refills will be sent if due PTSD; baseline, continue zoloft  and woking in therapy, going thru divorce Follow-up in 4 to 6 months renewed medication questions addressed   Collaboration of Care: Medication Management AEB med review and referral papers reviewed,   Patient/Guardian was advised Release of Information must be obtained prior to any record release in order to collaborate their care with an outside provider. Patient/Guardian was advised if they have not already done so to contact the registration department to sign all necessary forms in order for us  to release information regarding their care.   Consent: Patient/Guardian gives verbal consent for treatment and assignment of benefits for services provided during this visit. Patient/Guardian expressed understanding and agreed to proceed.   Jackey Flight, MD 11/12/202510:14 AM

## 2024-05-22 ENCOUNTER — Ambulatory Visit

## 2024-05-22 DIAGNOSIS — J309 Allergic rhinitis, unspecified: Secondary | ICD-10-CM | POA: Diagnosis not present

## 2024-05-22 MED ORDER — RYALTRIS 665-25 MCG/ACT NA SUSP: 2.0000 | Freq: Two times a day (BID) | NASAL | 2 refills | Status: AC

## 2024-05-29 ENCOUNTER — Ambulatory Visit

## 2024-05-29 DIAGNOSIS — J309 Allergic rhinitis, unspecified: Secondary | ICD-10-CM | POA: Diagnosis not present

## 2024-06-07 ENCOUNTER — Ambulatory Visit (INDEPENDENT_AMBULATORY_CARE_PROVIDER_SITE_OTHER)

## 2024-06-07 DIAGNOSIS — J309 Allergic rhinitis, unspecified: Secondary | ICD-10-CM | POA: Diagnosis not present

## 2024-07-05 ENCOUNTER — Ambulatory Visit (INDEPENDENT_AMBULATORY_CARE_PROVIDER_SITE_OTHER)

## 2024-07-05 DIAGNOSIS — J302 Other seasonal allergic rhinitis: Secondary | ICD-10-CM | POA: Diagnosis not present

## 2024-07-07 ENCOUNTER — Other Ambulatory Visit: Payer: Self-pay | Admitting: Family Medicine

## 2024-07-09 ENCOUNTER — Telehealth (HOSPITAL_COMMUNITY): Payer: Self-pay | Admitting: Psychiatry

## 2024-07-09 MED ORDER — SERTRALINE HCL 100 MG PO TABS: 100.0000 mg | ORAL_TABLET | Freq: Every day | ORAL | 0 refills | Status: AC

## 2024-07-09 NOTE — Telephone Encounter (Signed)
 Received fax from patient's pharmacy requesting refill of sertraline  (ZOLOFT ) 100 MG tablet .   [id number] - , [place] - [address] (Ph: [phone])   Last ordered: 03/23/2024 - 90 tablets Last visit: 05/09/2024 Next visit: 11/07/2024

## 2024-07-11 NOTE — Telephone Encounter (Signed)
 Requesting rx rf of Pantoprazole  40mg   Last written 04/11/2024 Last OV 01/19/2024 Upcoming appt = none

## 2024-07-18 ENCOUNTER — Ambulatory Visit: Admitting: Obstetrics & Gynecology

## 2024-07-18 ENCOUNTER — Encounter: Payer: Self-pay | Admitting: Obstetrics & Gynecology

## 2024-07-18 VITALS — BP 118/85 | HR 80 | Ht 61.0 in | Wt 159.0 lb

## 2024-07-18 DIAGNOSIS — R102 Pelvic and perineal pain unspecified side: Secondary | ICD-10-CM | POA: Diagnosis not present

## 2024-07-18 DIAGNOSIS — G8929 Other chronic pain: Secondary | ICD-10-CM

## 2024-07-18 NOTE — Progress Notes (Signed)
 "  GYNECOLOGY OFFICE VISIT NOTE  History:  Barbara Sellers is a 34 y.o. G0P0 here today for discussion about chronic pelvic pain.  Reports bilateral lower pelvic pain for months, sometimes R > L, sometimes radiating to the back.  History of physiologic ovarian cysts in the past, these were seen during CT scan done in 01/2024 for back pin (bilateral physiologic cysts < 2 cm). Not alleviated much with Ibuprofen or Midol.  Not related to menses, bowel habits or urinary habits. Reports that her recent period came a week earlier, but her other periods were regular. Mother has a history of endometriosis and ovarian cyst rupture. She denies any abnormal vaginal discharge,or other concerns.  Past Medical History:  Diagnosis Date   ADHD (attention deficit hyperactivity disorder)    Asthma    Depression    Eczema    Family history of breast cancer    Family history of melanoma    Family history of pancreatic cancer    Family history of prostate cancer    Migraines    Ocular migraine 10/23/2019   PTSD (post-traumatic stress disorder)    Recurrent upper respiratory infection (URI)    Rheumatoid arthritis (HCC)    Seasonal allergies    Swine flu 2009   Urticaria    Vertigo     Past Surgical History:  Procedure Laterality Date   APPENDECTOMY  1996   CHOLECYSTECTOMY  10/2022   lasik Bilateral    ORIF DISTAL RADIUS FRACTURE     WISDOM TOOTH EXTRACTION      The following portions of the patient's history were reviewed and updated as appropriate: allergies, current medications, past family history, past medical history, past social history, past surgical history and problem list.   Health Maintenance:  Normal pap and negative HRHPV on 09/09/2020.   Review of Systems:  Pertinent items noted in HPI and remainder of comprehensive ROS otherwise negative.  Physical Exam:  BP 118/85 (BP Location: Right Arm, Patient Position: Sitting, Cuff Size: Normal)   Pulse 80   Ht 5' 1 (1.549 m)   Wt 159 lb  (72.1 kg)   LMP 07/18/2023 (Exact Date)   BMI 30.04 kg/m  CONSTITUTIONAL: Well-developed, well-nourished female in no acute distress.  HEENT:  Normocephalic, atraumatic. External right and left ear normal. No scleral icterus.  NECK: Normal range of motion, supple, no masses noted on observation SKIN: No rash noted. Not diaphoretic. No erythema. No pallor. MUSCULOSKELETAL: Normal range of motion. No edema noted. NEUROLOGIC: Alert and oriented to person, place, and time. Normal muscle tone coordination. No cranial nerve deficit noted on observation. PSYCHIATRIC: Normal mood and affect. Normal behavior. Normal judgment and thought content. CARDIOVASCULAR: Normal heart rate noted RESPIRATORY: Effort and breath sounds normal, no problems with respiration noted ABDOMEN: Bilateral moderate tenderness in lower pelvis.  No masses or other overt distention noted on observation.  PELVIC: Deferred  Assessment and Plan:    1. Chronic female pelvic pain (Primary) Discussed possible multiple etiologies for pelvic pain. Will start evaluation with pelvic ultrasound for now. Offered presumptive trial of low does OCPs, she declined for now. Advised to take Ibuprofen +Tylenol  together to see if this helps her pain better - US  PELVIC COMPLETE WITH TRANSVAGINAL; Future  Return in about 3 weeks (around 08/08/2024) for Follow up pelvic pain  and review ultrasound.    I spent 45 minutes dedicated to the care of this patient including pre-visit review of records, face to face time with the patient discussing her  conditions and treatments, post visit ordering of medications and appropriate tests or procedures, coordinating care and documenting this visit encounter.    GLORIS HUGGER, MD, FACOG Obstetrician & Gynecologist, [facility] for [employer], [facility]       "

## 2024-07-30 ENCOUNTER — Ambulatory Visit

## 2024-07-30 DIAGNOSIS — R102 Pelvic and perineal pain unspecified side: Secondary | ICD-10-CM | POA: Diagnosis not present

## 2024-07-30 DIAGNOSIS — G8929 Other chronic pain: Secondary | ICD-10-CM

## 2024-08-01 ENCOUNTER — Ambulatory Visit: Payer: Self-pay | Admitting: Obstetrics & Gynecology

## 2024-08-08 ENCOUNTER — Telehealth: Admitting: Obstetrics and Gynecology

## 2024-11-07 ENCOUNTER — Telehealth (HOSPITAL_COMMUNITY): Admitting: Psychiatry
